# Patient Record
Sex: Female | Born: 1945 | Race: Black or African American | Hispanic: No | Smoking: Never smoker
Health system: Southern US, Community
[De-identification: ages and names within clinical notes are randomized; demographics above are authoritative.]

## PROBLEM LIST (undated history)

## (undated) DIAGNOSIS — I773 Arterial fibromuscular dysplasia: Secondary | ICD-10-CM

## (undated) DIAGNOSIS — I493 Ventricular premature depolarization: Secondary | ICD-10-CM

## (undated) DIAGNOSIS — I351 Nonrheumatic aortic (valve) insufficiency: Secondary | ICD-10-CM

## (undated) DIAGNOSIS — I071 Rheumatic tricuspid insufficiency: Secondary | ICD-10-CM

## (undated) DIAGNOSIS — K219 Gastro-esophageal reflux disease without esophagitis: Secondary | ICD-10-CM

## (undated) DIAGNOSIS — N183 Chronic kidney disease, stage 3 unspecified: Secondary | ICD-10-CM

## (undated) DIAGNOSIS — I10 Essential (primary) hypertension: Secondary | ICD-10-CM

## (undated) DIAGNOSIS — I2510 Atherosclerotic heart disease of native coronary artery without angina pectoris: Secondary | ICD-10-CM

## (undated) DIAGNOSIS — I7771 Dissection of carotid artery: Secondary | ICD-10-CM

## (undated) DIAGNOSIS — E785 Hyperlipidemia, unspecified: Secondary | ICD-10-CM

## (undated) DIAGNOSIS — I491 Atrial premature depolarization: Secondary | ICD-10-CM

## (undated) DIAGNOSIS — R011 Cardiac murmur, unspecified: Secondary | ICD-10-CM

## (undated) DIAGNOSIS — K648 Other hemorrhoids: Secondary | ICD-10-CM

## (undated) DIAGNOSIS — R600 Localized edema: Secondary | ICD-10-CM

## (undated) DIAGNOSIS — D709 Neutropenia, unspecified: Secondary | ICD-10-CM

## (undated) DIAGNOSIS — E669 Obesity, unspecified: Secondary | ICD-10-CM

## (undated) DIAGNOSIS — R0602 Shortness of breath: Secondary | ICD-10-CM

## (undated) DIAGNOSIS — R51 Headache: Secondary | ICD-10-CM

## (undated) DIAGNOSIS — I639 Cerebral infarction, unspecified: Secondary | ICD-10-CM

## (undated) DIAGNOSIS — I340 Nonrheumatic mitral (valve) insufficiency: Secondary | ICD-10-CM

## (undated) DIAGNOSIS — I495 Sick sinus syndrome: Secondary | ICD-10-CM

## (undated) HISTORY — DX: Essential (primary) hypertension: I10

## (undated) HISTORY — DX: Chronic kidney disease, stage 3 unspecified: N18.30

## (undated) HISTORY — DX: Sick sinus syndrome: I49.5

## (undated) HISTORY — DX: Neutropenia, unspecified: D70.9

## (undated) HISTORY — DX: Atherosclerotic heart disease of native coronary artery without angina pectoris: I25.10

## (undated) HISTORY — DX: Dissection of carotid artery: I77.71

## (undated) HISTORY — DX: Localized edema: R60.0

## (undated) HISTORY — DX: Rheumatic tricuspid insufficiency: I07.1

## (undated) HISTORY — DX: Atrial premature depolarization: I49.1

## (undated) HISTORY — DX: Arterial fibromuscular dysplasia: I77.3

## (undated) HISTORY — DX: Cerebral infarction, unspecified: I63.9

## (undated) HISTORY — PX: ABDOMINAL HYSTERECTOMY: SHX81

## (undated) HISTORY — DX: Other hemorrhoids: K64.8

## (undated) HISTORY — DX: Nonrheumatic mitral (valve) insufficiency: I34.0

## (undated) HISTORY — DX: Obesity, unspecified: E66.9

## (undated) HISTORY — DX: Nonrheumatic aortic (valve) insufficiency: I35.1

## (undated) HISTORY — PX: OTHER SURGICAL HISTORY: SHX169

## (undated) HISTORY — DX: Hyperlipidemia, unspecified: E78.5

## (undated) HISTORY — DX: Ventricular premature depolarization: I49.3

---

## 1995-06-12 ENCOUNTER — Encounter: Payer: Self-pay | Admitting: Internal Medicine

## 1998-09-30 ENCOUNTER — Encounter: Payer: Self-pay | Admitting: Emergency Medicine

## 1998-09-30 ENCOUNTER — Inpatient Hospital Stay (HOSPITAL_COMMUNITY): Admission: EM | Admit: 1998-09-30 | Discharge: 1998-10-01 | Payer: Self-pay | Admitting: Emergency Medicine

## 1998-10-05 ENCOUNTER — Encounter: Admission: RE | Admit: 1998-10-05 | Discharge: 1998-10-05 | Payer: Self-pay | Admitting: Internal Medicine

## 1998-11-23 ENCOUNTER — Encounter: Admission: RE | Admit: 1998-11-23 | Discharge: 1998-11-23 | Payer: Self-pay | Admitting: Family Medicine

## 1998-12-13 ENCOUNTER — Encounter: Admission: RE | Admit: 1998-12-13 | Discharge: 1998-12-13 | Payer: Self-pay | Admitting: Family Medicine

## 1999-01-10 ENCOUNTER — Encounter: Admission: RE | Admit: 1999-01-10 | Discharge: 1999-01-10 | Payer: Self-pay | Admitting: Family Medicine

## 1999-02-01 ENCOUNTER — Other Ambulatory Visit: Admission: RE | Admit: 1999-02-01 | Discharge: 1999-02-01 | Payer: Self-pay | Admitting: *Deleted

## 2001-03-08 ENCOUNTER — Encounter: Payer: Self-pay | Admitting: Emergency Medicine

## 2001-03-08 ENCOUNTER — Inpatient Hospital Stay (HOSPITAL_COMMUNITY): Admission: EM | Admit: 2001-03-08 | Discharge: 2001-03-17 | Payer: Self-pay | Admitting: Emergency Medicine

## 2001-03-09 ENCOUNTER — Encounter: Payer: Self-pay | Admitting: *Deleted

## 2001-03-14 ENCOUNTER — Encounter: Payer: Self-pay | Admitting: Neurology

## 2001-06-04 ENCOUNTER — Ambulatory Visit (HOSPITAL_COMMUNITY): Admission: RE | Admit: 2001-06-04 | Discharge: 2001-06-04 | Payer: Self-pay | Admitting: Neurology

## 2001-06-04 ENCOUNTER — Encounter: Payer: Self-pay | Admitting: Neurology

## 2001-07-01 ENCOUNTER — Other Ambulatory Visit: Admission: RE | Admit: 2001-07-01 | Discharge: 2001-07-01 | Payer: Self-pay | Admitting: *Deleted

## 2002-08-24 ENCOUNTER — Encounter: Payer: Self-pay | Admitting: Internal Medicine

## 2002-08-26 ENCOUNTER — Encounter: Payer: Self-pay | Admitting: Neurology

## 2002-08-26 ENCOUNTER — Ambulatory Visit (HOSPITAL_COMMUNITY): Admission: RE | Admit: 2002-08-26 | Discharge: 2002-08-26 | Payer: Self-pay | Admitting: Neurology

## 2003-04-02 ENCOUNTER — Encounter: Payer: Self-pay | Admitting: Internal Medicine

## 2003-06-11 ENCOUNTER — Inpatient Hospital Stay (HOSPITAL_COMMUNITY): Admission: EM | Admit: 2003-06-11 | Discharge: 2003-06-13 | Payer: Self-pay | Admitting: Emergency Medicine

## 2003-12-11 ENCOUNTER — Emergency Department (HOSPITAL_COMMUNITY): Admission: EM | Admit: 2003-12-11 | Discharge: 2003-12-11 | Payer: Self-pay | Admitting: Emergency Medicine

## 2005-10-27 ENCOUNTER — Inpatient Hospital Stay (HOSPITAL_COMMUNITY): Admission: RE | Admit: 2005-10-27 | Discharge: 2005-10-29 | Payer: Self-pay | Admitting: *Deleted

## 2006-01-16 ENCOUNTER — Observation Stay (HOSPITAL_COMMUNITY): Admission: EM | Admit: 2006-01-16 | Discharge: 2006-01-16 | Payer: Self-pay | Admitting: Emergency Medicine

## 2006-01-19 ENCOUNTER — Ambulatory Visit (HOSPITAL_COMMUNITY): Admission: RE | Admit: 2006-01-19 | Discharge: 2006-01-20 | Payer: Self-pay | Admitting: Cardiology

## 2006-02-01 ENCOUNTER — Encounter (HOSPITAL_COMMUNITY): Admission: RE | Admit: 2006-02-01 | Discharge: 2006-05-02 | Payer: Self-pay | Admitting: Cardiology

## 2006-02-20 HISTORY — PX: CARDIAC CATHETERIZATION: SHX172

## 2006-02-20 HISTORY — PX: OTHER SURGICAL HISTORY: SHX169

## 2006-05-03 ENCOUNTER — Encounter (HOSPITAL_COMMUNITY): Admission: RE | Admit: 2006-05-03 | Discharge: 2006-06-10 | Payer: Self-pay | Admitting: Cardiology

## 2006-06-12 ENCOUNTER — Encounter (HOSPITAL_COMMUNITY): Admission: RE | Admit: 2006-06-12 | Discharge: 2006-09-10 | Payer: Self-pay | Admitting: Cardiology

## 2006-09-21 ENCOUNTER — Encounter (HOSPITAL_COMMUNITY): Admission: RE | Admit: 2006-09-21 | Discharge: 2006-12-20 | Payer: Self-pay | Admitting: Cardiology

## 2006-12-01 ENCOUNTER — Emergency Department (HOSPITAL_COMMUNITY): Admission: EM | Admit: 2006-12-01 | Discharge: 2006-12-01 | Payer: Self-pay | Admitting: Emergency Medicine

## 2006-12-22 ENCOUNTER — Encounter (HOSPITAL_COMMUNITY): Admission: RE | Admit: 2006-12-22 | Discharge: 2007-02-19 | Payer: Self-pay | Admitting: Cardiology

## 2007-01-30 ENCOUNTER — Ambulatory Visit (HOSPITAL_COMMUNITY): Admission: RE | Admit: 2007-01-30 | Discharge: 2007-01-31 | Payer: Self-pay | Admitting: Cardiology

## 2007-02-21 ENCOUNTER — Encounter (HOSPITAL_COMMUNITY): Admission: RE | Admit: 2007-02-21 | Discharge: 2007-03-22 | Payer: Self-pay | Admitting: Cardiology

## 2007-03-24 ENCOUNTER — Encounter (HOSPITAL_COMMUNITY): Admission: RE | Admit: 2007-03-24 | Discharge: 2007-06-22 | Payer: Self-pay | Admitting: Cardiology

## 2007-06-23 ENCOUNTER — Encounter (HOSPITAL_COMMUNITY): Admission: RE | Admit: 2007-06-23 | Discharge: 2007-09-21 | Payer: Self-pay | Admitting: Cardiology

## 2007-09-22 ENCOUNTER — Encounter (HOSPITAL_COMMUNITY): Admission: RE | Admit: 2007-09-22 | Discharge: 2007-12-21 | Payer: Self-pay | Admitting: Cardiology

## 2007-12-23 ENCOUNTER — Encounter (HOSPITAL_COMMUNITY): Admission: RE | Admit: 2007-12-23 | Discharge: 2008-03-22 | Payer: Self-pay | Admitting: Cardiology

## 2008-02-21 HISTORY — PX: COLONOSCOPY: SHX174

## 2008-03-23 ENCOUNTER — Encounter (HOSPITAL_COMMUNITY): Admission: RE | Admit: 2008-03-23 | Discharge: 2008-06-21 | Payer: Self-pay | Admitting: Cardiology

## 2008-05-15 DIAGNOSIS — K589 Irritable bowel syndrome without diarrhea: Secondary | ICD-10-CM

## 2008-05-15 DIAGNOSIS — D709 Neutropenia, unspecified: Secondary | ICD-10-CM

## 2008-05-15 DIAGNOSIS — K648 Other hemorrhoids: Secondary | ICD-10-CM | POA: Insufficient documentation

## 2008-05-19 ENCOUNTER — Ambulatory Visit: Payer: Self-pay | Admitting: Internal Medicine

## 2008-05-19 DIAGNOSIS — I2510 Atherosclerotic heart disease of native coronary artery without angina pectoris: Secondary | ICD-10-CM

## 2008-05-29 ENCOUNTER — Ambulatory Visit (HOSPITAL_COMMUNITY): Admission: RE | Admit: 2008-05-29 | Discharge: 2008-05-29 | Payer: Self-pay | Admitting: Internal Medicine

## 2008-05-29 ENCOUNTER — Ambulatory Visit: Payer: Self-pay | Admitting: Internal Medicine

## 2008-06-22 ENCOUNTER — Encounter (HOSPITAL_COMMUNITY): Admission: RE | Admit: 2008-06-22 | Discharge: 2008-09-20 | Payer: Self-pay | Admitting: Cardiology

## 2008-09-21 ENCOUNTER — Encounter (HOSPITAL_COMMUNITY): Admission: RE | Admit: 2008-09-21 | Discharge: 2008-12-20 | Payer: Self-pay | Admitting: Cardiology

## 2008-12-21 ENCOUNTER — Encounter (HOSPITAL_COMMUNITY): Admission: RE | Admit: 2008-12-21 | Discharge: 2009-03-21 | Payer: Self-pay | Admitting: Cardiology

## 2009-03-23 ENCOUNTER — Encounter (HOSPITAL_COMMUNITY): Admission: RE | Admit: 2009-03-23 | Discharge: 2009-06-21 | Payer: Self-pay | Admitting: Cardiology

## 2009-06-22 ENCOUNTER — Encounter (HOSPITAL_COMMUNITY): Admission: RE | Admit: 2009-06-22 | Discharge: 2009-09-20 | Payer: Self-pay | Admitting: Cardiology

## 2009-09-21 ENCOUNTER — Encounter (HOSPITAL_COMMUNITY): Admission: RE | Admit: 2009-09-21 | Discharge: 2009-12-20 | Payer: Self-pay | Admitting: Cardiology

## 2009-12-21 ENCOUNTER — Encounter (HOSPITAL_COMMUNITY)
Admission: RE | Admit: 2009-12-21 | Discharge: 2010-03-21 | Payer: Self-pay | Source: Home / Self Care | Attending: Internal Medicine | Admitting: Internal Medicine

## 2010-02-04 ENCOUNTER — Encounter: Payer: Self-pay | Admitting: Internal Medicine

## 2010-02-04 ENCOUNTER — Inpatient Hospital Stay (HOSPITAL_COMMUNITY)
Admission: EM | Admit: 2010-02-04 | Discharge: 2010-02-08 | Payer: Self-pay | Source: Home / Self Care | Attending: Internal Medicine | Admitting: Internal Medicine

## 2010-02-07 ENCOUNTER — Encounter (INDEPENDENT_AMBULATORY_CARE_PROVIDER_SITE_OTHER): Payer: Self-pay | Admitting: Internal Medicine

## 2010-03-17 ENCOUNTER — Encounter: Payer: Self-pay | Admitting: Internal Medicine

## 2010-03-17 ENCOUNTER — Encounter (INDEPENDENT_AMBULATORY_CARE_PROVIDER_SITE_OTHER): Payer: Self-pay | Admitting: *Deleted

## 2010-03-17 ENCOUNTER — Ambulatory Visit
Admission: RE | Admit: 2010-03-17 | Discharge: 2010-03-17 | Payer: Self-pay | Source: Home / Self Care | Attending: Internal Medicine | Admitting: Internal Medicine

## 2010-03-17 DIAGNOSIS — E785 Hyperlipidemia, unspecified: Secondary | ICD-10-CM | POA: Insufficient documentation

## 2010-03-22 ENCOUNTER — Ambulatory Visit
Admission: RE | Admit: 2010-03-22 | Discharge: 2010-03-22 | Payer: Self-pay | Source: Home / Self Care | Attending: Internal Medicine | Admitting: Internal Medicine

## 2010-03-22 ENCOUNTER — Other Ambulatory Visit: Payer: Self-pay

## 2010-03-22 DIAGNOSIS — E7800 Pure hypercholesterolemia, unspecified: Secondary | ICD-10-CM | POA: Insufficient documentation

## 2010-03-22 LAB — LIPID PANEL
Cholesterol: 143 mg/dL (ref 0–200)
HDL: 62.6 mg/dL (ref >39.00)
LDL Cholesterol: 72 mg/dL (ref 0–99)
Total CHOL/HDL Ratio: 2
Triglycerides: 44 mg/dL (ref 0.0–149.0)
VLDL: 8.8 mg/dL (ref 0.0–40.0)

## 2010-03-22 LAB — HEPATIC FUNCTION PANEL
ALT: 17 U/L (ref 0–35)
AST: 25 U/L (ref 0–37)
Albumin: 3.8 g/dL (ref 3.5–5.2)
Alkaline Phosphatase: 71 U/L (ref 39–117)
Bilirubin, Direct: 0.2 mg/dL (ref 0.0–0.3)
Total Bilirubin: 1 mg/dL (ref 0.3–1.2)
Total Protein: 7.1 g/dL (ref 6.0–8.3)

## 2010-03-22 LAB — BASIC METABOLIC PANEL
BUN: 19 mg/dL (ref 6–23)
CO2: 31 mEq/L (ref 19–32)
Calcium: 9.2 mg/dL (ref 8.4–10.5)
Chloride: 104 mEq/L (ref 96–112)
Creatinine, Ser: 1 mg/dL (ref 0.4–1.2)
GFR: 70.68 mL/min (ref >60.00)
Glucose, Bld: 92 mg/dL (ref 70–99)
Potassium: 4.1 mEq/L (ref 3.5–5.1)
Sodium: 141 mEq/L (ref 135–145)

## 2010-03-23 ENCOUNTER — Encounter (HOSPITAL_COMMUNITY): Payer: Self-pay | Attending: Internal Medicine

## 2010-03-23 DIAGNOSIS — I10 Essential (primary) hypertension: Secondary | ICD-10-CM | POA: Insufficient documentation

## 2010-03-23 DIAGNOSIS — Z9861 Coronary angioplasty status: Secondary | ICD-10-CM | POA: Insufficient documentation

## 2010-03-23 DIAGNOSIS — E785 Hyperlipidemia, unspecified: Secondary | ICD-10-CM | POA: Insufficient documentation

## 2010-03-23 DIAGNOSIS — I2510 Atherosclerotic heart disease of native coronary artery without angina pectoris: Secondary | ICD-10-CM | POA: Insufficient documentation

## 2010-03-23 DIAGNOSIS — I209 Angina pectoris, unspecified: Secondary | ICD-10-CM | POA: Insufficient documentation

## 2010-03-23 DIAGNOSIS — Z5189 Encounter for other specified aftercare: Secondary | ICD-10-CM | POA: Insufficient documentation

## 2010-03-24 ENCOUNTER — Encounter: Payer: Self-pay | Admitting: Internal Medicine

## 2010-03-24 NOTE — Miscellaneous (Signed)
Summary: Physician Response to Notification of Patient Event   Physician Response to Notification of Patient Event   Imported By: Roderic Ovens 03/10/2010 10:52:15  _____________________________________________________________________  External Attachment:    Type:   Image     Comment:   External Document

## 2010-03-24 NOTE — Progress Notes (Signed)
Summary: [facility] Progress Note   [facility] Progress Note   Imported By: Roderic Ovens 03/10/2010 15:31:19  _____________________________________________________________________  External Attachment:    Type:   Image     Comment:   External Document

## 2010-03-24 NOTE — Letter (Signed)
Summary: Generic Letter  Architectural technologist, Main Office  [address]   [place], [place] [address]   Phone: [phone]  Fax: [phone]        March 17, 2010 MRN: [id number]    [facility] [address] [place], [place]  [address]    Attn: [facility]  Ms. Plemmons was seen in the office today and she may resume cardiac rehab now without restrictions. If you have any questions please feel free to contact our office.         Sincerely,  Arvilla Meres, MD Sherri Rad, RN, BSN  This letter has been electronically signed by your physician.

## 2010-03-25 ENCOUNTER — Encounter (HOSPITAL_COMMUNITY): Payer: Self-pay

## 2010-03-28 ENCOUNTER — Encounter (HOSPITAL_COMMUNITY): Payer: Self-pay

## 2010-03-30 ENCOUNTER — Encounter (HOSPITAL_COMMUNITY): Payer: Self-pay

## 2010-03-30 NOTE — Miscellaneous (Signed)
Summary: Orders Update  Clinical Lists Changes  Orders: Added new Test order of TLB-Lipid Panel (80061-LIPID) - Signed Added new Test order of TLB-BMP (Basic Metabolic Panel-BMET) (80048-METABOL) - Signed Added new Test order of TLB-Hepatic/Liver Function Pnl (80076-HEPATIC) - Signed

## 2010-03-30 NOTE — Letter (Signed)
Summary: Custom - Lipid  [facility], Main Office  [address]   [place], [place] [address]   Phone: [phone]  Fax: [phone]     March 24, 2010 MRN: [id number]   [facility] [address] [place], [place]  [address]   Dear Ms. Stefanik,  We have reviewed your cholesterol results.  They are as follows:     Total Cholesterol:    143 (Desirable: less than 200)       HDL  Cholesterol:     62.60  (Desirable: greater than 40 for men and 50 for women)       LDL Cholesterol:       72  (Desirable: less than 100 for low risk and less than 70 for moderate to high risk)       Triglycerides:       44.0  (Desirable: less than 150)  Our recommendations include:  Looks Good, continue current medications.   Call our office at the number listed above if you have any questions.  Lowering your LDL cholesterol is important, but it is only one of a large number of "risk factors" that may indicate that you are at risk for heart disease, stroke or other complications of hardening of the arteries.  Other risk factors include:   A.  Cigarette Smoking* B.  High Blood Pressure* C.  Obesity* D.   Low HDL Cholesterol (see yours above)* E.   Diabetes Mellitus (higher risk if your is uncontrolled) F.  Family history of premature heart disease G.  Previous history of stroke or cardiovascular disease    *These are risk factors YOU HAVE CONTROL OVER.  For more information, visit .  There is now evidence that lowering the TOTAL CHOLESTEROL AND LDL CHOLESTEROL can reduce the risk of heart disease.  The American Heart Association recommends the following guidelines for the treatment of elevated cholesterol:  1.  If there is now current heart disease and less than two risk factors, TOTAL CHOLESTEROL should be less than 200 and LDL CHOLESTEROL should be less than 100. 2.  If there is current heart disease or two or more risk factors, TOTAL CHOLESTEROL should be less than 200 and  LDL CHOLESTEROL should be less than 70.  A diet low in cholesterol, saturated fat, and calories is the cornerstone of treatment for elevated cholesterol.  Cessation of smoking and exercise are also important in the management of elevated cholesterol and preventing vascular disease.  Studies have shown that 30 to 60 minutes of physical activity most days can help lower blood pressure, lower cholesterol, and keep your weight at a healthy level.  Drug therapy is used when cholesterol levels do not respond to therapeutic lifestyle changes (smoking cessation, diet, and exercise) and remains unacceptably high.  If medication is started, it is important to have you levels checked periodically to evaluate the need for further treatment options.  Thank you,    [employer] Team

## 2010-03-30 NOTE — Assessment & Plan Note (Signed)
Summary: eph   Primary Provider:  Juline Patch MD   History of Present Illness: [name] is a delightful 65 year old  woman with a history of hypertension, hyperlipidemia, fibromuscular   dysplasia of the intracranial vessels, and previous left internal   carotid artery dissection.  She also has a history of coronary artery   disease.  She is status post previous stenting to the LAD and diagonal.   Most recently, she had a Promus drug-eluting stent to the second  diagonal in December 2008. She has followed Dr. Aleen Campi, but since his  retirement has not reestablished with another cardiologist.    Was in cardiac rehab in December 2011 and experienced CP. Took NTG and then had a presyncopal episode with SBP 80. With this developed transiet L facial dropp thought to be due to cerebral hypoperfusion. CT/MRI normal. R/o for MI with serial cardiac markers. Lexiscan Myoview 12/11 showed EF 72% with normal perfusion. Returns for f/u.   Has not returned to CR yet as she is waiting for my clearance. Feels great. No further CP or neuro symptoms. Staying active around the house. No HF symtpoms. Has not had lipids checked recently.   Current Medications (verified): 1)  Plavix 75 Mg Tabs (Clopidogrel Bisulfate) .Marland Kitchen.. 1 By Mouth Once Daily 2)  Pravastatin Sodium 40 Mg Tabs (Pravastatin Sodium) .Marland Kitchen.. 1 By Mouth Once Daily 3)  Aspirin 325 Mg  Tabs (Aspirin) .Marland Kitchen.. 1 By Mouth Daily 4)  Nitrostat 0.3 Mg Subl (Nitroglycerin) .... As Needed 5)  Vitamin D3 1000 Unit Tabs (Cholecalciferol) .Marland Kitchen.. 1 By Mouth Daily 6)  Diovan .Marland Kitchen.. 1 By Mouth Daily  Allergies (verified): 1)  ! * Nalofon  Past History:  Past Medical History: 1. IBS 2. Neutropenia Unspecified 3. Coronary artery disease.       a.     Status post previous stenting of the left anterior        descending.       b.     Last cardiac catheterization was in December 2008, left        anterior descending.  There was no evidence of in-stent   restenosis.  There was a 50% lesion in the circumflex.  The right        coronary artery was normal and the ejection fraction of 70%.        There was a 95% lesion in the second diagonal which was stented        with a Promus drug-eluting stent.       c. Lexiscan Myoview 12/11: EF 72%. Normal perfusion 4. History of transient ischemic attack.       a.     History of left internal carotid artery dissection.       b.     History of fibromuscular dysplasia.  5. Hypertension.  6. Hyperlipidemia.      Review of Systems       As per HPI and past medical history; otherwise all systems negative.   Vital Signs:  Patient profile:   65 year old female Height:      68 inches Weight:      220.75 pounds BMI:     33.69 Pulse rate:   55 / minute Resp:     16 per minute BP sitting:   102 / 66  (right arm)  Vitals Entered By: Marrion Coy, CNA (March 17, 2010 11:29 AM)  Physical Exam  General:  Well appearing. no resp difficulty HEENT: normal Neck: supple. no  JVD. Carotids 2+ bilat; no bruits. No lymphadenopathy or thryomegaly appreciated. Cor: PMI nondisplaced. Regular rate & rhythm. 2/6 SEM RSB s2 well preserved.  Lungs: clear Abdomen: soft, nontender, nondistended. No hepatosplenomegaly. No bruits or masses. Good bowel sounds. Extremities: no cyanosis, clubbing, rash. tr edema Neuro: alert & orientedx3, cranial nerves grossly intact. moves all 4 extremities w/o difficulty. affect pleasant    Impression & Recommendations:  Problem # 1:  CAD (ICD-414.00) Doing well. No angina. Stress test reassuring. will refer back to cardiac rehab. Told to be careful taking NTG and to sit down before she takes it, if needed. If CP recurs may need cath.   Problem # 2:  HYPERLIPIDEMIA-MIXED (ICD-272.4) Goal LDL < 70. Will check lipids and liver panel. Titrate statin as needed to get LDL to goal.   CHF Assessment/Plan:      The patient's current weight is 220.75 pounds.  Her previous weight was 225  pounds.     Patient Instructions: 1)  Return for FASTING labwork next week: bmet/lipid/liver (414.01;272.1). 2)  You may resume cardiac rehab without restrictions. 3)  Your physician wants you to follow-up in: 6 months.  You will receive a reminder letter in the mail two months in advance. If you don't receive a letter, please call our office to schedule the follow-up appointment. 4)  Your physician recommends that you continue on your current medications as directed. Please refer to the Current Medication list given to you today.

## 2010-04-01 ENCOUNTER — Encounter (HOSPITAL_COMMUNITY): Payer: Self-pay

## 2010-04-04 ENCOUNTER — Encounter (HOSPITAL_COMMUNITY): Payer: Self-pay

## 2010-04-06 ENCOUNTER — Encounter (HOSPITAL_COMMUNITY): Payer: Self-pay

## 2010-04-06 NOTE — Consult Note (Signed)
  NAMECHLOE, [name]                ACCOUNT NO.:  [id number]  MEDICAL RECORD NO.:  [id number]          PATIENT TYPE:  EMS  LOCATION:  MAJO                         FACILITY:  [facility]  PHYSICIAN:  Melvyn Novas, M.D.  DATE OF BIRTH:  1945/11/13  DATE OF CONSULTATION:  02/04/2010 DATE OF DISCHARGE:                                CONSULTATION   REASON FOR CONSULTATION:  Left-sided facial droop.  HISTORY OF PRESENT ILLNESS:  The patient is a 65 year old African American female with a past medical history of coronary artery disease status post stenting, hypertension, hyperlipidemia, a history of stroke, left internal carotid artery dissection, possible fibromuscular dysplasia who was sent to the ED from cardiac rehab for chest pain and a right facial droop.    The patient was participating in cardiac rehab at 11:56 a.m. At that time the patient started to have the chest pain.  Then she was given nitroglycerin sublingual.  Then she started to collapse with hypotension and bradycardia.   Her systolic Hart pressure was 80 and her heart rate was 40.  Then a code blue and stroke code was initiated, and she was given IV fluids.  Then she quickly recovered.  She was alert and oriented but felt very dizzy, and also the nurse noted that the patient had a left-sided facial droop, so she was sent to the ED for further evaluation.   We will ask for a consult for the questionable stroke. When we saw the patient in the ED she was alert and oriented, and she had no weakness, no palpitations or chest pain.   She had no blurry vision or headache.  PAST MEDICAL HISTORY: 1. Hypertension. 2. Hyperlipidemia. 3. History of cerebrovascular accident in 2003 and also had a heart     attack. 4. Heart attack in 2007. 5. Coronary artery disease, status post stenting. 6. Anemia with leukopenia. 7. History of left sided stroke .   Patient will receive fluids for hypotension, watch for  bradycardia.  I  have seen only a very mild left facial droop and no mental status changes, no peripheral weakness, no sensory loss.  Plan to obtaine brain MRI and if negative admit to hospitalist as TIA, hypotension.       Levert Feinstein, MD   ______________________________ Melvyn Novas, M.D.    [name]  D:  02/04/2010  T:  02/04/2010  Job:  [id number]  Electronically Signed by Melvyn Novas M.D. on 03/04/2010 11:43:13 AM Electronically Signed by Melvyn Novas M.D. on 04/06/2010 09:29:56 AM

## 2010-04-08 ENCOUNTER — Encounter (HOSPITAL_COMMUNITY): Payer: Self-pay

## 2010-04-11 ENCOUNTER — Encounter (HOSPITAL_COMMUNITY): Payer: Self-pay

## 2010-04-13 ENCOUNTER — Encounter (HOSPITAL_COMMUNITY): Payer: Self-pay

## 2010-04-15 ENCOUNTER — Encounter (HOSPITAL_COMMUNITY): Payer: Self-pay

## 2010-04-18 ENCOUNTER — Encounter (HOSPITAL_COMMUNITY): Payer: Self-pay

## 2010-04-20 ENCOUNTER — Encounter (HOSPITAL_COMMUNITY): Payer: Self-pay

## 2010-04-22 ENCOUNTER — Encounter (HOSPITAL_COMMUNITY): Payer: Self-pay

## 2010-04-25 ENCOUNTER — Encounter (HOSPITAL_COMMUNITY): Payer: Self-pay

## 2010-04-27 ENCOUNTER — Encounter (HOSPITAL_COMMUNITY): Payer: Self-pay

## 2010-04-29 ENCOUNTER — Encounter (HOSPITAL_COMMUNITY): Payer: Self-pay

## 2010-05-02 ENCOUNTER — Encounter (HOSPITAL_COMMUNITY): Payer: Self-pay | Attending: Internal Medicine

## 2010-05-02 DIAGNOSIS — I2510 Atherosclerotic heart disease of native coronary artery without angina pectoris: Secondary | ICD-10-CM | POA: Insufficient documentation

## 2010-05-02 DIAGNOSIS — E785 Hyperlipidemia, unspecified: Secondary | ICD-10-CM | POA: Insufficient documentation

## 2010-05-02 DIAGNOSIS — I209 Angina pectoris, unspecified: Secondary | ICD-10-CM | POA: Insufficient documentation

## 2010-05-02 DIAGNOSIS — I10 Essential (primary) hypertension: Secondary | ICD-10-CM | POA: Insufficient documentation

## 2010-05-02 DIAGNOSIS — Z9861 Coronary angioplasty status: Secondary | ICD-10-CM | POA: Insufficient documentation

## 2010-05-02 DIAGNOSIS — Z5189 Encounter for other specified aftercare: Secondary | ICD-10-CM | POA: Insufficient documentation

## 2010-05-02 LAB — URINALYSIS, ROUTINE W REFLEX MICROSCOPIC
Hgb urine dipstick: NEGATIVE
Ketones, ur: NEGATIVE mg/dL
Protein, ur: NEGATIVE mg/dL
Urobilinogen, UA: 0.2 mg/dL (ref 0.0–1.0)

## 2010-05-02 LAB — GLUCOSE, CAPILLARY
Glucose-Capillary: 101 mg/dL — ABNORMAL HIGH (ref 70–99)
Glucose-Capillary: 106 mg/dL — ABNORMAL HIGH (ref 70–99)
Glucose-Capillary: 119 mg/dL — ABNORMAL HIGH (ref 70–99)
Glucose-Capillary: 121 mg/dL — ABNORMAL HIGH (ref 70–99)
Glucose-Capillary: 122 mg/dL — ABNORMAL HIGH (ref 70–99)
Glucose-Capillary: 160 mg/dL — ABNORMAL HIGH (ref 70–99)
Glucose-Capillary: 89 mg/dL (ref 70–99)

## 2010-05-02 LAB — LIPID PANEL
Cholesterol: 123 mg/dL (ref 0–200)
HDL: 51 mg/dL (ref >39)
Total CHOL/HDL Ratio: 2.4 RATIO

## 2010-05-02 LAB — COMPREHENSIVE METABOLIC PANEL
ALT: 14 U/L (ref 0–35)
AST: 22 U/L (ref 0–37)
Albumin: 3.3 g/dL — ABNORMAL LOW (ref 3.5–5.2)
Alkaline Phosphatase: 66 U/L (ref 39–117)
BUN: 21 mg/dL (ref 6–23)
CO2: 27 mEq/L (ref 19–32)
Calcium: 9 mg/dL (ref 8.4–10.5)
Chloride: 108 mEq/L (ref 96–112)
Creatinine, Ser: 1.12 mg/dL (ref 0.4–1.2)
GFR calc Af Amer: 59 mL/min — ABNORMAL LOW (ref >60)
GFR calc non Af Amer: 49 mL/min — ABNORMAL LOW (ref >60)
Glucose, Bld: 92 mg/dL (ref 70–99)
Potassium: 3.7 mEq/L (ref 3.5–5.1)
Sodium: 141 mEq/L (ref 135–145)
Total Bilirubin: 1.1 mg/dL (ref 0.3–1.2)
Total Protein: 6.6 g/dL (ref 6.0–8.3)

## 2010-05-02 LAB — DIFFERENTIAL
Basophils Absolute: 0 10*3/uL (ref 0.0–0.1)
Basophils Relative: 1 % (ref 0–1)
Eosinophils Absolute: 0 10*3/uL (ref 0.0–0.7)
Eosinophils Relative: 1 % (ref 0–5)
Lymphocytes Relative: 47 % — ABNORMAL HIGH (ref 12–46)
Lymphs Abs: 1.3 10*3/uL (ref 0.7–4.0)
Monocytes Absolute: 0.3 10*3/uL (ref 0.1–1.0)
Monocytes Relative: 11 % (ref 3–12)
Neutro Abs: 1.2 10*3/uL — ABNORMAL LOW (ref 1.7–7.7)
Neutrophils Relative %: 41 % — ABNORMAL LOW (ref 43–77)

## 2010-05-02 LAB — TROPONIN I: Troponin I: 0.01 ng/mL (ref 0.00–0.06)

## 2010-05-02 LAB — CBC
HCT: 32.6 % — ABNORMAL LOW (ref 36.0–46.0)
Hemoglobin: 11.1 g/dL — ABNORMAL LOW (ref 12.0–15.0)
MCH: 29.1 pg (ref 26.0–34.0)
MCH: 29.2 pg (ref 26.0–34.0)
MCHC: 33.9 g/dL (ref 30.0–36.0)
MCHC: 34 g/dL (ref 30.0–36.0)
MCV: 85.6 fL (ref 78.0–100.0)
MCV: 85.8 fL (ref 78.0–100.0)
Platelets: 167 10*3/uL (ref 150–400)
Platelets: 173 10*3/uL (ref 150–400)
RBC: 3.8 MIL/uL — ABNORMAL LOW (ref 3.87–5.11)
RDW: 14.3 % (ref 11.5–15.5)
RDW: 14.4 % (ref 11.5–15.5)
WBC: 2.7 10*3/uL — ABNORMAL LOW (ref 4.0–10.5)
WBC: 2.8 10*3/uL — ABNORMAL LOW (ref 4.0–10.5)

## 2010-05-02 LAB — CK TOTAL AND CKMB (NOT AT ARMC)
CK, MB: 1.3 ng/mL (ref 0.3–4.0)
Relative Index: 1.1 (ref 0.0–2.5)
Total CK: 120 U/L (ref 7–177)

## 2010-05-02 LAB — CARDIAC PANEL(CRET KIN+CKTOT+MB+TROPI)
CK, MB: 1.3 ng/mL (ref 0.3–4.0)
Relative Index: 1.2 (ref 0.0–2.5)
Troponin I: 0.01 ng/mL (ref 0.00–0.06)

## 2010-05-02 LAB — PROTIME-INR: Prothrombin Time: 13.8 seconds (ref 11.6–15.2)

## 2010-05-04 ENCOUNTER — Encounter (HOSPITAL_COMMUNITY): Payer: Self-pay

## 2010-05-06 ENCOUNTER — Encounter (HOSPITAL_COMMUNITY): Payer: Self-pay

## 2010-05-09 ENCOUNTER — Encounter (HOSPITAL_COMMUNITY): Payer: Self-pay

## 2010-05-11 ENCOUNTER — Encounter (HOSPITAL_COMMUNITY): Payer: Self-pay

## 2010-05-13 ENCOUNTER — Encounter (HOSPITAL_COMMUNITY): Payer: Self-pay

## 2010-05-16 ENCOUNTER — Encounter (HOSPITAL_COMMUNITY): Payer: Self-pay

## 2010-05-18 ENCOUNTER — Encounter (HOSPITAL_COMMUNITY): Payer: Self-pay

## 2010-05-20 ENCOUNTER — Encounter (HOSPITAL_COMMUNITY): Payer: Self-pay

## 2010-05-23 ENCOUNTER — Encounter (HOSPITAL_COMMUNITY): Payer: Self-pay | Attending: Internal Medicine

## 2010-05-23 DIAGNOSIS — Z9861 Coronary angioplasty status: Secondary | ICD-10-CM | POA: Insufficient documentation

## 2010-05-23 DIAGNOSIS — Z5189 Encounter for other specified aftercare: Secondary | ICD-10-CM | POA: Insufficient documentation

## 2010-05-23 DIAGNOSIS — E785 Hyperlipidemia, unspecified: Secondary | ICD-10-CM | POA: Insufficient documentation

## 2010-05-23 DIAGNOSIS — I10 Essential (primary) hypertension: Secondary | ICD-10-CM | POA: Insufficient documentation

## 2010-05-23 DIAGNOSIS — I209 Angina pectoris, unspecified: Secondary | ICD-10-CM | POA: Insufficient documentation

## 2010-05-23 DIAGNOSIS — I2510 Atherosclerotic heart disease of native coronary artery without angina pectoris: Secondary | ICD-10-CM | POA: Insufficient documentation

## 2010-05-25 ENCOUNTER — Encounter (HOSPITAL_COMMUNITY): Payer: Self-pay

## 2010-05-27 ENCOUNTER — Encounter (HOSPITAL_COMMUNITY): Payer: Self-pay

## 2010-05-30 ENCOUNTER — Encounter (HOSPITAL_COMMUNITY): Payer: Self-pay

## 2010-06-01 ENCOUNTER — Encounter (HOSPITAL_COMMUNITY): Payer: Self-pay

## 2010-06-03 ENCOUNTER — Encounter (HOSPITAL_COMMUNITY): Payer: Self-pay

## 2010-06-06 ENCOUNTER — Encounter (HOSPITAL_COMMUNITY): Payer: Self-pay

## 2010-06-08 ENCOUNTER — Encounter (HOSPITAL_COMMUNITY): Payer: Self-pay

## 2010-06-10 ENCOUNTER — Encounter (HOSPITAL_COMMUNITY): Payer: Self-pay

## 2010-06-13 ENCOUNTER — Encounter (HOSPITAL_COMMUNITY): Payer: Self-pay

## 2010-06-15 ENCOUNTER — Encounter (HOSPITAL_COMMUNITY): Payer: Self-pay

## 2010-06-17 ENCOUNTER — Encounter (HOSPITAL_COMMUNITY): Payer: Self-pay

## 2010-06-20 ENCOUNTER — Encounter (HOSPITAL_COMMUNITY): Payer: Self-pay

## 2010-06-22 ENCOUNTER — Encounter (HOSPITAL_COMMUNITY): Payer: Self-pay | Attending: Internal Medicine

## 2010-06-22 DIAGNOSIS — I2510 Atherosclerotic heart disease of native coronary artery without angina pectoris: Secondary | ICD-10-CM | POA: Insufficient documentation

## 2010-06-22 DIAGNOSIS — Z9861 Coronary angioplasty status: Secondary | ICD-10-CM | POA: Insufficient documentation

## 2010-06-22 DIAGNOSIS — I10 Essential (primary) hypertension: Secondary | ICD-10-CM | POA: Insufficient documentation

## 2010-06-22 DIAGNOSIS — Z5189 Encounter for other specified aftercare: Secondary | ICD-10-CM | POA: Insufficient documentation

## 2010-06-22 DIAGNOSIS — I209 Angina pectoris, unspecified: Secondary | ICD-10-CM | POA: Insufficient documentation

## 2010-06-22 DIAGNOSIS — E785 Hyperlipidemia, unspecified: Secondary | ICD-10-CM | POA: Insufficient documentation

## 2010-06-24 ENCOUNTER — Encounter (HOSPITAL_COMMUNITY): Payer: Self-pay

## 2010-06-27 ENCOUNTER — Encounter (HOSPITAL_COMMUNITY): Payer: Self-pay

## 2010-06-29 ENCOUNTER — Encounter (HOSPITAL_COMMUNITY): Payer: Self-pay

## 2010-07-01 ENCOUNTER — Encounter (HOSPITAL_COMMUNITY): Payer: Self-pay

## 2010-07-04 ENCOUNTER — Encounter (HOSPITAL_COMMUNITY): Payer: Self-pay

## 2010-07-05 NOTE — Cardiovascular Report (Signed)
[name], [name]                ACCOUNT NO.:  [id number]   MEDICAL RECORD NO.:  [id number]          PATIENT TYPE:  INP   LOCATION:  6533                         FACILITY:  [facility]   PHYSICIAN:  Antionette Char, MD    DATE OF BIRTH:  1946/01/28   DATE OF PROCEDURE:  01/30/2007  DATE OF DISCHARGE:                            CARDIAC CATHETERIZATION   PROCEDURES:  1. Left heart catheterization.  2. Coronary cineangiography.  3. Left ventricular angiography.   INDICATION FOR PROCEDURES:  This 65 year old female has a history of  single-vessel coronary artery disease involving her mid LAD and takeoff  of the first large diagonal branch.  She has had two stents placed  within the LAD which have had good results.  However, she had a stent  placed in the large diagonal branch at its ostium and this restenosed in  November 2007.  This was reopened using a noncompliant balloon with good  results and she was stable until several months ago when she again began  having episodes of chest pain.  The episodes of chest pain increased  recently and she underwent another treadmill exercise tolerance test at  which time it again was positive showing her to be at high risk for a  cardiac event.  With her past history of restenosis within her stent,  she was rescheduled for another cardiac catheterization.   PROCEDURE:  After signing an informed consent the patient was  premedicated with 5 mg of Valium by mouth and brought to the cardiac  catheterization laboratory at [facility].  Her right groin was  prepped and draped in a sterile fashion and anesthetized locally with 1%  lidocaine.  A 6-French introducer sheath was inserted percutaneously  into the right femoral artery; 6-French #4 Judkins coronary catheters  were used to make injections into the coronary arteries.  A 6-French  pigtail catheter was used to measure pressures in the left ventricle and  aorta and to make a midstream injection  into the left ventricle.  The  patient tolerated the procedure well and no complications were noted.  At the end of the procedure we reviewed our findings with Dr. Yates Decamp,  specifically with the finding of restenosis again within the diagonal  branch stented area.  This is a large diagonal branch and we felt that  it is the etiology of her chest pain and abnormal cardiac stress test.  After discussing this with the patient, Dr. Jacinto Halim proceeded with an  angioplasty procedure on the restenotic area.   CINE FINDINGS:  Coronary cineangiography:  1. Left coronary artery:  The ostium and left main appear normal.  2. Left anterior descending:  The LAD has two stents in the middle      segment proximal and distal to the takeoff of the anterolateral      branch.  The LAD now has a very good appearance without significant      restenosis within the stents.  The remainder of the middle segment      and distal LAD appear normal.  3. Large anterolateral branch:  This  branch has a stent in its      proximal segment and there is a critical restenosis within the      stent with a restenosis of 95%.  There is antegrade flow.  4. Circumflex coronary artery:  The circumflex is a small nondominant      vessel.  There is an ostial 50% stenosis.  There is a normal      antegrade flow and this is a small nondominant vessel.  5. Right coronary artery:  The right coronary artery is a large      dominant vessel supplying the posterolateral and posterior      descending branches.  The right coronary artery is normal in      appearance with normal flow.   Left ventricular cineangiogram:  Left ventricular chamber size,  contractility and wall thickness appear normal.  The ejection fraction  was estimated at 70%.  The mitral valve appeared normal.   FINAL DIAGNOSES:  1. Single-vessel coronary artery disease with good long-term      appearance of the mid left anterior descending artery lesion and      critical  restenosis within the large diagonal stented area.  2. Moderate stenosis ostial circumflex, small nondominant vessel.  3. Normal right coronary artery, large dominant vessel.  4. Normal left ventricular function.   DISPOSITION:  With the restenosis within the large diagonal stented  stent, Dr. Jacinto Halim will proceed with angioplasty of this lesion now.      Antionette Char, MD  Electronically Signed     JRT/MEDQ  D:  01/30/2007  T:  01/30/2007  Job:  [id number]   cc:   Cath Lab at [facility] R. Jacinto Halim, MD

## 2010-07-05 NOTE — Cardiovascular Report (Signed)
[name], [name]                ACCOUNT NO.:  [id number]   MEDICAL RECORD NO.:  [id number]          PATIENT TYPE:  INP   LOCATION:  6533                         FACILITY:  [facility]   PHYSICIAN:  Cristy Hilts. Jacinto Halim, MD       DATE OF BIRTH:  [birth year]   DATE OF PROCEDURE:  01/30/2007  DATE OF DISCHARGE:                            CARDIAC CATHETERIZATION   REFERRING PHYSICIAN:  Antionette Char, MD   PROCEDURE PERFORMED:  1. PTCA and stenting of the diagonal two vessel of the left anterior      descending artery.  2. Balloon angioplasty of the mid-LAD.   INDICATIONS:  Ms. Ersel Wadleigh is a 65 year old female with a history of  known coronary artery disease.  She had undergone PTCA and stenting of  the mid-LAD with implantation of a 3.0 x 18 mm Cypher and a diagonal  branch stent implantation with a 2.5 x 8 mm mini driver which is a non  drug-eluting stent by Dr. Lenise Herald.  She had developed restenosis  and had undergone balloon angioplasty of the diagonal two branch of the  LAD on January 19, 2006.  She has been complaining of increasing chest  discomfort with recurrent angina pectoris class II to III.  She  underwent stress Myoview which revealed anterolateral ischemia.  Given  this she underwent diagnostic cardiac catheterization by Dr. Charolette Child and she was found to have a high-grade in-stent restenosis of  the second, restenosis of the diagonal two branch of the LAD.  I was  called in for angioplasty given a large vessel.   EVALUATION DATA:  Successful PTCA and stent a sandwich, sandwich  stenting of the diagonal two branch of the LAD with implantation of a  2.75 x 12 mm PROMUS stent deployed at 16 atmospheres pressure.  Kissing  balloon technique was utilized with inflation of a 2.5 x 10 mm Dura Star  into the LAD for placement of the diagonal stent at the ostium to cover  the ostium.  Post stent deployment excellent angiographic results were  noted.  The mid LAD within  the stent was again dilated with the same  2.75 x 12 mm stent balloon to obtain complete flush of the diagonal  branch of the LAD stent.  Angiographically there was no stent overhang  into the LAD.  Excellent results were noted with reduction of stenosis  in the diagonal two branch of the LAD from 99% to 0% with brisk TIMI III  to TIMI III flow maintained throughout the procedure.  The LAD stent  balloon angioplasty was performed for kissing balloon technique only and  the LAD stent itself did not have any residual stenosis prior to the  procedure.   RECOMMENDATIONS:  The patient continued on aggressive risk modification.  She does have a 50% stenosis in the mid-to-distal segment of the  diagonal two and also a 40% stenosis into the mid-to-distal segment of  the LAD.  She will need Plavix at least for a period of 1-2 years or  more and continue aspirin.   TECHNIQUE OF THE  PROCEDURE:  Under usual sterile precautions, exchanging  the 6-French right femoral arterial access sheath to a 7-French sheath a  3.5  Q- guide was utilized to engage the left main coronary artery.  Using heparin and Integrilin for anticoagulation, a 190 cm x 0.014 Asahi  Prowater was advanced into the diagonal branch and multiple balloon  angioplasties were performed with a 2.5 x 10 mm DURA STAR balloon at 2,  4 and 20 atmospheres pressure from 18-120 seconds.  Having performed  this, there is still haziness noted.  Because of recurrent restenosis I  decided to proceed with stenting with a PROMUS 2.75 x 12 mm stent.  This  stent was deployed initially at 8 atmospheres pressure for 66 seconds  and then at 16 atmospheres pressure for 40 seconds.  The same stent  balloon was withdrawn back into the LAD and also a 2.5 x 10 mm DURA STAR  was placed in the mid-LAD stent and kissing balloon angioplasty was  performed at 14 atmospheres pressure into the diagonal branch and at 16  atmospheres pressure into the LAD branch and  angiography was repeated.  Excellent results were noted.  The 2.75 x 12-mm PROMUS stent balloon was  reintroduced back into the LAD over the LAD wire and multiple balloon  angioplasties at 6 atmospheres pressures for 30-40 seconds were  performed to obtain flaring of the diagonal stent to make sure that it  is in flush with the LAD while it was performed and angiography  repeated.  Excellent results were noted.   Right femoral arteriography was performed through the arterial access  sheath and the access closed with starclose with excellent hemostasis.  The patient tolerated the procedure with no immediate complications.      Cristy Hilts. Jacinto Halim, MD  Electronically Signed     JRG/MEDQ  D:  01/30/2007  T:  01/31/2007  Job:  [id number]   cc:   Antionette Char, MD

## 2010-07-06 ENCOUNTER — Encounter (HOSPITAL_COMMUNITY): Payer: Self-pay

## 2010-07-08 ENCOUNTER — Encounter (HOSPITAL_COMMUNITY): Payer: Self-pay

## 2010-07-08 NOTE — H&P (Signed)
[name], [name]                ACCOUNT NO.:  [id number]   MEDICAL RECORD NO.:  [id number]          PATIENT TYPE:  EMS   LOCATION:  MAJO                         FACILITY:  [facility]   PHYSICIAN:  Ulyses Amor, MD DATE OF BIRTH:  1945-11-21   DATE OF ADMISSION:  01/15/2006  DATE OF DISCHARGE:                              HISTORY & PHYSICAL   [name] is a 65 year old black woman who is admitted to [facility] for further evaluation of chest pain.   The patient has a history of coronary artery disease which dates back to  August of this year.  Cardiac catheterization demonstrated mid LAD and  second diagonal disease.  In September, she underwent elective PCI.  Stents were placed to the mid LAD and the second diagonal.  Her course  was uncomplicated.   The patient presented to the emergency department with a 5-day history  of intermittent episodes of chest pain.  The chest pain is described as  a sharp discomfort in the left anterior chest.  It radiates down her  left arm.  It is associated with dyspnea, but no diaphoresis or nausea.  Episodes seem to be precipitated by activity such as walking stairs.  On  occasion, they occur at random.  Episodes last several minutes and are  generally ameliorated by sublingual nitroglycerin.  She is not  experiencing any chest pain at this time.  She has experienced perhaps 5-  8 episodes of chest pain during this 5-day period.  The patient is  unsure as to whether or not this chest pain is similar to that which  preceded her percutaneous coronary intervention 2 months ago.   The patient has a history of subendocardial infarction in association  with her percutaneous coronary intervention.  There is no history of  congestive heart failure or arrhythmia.   This patient has a history of hypertension.  There is also a family  history of coronary artery disease (father).  The patient also has  dyslipidemia.  There is no history of  diabetes mellitus or smoking.   PAST MEDICAL HISTORY:  Otherwise notable only for fibromuscular  dysplasia.   MEDICATIONS:  Plavix, metoprolol, Simvastatin.   ALLERGIES:  No known drug allergies.   PAST SURGICAL HISTORY:  None.   SOCIAL HISTORY:  The patient lives with her husband.  She is employed as  an Programmer, systems.  She neither smokes cigarettes nor drinks alcohol.   FAMILY HISTORY:  Her mother died of colon cancer.  Her father died of a  myocardial infarction.   REVIEW OF SYSTEMS:  No new problems related to her head, eyes, ears,  nose, mouth, throat, lungs, gastrointestinal system, genitourinary  system, or extremities.  There is no history of neurologic or  psychiatric disorder.  There is no history of fever, chills, or weight  loss.   PHYSICAL EXAMINATION:  VITAL SIGNS:  Blood pressure 130/69, pulse 51 and  regular, respirations 18, temperature 98.7.  GENERAL:  The patient was a middle-age black woman in no discomfort.  She was alert, oriented, appropriate, and responsive.  HEENT:  Head, eyes, nose, and mouth were normal.  NECK:  Without thyromegaly or adenopathy.  Carotid pulses were palpable  bilaterally and without bruits.  HEART:  Normal S1 and S2.  There was no S3, S4, rub, or click.  There  was a soft systolic ejection murmur heard at the left sternal border.  No chest wall tenderness was noted.  LUNGS:  Clear.  ABDOMEN:  Soft and nontender.  There is no mass, hepatosplenomegaly,  bruit, distention, rebound, guarding, or rigidity.  Bowel sounds were  normal.  PELVIC:  BREASTS:  RECTAL:  Not performed as they were not pertinent to  the reason for acute care hospitalization.  EXTREMITIES:  Without edema, deviation, or deformity.  Radial and  dorsalis pedal pulses were palpable bilaterally.  NEUROLOGY:  Brief screening neurologic survey was unremarkable.   The electrocardiogram revealed sinus bradycardia with a nonspecific T  wave abnormality.   The chest  radiograph, according to the radiologist, demonstrated no  acute cardiopulmonary abnormality.  The initial set of cardiac markers  revealed a myoglobin of 84.7, CK-MB 1.4, and troponin 0.06.  Potassium  is 3.6, BUN 17, and creatinine 1.0.  The remaining studies were pending  at the time of this dictation.   IMPRESSION:  1. Chest pain; rule out unstable angina, troponin 0.06.  The chest      pain is generally exertional and is ameliorated by nitroglycerin.  2. Coronary artery disease.  Status post left anterior descending and      second diagonal stents in September of 2007.  3. Hypertension.  4. Fibromuscular dysplasia.   PLAN:  1. Telemetry.  2. Serial cardiac enzymes.  3. Aspirin.  4. Intravenous nitroglycerin.  5. Intravenous heparin.  6. Plavix.  7. Further measures per Darlin Priestly, M.D.      Ulyses Amor, MD  Electronically Signed     MSC/MEDQ  D:  01/16/2006  T:  01/16/2006  Job:  [id number]   cc:   Darlin Priestly, MD

## 2010-07-08 NOTE — Discharge Summary (Signed)
NAMEEVADNE, OSE                ACCOUNT NO.:  [id number]   MEDICAL RECORD NO.:  [id number]          PATIENT TYPE:  OIB   LOCATION:  6524                         FACILITY:  [facility]   PHYSICIAN:  Lezlie Octave, N.P.     DATE OF BIRTH:  10/29/1945   DATE OF ADMISSION:  01/19/2006  DATE OF DISCHARGE:  01/20/2006                               DISCHARGE SUMMARY   SUBJECTIVE:  Ms. [name] is 65 year old female patient of Dr. Charolette Child with known coronary artery disease.  She recently had an  admission for chest pain.  She came back for outpatient cardiac  catheterization.  This was performed by Dr. Aleen Campi.  She was found to  have early restenosis of a diagonal 2 within a __________  stent placed  by Dr. Jenne Campus on October 26, 2005.  Her LAD stent was a Cypher stent  and that was patent.  She had a normal LIMA and normal LV function.  Dr.  Aleen Campi discussed the case with Dr. Elsie Lincoln and she went on to have a  balloon angioplasty to sites in her diagonal too.  She was seen by Dr.  Elsie Lincoln on January 20, 2006, considered stable for discharge.  Her blood  pressure is 134/54.  Her pulse was 67.  Respirations were 18.  Temp was  98.9.  Hemoglobin 12.3, hematocrit 35.4, WBC is 4.3, platelets 156,  sodium 138, potassium 3.5, BUN 12, creatinine 1.9, and her glucose was  110.  Her CK-MB was negative x3.  Her troponin was 0.01 and 0.01.  She  will follow up with Dr. Aleen Campi as an outpatient.   DISCHARGE MEDICATIONS:  1. Plavix 35 mg a day.  She is not to stop this.  It is recommended      that she be on it for at least 1 more year.  2. Aspirin 325 mg 1 tablet per day.  3. Toprol XL 50 mg 1 tablet per day.  4. Simvastatin 20 mg at bedtime.  5. Aspirin 325 mg 1 tablet a day.  6. Nitroglycerin 1/150 sublingual p.r.n.  7. Ambien 10 mg if needed for sleep.  8. Prilosec 20 mg 1 tablet per day.   DISCHARGE DIAGNOSES:  1. Angina.  2. 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  This admission she underwent cardiac      balloon angio of her diagonal 2.  3. Hyperlipidemia.  4. Hypertension.      Lezlie Octave, N.P.     BB/MEDQ  D:  01/20/2006  T:  01/21/2006  Job:  [id number]   cc:   Antionette Char, MD

## 2010-07-11 ENCOUNTER — Encounter (HOSPITAL_COMMUNITY): Payer: Self-pay

## 2010-07-13 ENCOUNTER — Encounter (HOSPITAL_COMMUNITY): Payer: Self-pay

## 2010-07-15 ENCOUNTER — Encounter (HOSPITAL_COMMUNITY): Payer: Self-pay

## 2010-07-18 ENCOUNTER — Encounter (HOSPITAL_COMMUNITY): Payer: Self-pay

## 2010-07-20 ENCOUNTER — Encounter (HOSPITAL_COMMUNITY): Payer: Self-pay

## 2010-07-22 ENCOUNTER — Encounter (HOSPITAL_COMMUNITY): Payer: Self-pay | Attending: Internal Medicine

## 2010-07-22 DIAGNOSIS — I2510 Atherosclerotic heart disease of native coronary artery without angina pectoris: Secondary | ICD-10-CM | POA: Insufficient documentation

## 2010-07-22 DIAGNOSIS — I209 Angina pectoris, unspecified: Secondary | ICD-10-CM | POA: Insufficient documentation

## 2010-07-22 DIAGNOSIS — I10 Essential (primary) hypertension: Secondary | ICD-10-CM | POA: Insufficient documentation

## 2010-07-22 DIAGNOSIS — E785 Hyperlipidemia, unspecified: Secondary | ICD-10-CM | POA: Insufficient documentation

## 2010-07-22 DIAGNOSIS — Z9861 Coronary angioplasty status: Secondary | ICD-10-CM | POA: Insufficient documentation

## 2010-07-22 DIAGNOSIS — Z5189 Encounter for other specified aftercare: Secondary | ICD-10-CM | POA: Insufficient documentation

## 2010-07-25 ENCOUNTER — Encounter (HOSPITAL_COMMUNITY): Payer: Self-pay

## 2010-07-27 ENCOUNTER — Encounter (HOSPITAL_COMMUNITY): Payer: Self-pay

## 2010-07-29 ENCOUNTER — Encounter (HOSPITAL_COMMUNITY): Payer: Self-pay

## 2010-08-01 ENCOUNTER — Encounter (HOSPITAL_COMMUNITY): Payer: Self-pay

## 2010-08-03 ENCOUNTER — Encounter (HOSPITAL_COMMUNITY): Payer: Self-pay

## 2010-08-05 ENCOUNTER — Encounter (HOSPITAL_COMMUNITY): Payer: Self-pay

## 2010-08-08 ENCOUNTER — Encounter (HOSPITAL_COMMUNITY): Payer: Self-pay

## 2010-08-10 ENCOUNTER — Encounter (HOSPITAL_COMMUNITY): Payer: Self-pay

## 2010-08-12 ENCOUNTER — Encounter (HOSPITAL_COMMUNITY): Payer: Self-pay

## 2010-08-15 ENCOUNTER — Encounter (HOSPITAL_COMMUNITY): Payer: Self-pay

## 2010-08-17 ENCOUNTER — Encounter (HOSPITAL_COMMUNITY): Payer: Self-pay

## 2010-08-19 ENCOUNTER — Encounter (HOSPITAL_COMMUNITY): Payer: Self-pay

## 2010-08-22 ENCOUNTER — Encounter (HOSPITAL_COMMUNITY): Payer: Self-pay | Attending: Internal Medicine

## 2010-08-22 DIAGNOSIS — Z5189 Encounter for other specified aftercare: Secondary | ICD-10-CM | POA: Insufficient documentation

## 2010-08-22 DIAGNOSIS — I10 Essential (primary) hypertension: Secondary | ICD-10-CM | POA: Insufficient documentation

## 2010-08-22 DIAGNOSIS — I2510 Atherosclerotic heart disease of native coronary artery without angina pectoris: Secondary | ICD-10-CM | POA: Insufficient documentation

## 2010-08-22 DIAGNOSIS — E785 Hyperlipidemia, unspecified: Secondary | ICD-10-CM | POA: Insufficient documentation

## 2010-08-22 DIAGNOSIS — I209 Angina pectoris, unspecified: Secondary | ICD-10-CM | POA: Insufficient documentation

## 2010-08-22 DIAGNOSIS — Z9861 Coronary angioplasty status: Secondary | ICD-10-CM | POA: Insufficient documentation

## 2010-08-24 ENCOUNTER — Encounter (HOSPITAL_COMMUNITY): Payer: Self-pay

## 2010-08-26 ENCOUNTER — Telehealth: Payer: Self-pay | Admitting: Internal Medicine

## 2010-08-26 ENCOUNTER — Encounter (HOSPITAL_COMMUNITY): Payer: Self-pay

## 2010-08-26 NOTE — Telephone Encounter (Signed)
Pt wants to know can she stop taken asa, plavix. For dental work.

## 2010-08-26 NOTE — Telephone Encounter (Signed)
Left message to call back  

## 2010-08-29 ENCOUNTER — Encounter (HOSPITAL_COMMUNITY): Payer: Self-pay

## 2010-08-30 NOTE — Telephone Encounter (Signed)
Left mess pt did not need to hold asa and plavix for dental work, call back for questions

## 2010-08-31 ENCOUNTER — Encounter (HOSPITAL_COMMUNITY): Payer: Self-pay

## 2010-09-01 ENCOUNTER — Encounter: Payer: Self-pay | Admitting: Internal Medicine

## 2010-09-02 ENCOUNTER — Encounter (HOSPITAL_COMMUNITY): Payer: Self-pay

## 2010-09-02 ENCOUNTER — Institutional Professional Consult (permissible substitution): Payer: Self-pay | Admitting: Internal Medicine

## 2010-09-05 ENCOUNTER — Encounter (HOSPITAL_COMMUNITY): Payer: Self-pay

## 2010-09-07 ENCOUNTER — Encounter (HOSPITAL_COMMUNITY): Payer: Self-pay

## 2010-09-09 ENCOUNTER — Encounter (HOSPITAL_COMMUNITY): Payer: Self-pay

## 2010-09-12 ENCOUNTER — Encounter (HOSPITAL_COMMUNITY): Payer: Self-pay

## 2010-09-14 ENCOUNTER — Encounter (HOSPITAL_COMMUNITY): Payer: Self-pay

## 2010-09-16 ENCOUNTER — Encounter (HOSPITAL_COMMUNITY): Payer: Self-pay

## 2010-09-19 ENCOUNTER — Encounter (HOSPITAL_COMMUNITY): Payer: Self-pay

## 2010-09-21 ENCOUNTER — Encounter (HOSPITAL_COMMUNITY): Payer: Self-pay | Attending: Internal Medicine

## 2010-09-21 DIAGNOSIS — Z9861 Coronary angioplasty status: Secondary | ICD-10-CM | POA: Insufficient documentation

## 2010-09-21 DIAGNOSIS — I209 Angina pectoris, unspecified: Secondary | ICD-10-CM | POA: Insufficient documentation

## 2010-09-21 DIAGNOSIS — Z5189 Encounter for other specified aftercare: Secondary | ICD-10-CM | POA: Insufficient documentation

## 2010-09-21 DIAGNOSIS — I2510 Atherosclerotic heart disease of native coronary artery without angina pectoris: Secondary | ICD-10-CM | POA: Insufficient documentation

## 2010-09-21 DIAGNOSIS — E785 Hyperlipidemia, unspecified: Secondary | ICD-10-CM | POA: Insufficient documentation

## 2010-09-21 DIAGNOSIS — I10 Essential (primary) hypertension: Secondary | ICD-10-CM | POA: Insufficient documentation

## 2010-09-23 ENCOUNTER — Encounter (HOSPITAL_COMMUNITY): Payer: Self-pay

## 2010-09-26 ENCOUNTER — Encounter (HOSPITAL_COMMUNITY): Payer: Self-pay

## 2010-09-28 ENCOUNTER — Encounter (HOSPITAL_COMMUNITY): Payer: Self-pay

## 2010-09-30 ENCOUNTER — Encounter (HOSPITAL_COMMUNITY): Payer: Self-pay

## 2010-10-03 ENCOUNTER — Encounter (HOSPITAL_COMMUNITY): Payer: Self-pay

## 2010-10-05 ENCOUNTER — Encounter (HOSPITAL_COMMUNITY): Payer: Self-pay

## 2010-10-07 ENCOUNTER — Encounter (HOSPITAL_COMMUNITY): Payer: Self-pay

## 2010-10-10 ENCOUNTER — Encounter (HOSPITAL_COMMUNITY): Payer: Self-pay

## 2010-10-12 ENCOUNTER — Encounter (HOSPITAL_COMMUNITY): Payer: Self-pay

## 2010-10-14 ENCOUNTER — Encounter (HOSPITAL_COMMUNITY): Payer: Self-pay

## 2010-10-17 ENCOUNTER — Encounter (HOSPITAL_COMMUNITY): Payer: Self-pay

## 2010-10-19 ENCOUNTER — Encounter (HOSPITAL_COMMUNITY): Payer: Self-pay

## 2010-10-21 ENCOUNTER — Encounter (HOSPITAL_COMMUNITY): Payer: Self-pay

## 2010-10-24 ENCOUNTER — Encounter (HOSPITAL_COMMUNITY): Payer: Self-pay | Attending: Internal Medicine

## 2010-10-24 DIAGNOSIS — Z5189 Encounter for other specified aftercare: Secondary | ICD-10-CM | POA: Insufficient documentation

## 2010-10-24 DIAGNOSIS — Z9861 Coronary angioplasty status: Secondary | ICD-10-CM | POA: Insufficient documentation

## 2010-10-24 DIAGNOSIS — E785 Hyperlipidemia, unspecified: Secondary | ICD-10-CM | POA: Insufficient documentation

## 2010-10-24 DIAGNOSIS — I2510 Atherosclerotic heart disease of native coronary artery without angina pectoris: Secondary | ICD-10-CM | POA: Insufficient documentation

## 2010-10-24 DIAGNOSIS — I209 Angina pectoris, unspecified: Secondary | ICD-10-CM | POA: Insufficient documentation

## 2010-10-24 DIAGNOSIS — I10 Essential (primary) hypertension: Secondary | ICD-10-CM | POA: Insufficient documentation

## 2010-10-26 ENCOUNTER — Encounter (HOSPITAL_COMMUNITY): Payer: Self-pay

## 2010-10-28 ENCOUNTER — Encounter (HOSPITAL_COMMUNITY): Payer: Self-pay

## 2010-10-31 ENCOUNTER — Encounter (HOSPITAL_COMMUNITY): Payer: Self-pay

## 2010-11-02 ENCOUNTER — Encounter (HOSPITAL_COMMUNITY): Payer: Self-pay

## 2010-11-04 ENCOUNTER — Encounter (HOSPITAL_COMMUNITY): Payer: Self-pay

## 2010-11-07 ENCOUNTER — Encounter (HOSPITAL_COMMUNITY): Payer: Self-pay

## 2010-11-09 ENCOUNTER — Encounter (HOSPITAL_COMMUNITY): Payer: Self-pay

## 2010-11-11 ENCOUNTER — Encounter (HOSPITAL_COMMUNITY): Payer: Self-pay

## 2010-11-14 ENCOUNTER — Encounter (HOSPITAL_COMMUNITY): Payer: Self-pay

## 2010-11-16 ENCOUNTER — Encounter (HOSPITAL_COMMUNITY): Payer: Self-pay

## 2010-11-18 ENCOUNTER — Encounter (HOSPITAL_COMMUNITY): Payer: Self-pay

## 2010-11-21 ENCOUNTER — Encounter (HOSPITAL_COMMUNITY): Payer: Self-pay | Attending: Internal Medicine

## 2010-11-21 DIAGNOSIS — Z5189 Encounter for other specified aftercare: Secondary | ICD-10-CM | POA: Insufficient documentation

## 2010-11-21 DIAGNOSIS — Z9861 Coronary angioplasty status: Secondary | ICD-10-CM | POA: Insufficient documentation

## 2010-11-21 DIAGNOSIS — I10 Essential (primary) hypertension: Secondary | ICD-10-CM | POA: Insufficient documentation

## 2010-11-21 DIAGNOSIS — E785 Hyperlipidemia, unspecified: Secondary | ICD-10-CM | POA: Insufficient documentation

## 2010-11-21 DIAGNOSIS — I209 Angina pectoris, unspecified: Secondary | ICD-10-CM | POA: Insufficient documentation

## 2010-11-21 DIAGNOSIS — I2510 Atherosclerotic heart disease of native coronary artery without angina pectoris: Secondary | ICD-10-CM | POA: Insufficient documentation

## 2010-11-23 ENCOUNTER — Encounter (HOSPITAL_COMMUNITY): Payer: Self-pay

## 2010-11-25 ENCOUNTER — Encounter (HOSPITAL_COMMUNITY): Payer: Self-pay

## 2010-11-28 ENCOUNTER — Encounter (HOSPITAL_COMMUNITY): Payer: Self-pay

## 2010-11-28 LAB — CBC
HCT: 34.9 — ABNORMAL LOW
Hemoglobin: 11.8 — ABNORMAL LOW
MCV: 88.3
RBC: 3.95
WBC: 3.6 — ABNORMAL LOW

## 2010-11-28 LAB — BASIC METABOLIC PANEL
BUN: 10
CO2: 26
Calcium: 8.4
Chloride: 108
Creatinine, Ser: 1.02
Glucose, Bld: 116 — ABNORMAL HIGH

## 2010-11-28 LAB — CARDIAC PANEL(CRET KIN+CKTOT+MB+TROPI)
Relative Index: 0.8
Total CK: 132
Troponin I: 0.14 — ABNORMAL HIGH
Troponin I: 0.17 — ABNORMAL HIGH

## 2010-11-28 LAB — HEMOGLOBIN AND HEMATOCRIT, BLOOD: HCT: 33.9 — ABNORMAL LOW

## 2010-11-30 ENCOUNTER — Encounter (HOSPITAL_COMMUNITY): Payer: Self-pay

## 2010-12-02 ENCOUNTER — Encounter (HOSPITAL_COMMUNITY): Payer: Self-pay

## 2010-12-05 ENCOUNTER — Encounter (HOSPITAL_COMMUNITY): Payer: Self-pay

## 2010-12-07 ENCOUNTER — Encounter (HOSPITAL_COMMUNITY): Payer: Self-pay

## 2010-12-09 ENCOUNTER — Encounter (HOSPITAL_COMMUNITY): Payer: Self-pay

## 2010-12-12 ENCOUNTER — Encounter (HOSPITAL_COMMUNITY): Payer: Self-pay

## 2010-12-14 ENCOUNTER — Encounter (HOSPITAL_COMMUNITY): Payer: Self-pay

## 2010-12-16 ENCOUNTER — Encounter (HOSPITAL_COMMUNITY): Payer: Self-pay

## 2010-12-19 ENCOUNTER — Encounter (HOSPITAL_COMMUNITY): Payer: Self-pay

## 2010-12-21 ENCOUNTER — Encounter (HOSPITAL_COMMUNITY): Payer: Self-pay

## 2010-12-23 ENCOUNTER — Encounter (HOSPITAL_COMMUNITY): Payer: Self-pay

## 2010-12-26 ENCOUNTER — Encounter (HOSPITAL_COMMUNITY): Payer: Self-pay

## 2010-12-26 DIAGNOSIS — Z5189 Encounter for other specified aftercare: Secondary | ICD-10-CM | POA: Insufficient documentation

## 2010-12-26 DIAGNOSIS — I209 Angina pectoris, unspecified: Secondary | ICD-10-CM | POA: Insufficient documentation

## 2010-12-26 DIAGNOSIS — E785 Hyperlipidemia, unspecified: Secondary | ICD-10-CM | POA: Insufficient documentation

## 2010-12-26 DIAGNOSIS — I2510 Atherosclerotic heart disease of native coronary artery without angina pectoris: Secondary | ICD-10-CM | POA: Insufficient documentation

## 2010-12-26 DIAGNOSIS — I10 Essential (primary) hypertension: Secondary | ICD-10-CM | POA: Insufficient documentation

## 2010-12-26 DIAGNOSIS — Z9861 Coronary angioplasty status: Secondary | ICD-10-CM | POA: Insufficient documentation

## 2010-12-28 ENCOUNTER — Encounter (HOSPITAL_COMMUNITY): Payer: Self-pay

## 2010-12-30 ENCOUNTER — Encounter (HOSPITAL_COMMUNITY)
Admission: RE | Admit: 2010-12-30 | Discharge: 2010-12-30 | Payer: Self-pay | Source: Ambulatory Visit | Attending: Internal Medicine | Admitting: Internal Medicine

## 2011-01-02 ENCOUNTER — Encounter (HOSPITAL_COMMUNITY): Payer: Self-pay

## 2011-01-04 ENCOUNTER — Encounter (HOSPITAL_COMMUNITY): Payer: Self-pay

## 2011-01-06 ENCOUNTER — Encounter (HOSPITAL_COMMUNITY): Payer: Self-pay

## 2011-01-09 ENCOUNTER — Encounter (HOSPITAL_COMMUNITY): Payer: Self-pay

## 2011-01-11 ENCOUNTER — Encounter (HOSPITAL_COMMUNITY): Payer: Self-pay

## 2011-01-13 ENCOUNTER — Encounter (HOSPITAL_COMMUNITY): Payer: Self-pay

## 2011-01-16 ENCOUNTER — Encounter (HOSPITAL_COMMUNITY)
Admission: RE | Admit: 2011-01-16 | Discharge: 2011-01-16 | Disposition: A | Discharge status: Home / Self Care | Payer: Self-pay | Source: Ambulatory Visit | Attending: Internal Medicine | Admitting: Internal Medicine

## 2011-01-18 ENCOUNTER — Encounter (HOSPITAL_COMMUNITY): Payer: Self-pay

## 2011-01-20 ENCOUNTER — Encounter (HOSPITAL_COMMUNITY)
Admission: RE | Admit: 2011-01-20 | Discharge: 2011-01-20 | Disposition: A | Discharge status: Home / Self Care | Payer: Self-pay | Source: Ambulatory Visit | Attending: Internal Medicine | Admitting: Internal Medicine

## 2011-01-23 ENCOUNTER — Encounter (HOSPITAL_COMMUNITY): Payer: Medicare Other | Attending: Internal Medicine

## 2011-01-23 DIAGNOSIS — Z9861 Coronary angioplasty status: Secondary | ICD-10-CM | POA: Insufficient documentation

## 2011-01-23 DIAGNOSIS — I209 Angina pectoris, unspecified: Secondary | ICD-10-CM | POA: Insufficient documentation

## 2011-01-23 DIAGNOSIS — E785 Hyperlipidemia, unspecified: Secondary | ICD-10-CM | POA: Insufficient documentation

## 2011-01-23 DIAGNOSIS — Z5189 Encounter for other specified aftercare: Secondary | ICD-10-CM | POA: Insufficient documentation

## 2011-01-23 DIAGNOSIS — I10 Essential (primary) hypertension: Secondary | ICD-10-CM | POA: Insufficient documentation

## 2011-01-23 DIAGNOSIS — I2510 Atherosclerotic heart disease of native coronary artery without angina pectoris: Secondary | ICD-10-CM | POA: Insufficient documentation

## 2011-01-25 ENCOUNTER — Encounter (HOSPITAL_COMMUNITY): Payer: Medicare Other

## 2011-01-27 ENCOUNTER — Encounter (HOSPITAL_COMMUNITY): Payer: Medicare Other

## 2011-01-30 ENCOUNTER — Encounter (HOSPITAL_COMMUNITY): Payer: Medicare Other

## 2011-02-01 ENCOUNTER — Encounter (HOSPITAL_COMMUNITY): Payer: Medicare Other

## 2011-02-03 ENCOUNTER — Encounter (HOSPITAL_COMMUNITY): Payer: Medicare Other

## 2011-02-06 ENCOUNTER — Encounter (HOSPITAL_COMMUNITY): Payer: Medicare Other

## 2011-02-08 ENCOUNTER — Encounter (HOSPITAL_COMMUNITY): Payer: Medicare Other

## 2011-02-10 ENCOUNTER — Encounter (HOSPITAL_COMMUNITY): Payer: Medicare Other

## 2011-02-13 ENCOUNTER — Encounter: Payer: Self-pay | Admitting: Internal Medicine

## 2011-02-13 ENCOUNTER — Encounter (HOSPITAL_COMMUNITY): Payer: Medicare Other

## 2011-02-13 ENCOUNTER — Ambulatory Visit (INDEPENDENT_AMBULATORY_CARE_PROVIDER_SITE_OTHER): Payer: BC Managed Care – PPO | Admitting: Internal Medicine

## 2011-02-13 VITALS — BP 108/70 | HR 60 | Ht 68.0 in | Wt 220.0 lb

## 2011-02-13 DIAGNOSIS — I2510 Atherosclerotic heart disease of native coronary artery without angina pectoris: Secondary | ICD-10-CM

## 2011-02-13 DIAGNOSIS — R011 Cardiac murmur, unspecified: Secondary | ICD-10-CM | POA: Insufficient documentation

## 2011-02-13 NOTE — Assessment & Plan Note (Signed)
Sounds like mild AS or aortosclerosis. Will check echo in 6 months.

## 2011-02-13 NOTE — Progress Notes (Signed)
   PCP: Juline Patch  HPI:  Ms. Linquist is a delightful 65 year old woman with a history of hypertension, hyperlipidemia, fibromuscular dysplasia of the intracranial vessels, and previous left internal carotid artery dissection. She also has a history of coronary artery disease. She is status post previous stenting to the LAD and diagonal. Most recently, she had a Promus drug-eluting stent to the second diagonal in December 2008. She has followed Dr. Aleen Campi, but since his retirement has not reestablished with another cardiologist.  Was in cardiac rehab in December 2011 and experienced CP. Took NTG and then had a presyncopal episode with SBP 80. With this developed transiet L facial dropp thought to be due to cerebral hypoperfusion. CT/MRI normal. R/o for MI with serial cardiac markers. Lexiscan Myoview 12/11 showed EF 72% with normal perfusion. Returns for f/u.   Doing very well. Continues in cardiac rehab program in maintenance program. Denies CP. Occasionally SOB when going up steps. BP well controlled. No HF symptoms. No problems with meds. Cholesterol not checked this year. Unable to tolerate b-blockers due to bradycardia.   ROS: All systems negative except as listed in HPI, PMH and Problem List.  Past Medical History  Diagnosis Date  . Internal hemorrhoids without mention of complication   . Neutropenia, unspecified   . IBS (irritable bowel syndrome)   . Coronary artery disease   . Hyperlipidemia   . Chest pain   . Hypotension   . Stroke      History of left internal carotid artery dissection,  History of fibromuscular dysplasia  . Hypertension     Current Outpatient Prescriptions  Medication Sig Dispense Refill  . aspirin 81 MG tablet Take 81 mg by mouth daily.        . clopidogrel (PLAVIX) 75 MG tablet Take 75 mg by mouth daily.        . nitroGLYCERIN (NITROSTAT) 0.4 MG SL tablet Place 0.4 mg under the tongue every 5 (five) minutes as needed.        . pravastatin (PRAVACHOL)  20 MG tablet Take 20 mg by mouth daily.        . Valsartan (DIOVAN PO) Take by mouth.          PHYSICAL EXAM: Filed Vitals:   02/13/11 0950  BP: 108/70  Pulse: 60   General:  Well appearing. No resp difficulty HEENT: normal Neck: supple. JVP flat. Carotids 2+ bilaterally; faint radiated bruits. No lymphadenopathy or thryomegaly appreciated. Cor: PMI normal. Regular rate & rhythm. No rubs, gallops 2/6 AS murmur. Lungs: clear Abdomen: soft, nontender, nondistended. No hepatosplenomegaly. No bruits or masses. Good bowel sounds. Extremities: no cyanosis, clubbing, rash, edema Neuro: alert & orientedx3, cranial nerves grossly intact. Moves all 4 extremities w/o difficulty. Affect pleasant.    ECG:  Sinus brady 54 poor R wave progression. No ST-T wave abnormalities.     ASSESSMENT & PLAN

## 2011-02-13 NOTE — Patient Instructions (Signed)
Your physician recommends that you return for lab work in the next 2-3 weeks.  Please come fasting for these lab tests.  Your physician recommends that you schedule a follow-up appointment in the [facility] with Dr. Gala Romney in 6 months.  Your physician has requested that you have an echocardiogram. Echocardiography is a painless test that uses sound waves to create images of your heart. It provides your doctor with information about the size and shape of your heart and how well your heart's chambers and valves are working. This procedure takes approximately one hour. There are no restrictions for this procedure.

## 2011-02-13 NOTE — Assessment & Plan Note (Signed)
No evidence of ischemia. Continue current regimen. Continue CR maintenance program.

## 2011-02-13 NOTE — Assessment & Plan Note (Signed)
Goal LDL < 70. On low-dose prava. Will check lipids and CMET today.

## 2011-02-15 ENCOUNTER — Encounter (HOSPITAL_COMMUNITY): Payer: Medicare Other

## 2011-02-17 ENCOUNTER — Encounter (HOSPITAL_COMMUNITY): Payer: Medicare Other

## 2011-02-20 ENCOUNTER — Encounter (HOSPITAL_COMMUNITY): Payer: Medicare Other

## 2011-02-22 ENCOUNTER — Encounter (HOSPITAL_COMMUNITY): Payer: Self-pay

## 2011-02-24 ENCOUNTER — Encounter (HOSPITAL_COMMUNITY): Payer: Self-pay

## 2011-02-24 DIAGNOSIS — E785 Hyperlipidemia, unspecified: Secondary | ICD-10-CM | POA: Insufficient documentation

## 2011-02-24 DIAGNOSIS — I209 Angina pectoris, unspecified: Secondary | ICD-10-CM | POA: Insufficient documentation

## 2011-02-24 DIAGNOSIS — I2510 Atherosclerotic heart disease of native coronary artery without angina pectoris: Secondary | ICD-10-CM | POA: Insufficient documentation

## 2011-02-24 DIAGNOSIS — Z9861 Coronary angioplasty status: Secondary | ICD-10-CM | POA: Insufficient documentation

## 2011-02-24 DIAGNOSIS — Z5189 Encounter for other specified aftercare: Secondary | ICD-10-CM | POA: Insufficient documentation

## 2011-02-24 DIAGNOSIS — I10 Essential (primary) hypertension: Secondary | ICD-10-CM | POA: Insufficient documentation

## 2011-02-27 ENCOUNTER — Encounter (HOSPITAL_COMMUNITY): Payer: Self-pay

## 2011-03-01 ENCOUNTER — Encounter (HOSPITAL_COMMUNITY): Payer: Self-pay

## 2011-03-03 ENCOUNTER — Encounter (HOSPITAL_COMMUNITY): Payer: Self-pay

## 2011-03-06 ENCOUNTER — Encounter (HOSPITAL_COMMUNITY): Payer: Self-pay

## 2011-03-07 ENCOUNTER — Other Ambulatory Visit (HOSPITAL_COMMUNITY): Payer: BC Managed Care – PPO | Admitting: Radiology

## 2011-03-07 ENCOUNTER — Other Ambulatory Visit: Payer: BC Managed Care – PPO | Admitting: *Deleted

## 2011-03-08 ENCOUNTER — Encounter (HOSPITAL_COMMUNITY): Payer: Self-pay

## 2011-03-09 ENCOUNTER — Ambulatory Visit (HOSPITAL_COMMUNITY): Payer: BC Managed Care – PPO | Attending: Internal Medicine | Admitting: Radiology

## 2011-03-09 ENCOUNTER — Other Ambulatory Visit (INDEPENDENT_AMBULATORY_CARE_PROVIDER_SITE_OTHER): Payer: BC Managed Care – PPO | Admitting: *Deleted

## 2011-03-09 DIAGNOSIS — I10 Essential (primary) hypertension: Secondary | ICD-10-CM | POA: Insufficient documentation

## 2011-03-09 DIAGNOSIS — I2510 Atherosclerotic heart disease of native coronary artery without angina pectoris: Secondary | ICD-10-CM | POA: Insufficient documentation

## 2011-03-09 DIAGNOSIS — R0989 Other specified symptoms and signs involving the circulatory and respiratory systems: Secondary | ICD-10-CM | POA: Insufficient documentation

## 2011-03-09 DIAGNOSIS — R0609 Other forms of dyspnea: Secondary | ICD-10-CM | POA: Insufficient documentation

## 2011-03-09 DIAGNOSIS — E785 Hyperlipidemia, unspecified: Secondary | ICD-10-CM | POA: Insufficient documentation

## 2011-03-09 DIAGNOSIS — R011 Cardiac murmur, unspecified: Secondary | ICD-10-CM

## 2011-03-09 LAB — HEPATIC FUNCTION PANEL
AST: 22 U/L (ref 0–37)
Alkaline Phosphatase: 64 U/L (ref 39–117)
Bilirubin, Direct: 0.1 mg/dL (ref 0.0–0.3)
Total Bilirubin: 0.9 mg/dL (ref 0.3–1.2)

## 2011-03-09 LAB — BASIC METABOLIC PANEL
BUN: 18 mg/dL (ref 6–23)
CO2: 27 mEq/L (ref 19–32)
Calcium: 9.2 mg/dL (ref 8.4–10.5)
Glucose, Bld: 96 mg/dL (ref 70–99)
Sodium: 138 mEq/L (ref 135–145)

## 2011-03-09 LAB — LIPID PANEL
HDL: 57.2 mg/dL (ref >39.00)
Total CHOL/HDL Ratio: 3

## 2011-03-10 ENCOUNTER — Encounter (HOSPITAL_COMMUNITY): Payer: Self-pay

## 2011-03-13 ENCOUNTER — Encounter (HOSPITAL_COMMUNITY): Payer: Self-pay

## 2011-03-15 ENCOUNTER — Encounter (HOSPITAL_COMMUNITY): Payer: Self-pay

## 2011-03-17 ENCOUNTER — Encounter (HOSPITAL_COMMUNITY): Payer: Self-pay

## 2011-03-20 ENCOUNTER — Encounter (HOSPITAL_COMMUNITY): Payer: Self-pay

## 2011-03-20 ENCOUNTER — Encounter (HOSPITAL_COMMUNITY)
Admission: RE | Admit: 2011-03-20 | Discharge: 2011-03-20 | Disposition: A | Discharge status: Home / Self Care | Payer: Self-pay | Source: Ambulatory Visit | Attending: Internal Medicine | Admitting: Internal Medicine

## 2011-03-22 ENCOUNTER — Telehealth: Payer: Self-pay | Admitting: Internal Medicine

## 2011-03-22 ENCOUNTER — Encounter (HOSPITAL_COMMUNITY)
Admission: RE | Admit: 2011-03-22 | Discharge: 2011-03-22 | Disposition: A | Discharge status: Home / Self Care | Payer: Self-pay | Source: Ambulatory Visit | Attending: Internal Medicine | Admitting: Internal Medicine

## 2011-03-22 ENCOUNTER — Encounter (HOSPITAL_COMMUNITY): Payer: Self-pay

## 2011-03-22 NOTE — Telephone Encounter (Signed)
FU Call: Pt returning call from [place]. Please call back.   Alt # [id number]

## 2011-03-22 NOTE — Telephone Encounter (Signed)
Fu call °Patient returning your call °

## 2011-03-22 NOTE — Telephone Encounter (Signed)
Spoke with pt and have given lab and echo results. She will call back in March to schedule July app. Mylo Red RN

## 2011-03-24 ENCOUNTER — Encounter (HOSPITAL_COMMUNITY): Payer: Self-pay

## 2011-03-24 ENCOUNTER — Encounter (HOSPITAL_COMMUNITY)
Admission: RE | Admit: 2011-03-24 | Discharge: 2011-03-24 | Disposition: A | Discharge status: Home / Self Care | Payer: Self-pay | Source: Ambulatory Visit | Attending: Internal Medicine | Admitting: Internal Medicine

## 2011-03-24 DIAGNOSIS — Z9861 Coronary angioplasty status: Secondary | ICD-10-CM | POA: Insufficient documentation

## 2011-03-24 DIAGNOSIS — I2510 Atherosclerotic heart disease of native coronary artery without angina pectoris: Secondary | ICD-10-CM | POA: Insufficient documentation

## 2011-03-24 DIAGNOSIS — E785 Hyperlipidemia, unspecified: Secondary | ICD-10-CM | POA: Insufficient documentation

## 2011-03-24 DIAGNOSIS — Z5189 Encounter for other specified aftercare: Secondary | ICD-10-CM | POA: Insufficient documentation

## 2011-03-24 DIAGNOSIS — I10 Essential (primary) hypertension: Secondary | ICD-10-CM | POA: Insufficient documentation

## 2011-03-24 DIAGNOSIS — I209 Angina pectoris, unspecified: Secondary | ICD-10-CM | POA: Insufficient documentation

## 2011-03-27 ENCOUNTER — Encounter (HOSPITAL_COMMUNITY)
Admission: RE | Admit: 2011-03-27 | Discharge: 2011-03-27 | Disposition: A | Discharge status: Home / Self Care | Payer: Self-pay | Source: Ambulatory Visit | Attending: Internal Medicine | Admitting: Internal Medicine

## 2011-03-29 ENCOUNTER — Encounter (HOSPITAL_COMMUNITY)
Admission: RE | Admit: 2011-03-29 | Discharge: 2011-03-29 | Disposition: A | Discharge status: Home / Self Care | Payer: Self-pay | Source: Ambulatory Visit | Attending: Internal Medicine | Admitting: Internal Medicine

## 2011-03-31 ENCOUNTER — Encounter (HOSPITAL_COMMUNITY): Payer: Self-pay

## 2011-04-03 ENCOUNTER — Encounter (HOSPITAL_COMMUNITY)
Admission: RE | Admit: 2011-04-03 | Discharge: 2011-04-03 | Disposition: A | Discharge status: Home / Self Care | Payer: Self-pay | Source: Ambulatory Visit | Attending: Internal Medicine | Admitting: Internal Medicine

## 2011-04-05 ENCOUNTER — Encounter (HOSPITAL_COMMUNITY): Payer: Self-pay

## 2011-04-07 ENCOUNTER — Encounter (HOSPITAL_COMMUNITY)
Admission: RE | Admit: 2011-04-07 | Discharge: 2011-04-07 | Disposition: A | Discharge status: Home / Self Care | Payer: Self-pay | Source: Ambulatory Visit | Attending: Internal Medicine | Admitting: Internal Medicine

## 2011-04-10 ENCOUNTER — Encounter (HOSPITAL_COMMUNITY)
Admission: RE | Admit: 2011-04-10 | Discharge: 2011-04-10 | Disposition: A | Discharge status: Home / Self Care | Payer: Self-pay | Source: Ambulatory Visit | Attending: Internal Medicine | Admitting: Internal Medicine

## 2011-04-12 ENCOUNTER — Encounter (HOSPITAL_COMMUNITY)
Admission: RE | Admit: 2011-04-12 | Discharge: 2011-04-12 | Disposition: A | Discharge status: Home / Self Care | Payer: Self-pay | Source: Ambulatory Visit | Attending: Internal Medicine | Admitting: Internal Medicine

## 2011-04-12 ENCOUNTER — Encounter (HOSPITAL_COMMUNITY): Payer: Self-pay

## 2011-04-14 ENCOUNTER — Encounter (HOSPITAL_COMMUNITY): Payer: Self-pay

## 2011-04-17 ENCOUNTER — Encounter (HOSPITAL_COMMUNITY): Payer: Self-pay

## 2011-04-17 ENCOUNTER — Encounter (HOSPITAL_COMMUNITY)
Admission: RE | Admit: 2011-04-17 | Discharge: 2011-04-17 | Disposition: A | Discharge status: Home / Self Care | Payer: Self-pay | Source: Ambulatory Visit | Attending: Internal Medicine | Admitting: Internal Medicine

## 2011-04-19 ENCOUNTER — Encounter (HOSPITAL_COMMUNITY): Payer: Self-pay

## 2011-04-21 ENCOUNTER — Encounter (HOSPITAL_COMMUNITY): Payer: Self-pay

## 2011-04-21 DIAGNOSIS — I10 Essential (primary) hypertension: Secondary | ICD-10-CM | POA: Insufficient documentation

## 2011-04-21 DIAGNOSIS — E785 Hyperlipidemia, unspecified: Secondary | ICD-10-CM | POA: Insufficient documentation

## 2011-04-21 DIAGNOSIS — Z9861 Coronary angioplasty status: Secondary | ICD-10-CM | POA: Insufficient documentation

## 2011-04-21 DIAGNOSIS — Z5189 Encounter for other specified aftercare: Secondary | ICD-10-CM | POA: Insufficient documentation

## 2011-04-21 DIAGNOSIS — I209 Angina pectoris, unspecified: Secondary | ICD-10-CM | POA: Insufficient documentation

## 2011-04-21 DIAGNOSIS — I2510 Atherosclerotic heart disease of native coronary artery without angina pectoris: Secondary | ICD-10-CM | POA: Insufficient documentation

## 2011-04-24 ENCOUNTER — Encounter (HOSPITAL_COMMUNITY): Payer: Self-pay

## 2011-04-26 ENCOUNTER — Encounter (HOSPITAL_COMMUNITY): Payer: Self-pay

## 2011-04-28 ENCOUNTER — Encounter (HOSPITAL_COMMUNITY): Payer: Self-pay

## 2011-05-01 ENCOUNTER — Encounter (HOSPITAL_COMMUNITY)
Admission: RE | Admit: 2011-05-01 | Discharge: 2011-05-01 | Disposition: A | Discharge status: Home / Self Care | Payer: Self-pay | Source: Ambulatory Visit | Attending: Internal Medicine | Admitting: Internal Medicine

## 2011-05-01 ENCOUNTER — Encounter (HOSPITAL_COMMUNITY): Payer: Self-pay

## 2011-05-03 ENCOUNTER — Encounter (HOSPITAL_COMMUNITY): Payer: Self-pay

## 2011-05-03 ENCOUNTER — Encounter (HOSPITAL_COMMUNITY)
Admission: RE | Admit: 2011-05-03 | Discharge: 2011-05-03 | Disposition: A | Discharge status: Home / Self Care | Payer: Self-pay | Source: Ambulatory Visit | Attending: Internal Medicine | Admitting: Internal Medicine

## 2011-05-05 ENCOUNTER — Encounter (HOSPITAL_COMMUNITY): Payer: Self-pay

## 2011-05-08 ENCOUNTER — Encounter (HOSPITAL_COMMUNITY): Payer: Self-pay

## 2011-05-10 ENCOUNTER — Encounter (HOSPITAL_COMMUNITY): Payer: Self-pay

## 2011-05-12 ENCOUNTER — Encounter (HOSPITAL_COMMUNITY): Payer: Self-pay

## 2011-05-12 ENCOUNTER — Other Ambulatory Visit: Payer: Self-pay | Admitting: Ophthalmology

## 2011-05-12 NOTE — H&P (Signed)
Pre-operative History and Physical for Ophthalmic Surgery  MARAE COTTRELL 05/11/2011              Chief Complaint: New onset central scotoma OS  Diagnosis: Full Thickness Macular Hole OS, Nuclear Sclerotic Cataract OS Allergies:   no   Prior to Admission medications   Medication Sig Start Date End Date Taking? Authorizing Provider  aspirin 81 MG tablet Take 81 mg by mouth daily.      Historical Provider, MD  clopidogrel (PLAVIX) 75 MG tablet Take 75 mg by mouth daily.      Historical Provider, MD  nitroGLYCERIN (NITROSTAT) 0.4 MG SL tablet Place 0.4 mg under the tongue every 5 (five) minutes as needed.      Historical Provider, MD  pravastatin (PRAVACHOL) 20 MG tablet Take 20 mg by mouth daily.      Historical Provider, MD  Valsartan (DIOVAN PO) Take by mouth.     Historical Provider, MD    Planned Procedure: Pars Plana Vitrectomy, Membrane Peeling and Fluid Gas Exchange                                      Phacoemulsification, Posterior Chamber Intra-ocular Lens left Eye  There were no vitals filed for this visit.  Pulse: 76         Temp: NA        Resp:  16     ROS:  non-contributory  Past Medical History  Diagnosis Date  . Internal hemorrhoids without mention of complication   . Neutropenia, unspecified   . IBS (irritable bowel syndrome)   . Coronary artery disease   . Hyperlipidemia   . Chest pain   . Hypotension   . Stroke      History of left internal carotid artery dissection,  History of fibromuscular dysplasia  . Hypertension     Past Surgical History  Procedure Date  . Cardiac catheterization      History   Social History  . Marital Status: Married    Spouse Name: N/A    Number of Children: N/A  . Years of Education: N/A   Occupational History  . Not on file.   Social History Main Topics  . Smoking status: Never Smoker   . Smokeless tobacco: Not on file  . Alcohol Use: No  . Drug Use: No  . Sexually Active: Not on file   Other Topics  Concern  . Not on file   Social History Narrative  . No narrative on file     The following examination is for anesthesia clearance for minimally invasive Ophthalmic surgery. It is primarily to document heart and lung findings and is not intended to elucidate unknown general medical conditions inclusive of abdominal masses, lung lesions, etc.   General Constitution:  Within Normal Limits   Alertness/Orientation:  Person, time place     yes   HEENT:  Eye Findings: Full Thickness Macular Hole OS, Vitreo-macular Traction OD                   left eye  Neck: supple without masses  Chest/Lungs: clear to auscultation  Cardiac: Normal S1 and S2 without Murmur, S3 or S4  Neuro: non-focal   Impression: Macular Hole, Nuclear Sclerotic Cataract Left Eye  Planned Procedure:  Pars Plana Vitrectomy, Membrane Peeling and Fluid Gas Exchange                                      Phacoemulsification, Posterior Chamber Intra-ocular Lens left Clement Husbands, MD

## 2011-05-15 ENCOUNTER — Encounter (HOSPITAL_COMMUNITY)
Admission: RE | Admit: 2011-05-15 | Discharge: 2011-05-15 | Disposition: A | Discharge status: Home / Self Care | Payer: Self-pay | Source: Ambulatory Visit | Attending: Internal Medicine | Admitting: Internal Medicine

## 2011-05-15 ENCOUNTER — Encounter (HOSPITAL_COMMUNITY): Payer: Self-pay

## 2011-05-16 ENCOUNTER — Encounter (HOSPITAL_COMMUNITY): Payer: Self-pay | Admitting: Pharmacy Technician

## 2011-05-16 ENCOUNTER — Other Ambulatory Visit (HOSPITAL_COMMUNITY): Payer: Self-pay | Admitting: *Deleted

## 2011-05-16 NOTE — Pre-Procedure Instructions (Signed)
19 [name]  05/16/2011   Your procedure is scheduled on: Monday,April 1st   Report to [facility] at 7:30 AM.  Call this number if you have problems the morning of surgery: [phone]   Remember:   Do not eat food:After Midnight.  May have clear liquids: up to 4 Hours before arrival.  (3:30)  Clear liquids include soda, tea, black coffee, apple or grape juice, broth.   Take these medicines the morning of surgery with A SIP OF WATER: None   Do not wear jewelry, make-up or nail polish.  Do not wear lotions, powders, or perfumes. You may wear deodorant.  Do not shave 48 hours prior to surgery.  Do not bring valuables to the hospital.  Contacts, dentures or bridgework may not be worn into surgery.  Leave suitcase in the car. After surgery it may be brought to your room.  For patients admitted to the hospital, checkout time is 11:00 AM the day of discharge.   Patients discharged the day of surgery will not be allowed to drive home.  Name and phone number of your driver: --  Special Instructions: CHG Shower Use Special Wash: 1/2 bottle night before surgery and 1/2 bottle morning of surgery.   Please read over the following fact sheets that you were given: Pain Booklet, MRSA Information and Surgical Site Infection Prevention

## 2011-05-17 ENCOUNTER — Encounter (HOSPITAL_COMMUNITY): Payer: Self-pay

## 2011-05-17 ENCOUNTER — Ambulatory Visit (HOSPITAL_COMMUNITY)
Admission: RE | Admit: 2011-05-17 | Discharge: 2011-05-17 | Disposition: A | Discharge status: Home / Self Care | Payer: BC Managed Care – PPO | Source: Ambulatory Visit | Attending: Anesthesiology | Admitting: Anesthesiology

## 2011-05-17 ENCOUNTER — Other Ambulatory Visit (HOSPITAL_COMMUNITY): Payer: Self-pay | Admitting: *Deleted

## 2011-05-17 ENCOUNTER — Encounter (HOSPITAL_COMMUNITY)
Admission: RE | Admit: 2011-05-17 | Discharge: 2011-05-17 | Disposition: A | Discharge status: Home / Self Care | Payer: BC Managed Care – PPO | Source: Ambulatory Visit | Attending: Ophthalmology | Admitting: Ophthalmology

## 2011-05-17 ENCOUNTER — Encounter (HOSPITAL_COMMUNITY)
Admission: RE | Admit: 2011-05-17 | Payer: Self-pay | Source: Ambulatory Visit | Attending: Internal Medicine | Admitting: Internal Medicine

## 2011-05-17 DIAGNOSIS — Z01818 Encounter for other preprocedural examination: Secondary | ICD-10-CM | POA: Insufficient documentation

## 2011-05-17 HISTORY — DX: Cardiac murmur, unspecified: R01.1

## 2011-05-17 HISTORY — DX: Shortness of breath: R06.02

## 2011-05-17 HISTORY — DX: Gastro-esophageal reflux disease without esophagitis: K21.9

## 2011-05-17 LAB — BASIC METABOLIC PANEL
BUN: 20 mg/dL (ref 6–23)
Calcium: 9.5 mg/dL (ref 8.4–10.5)
GFR calc Af Amer: 66 mL/min — ABNORMAL LOW (ref >90)
GFR calc non Af Amer: 57 mL/min — ABNORMAL LOW (ref >90)

## 2011-05-17 LAB — CBC
HCT: 36.3 % (ref 36.0–46.0)
Hemoglobin: 12.5 g/dL (ref 12.0–15.0)
MCH: 29.8 pg (ref 26.0–34.0)
MCV: 86.6 fL (ref 78.0–100.0)
Platelets: 215 10*3/uL (ref 150–400)
RBC: 4.19 MIL/uL (ref 3.87–5.11)
WBC: 3.4 10*3/uL — ABNORMAL LOW (ref 4.0–10.5)

## 2011-05-19 ENCOUNTER — Encounter (HOSPITAL_COMMUNITY): Payer: Self-pay

## 2011-05-21 MED ORDER — PHENYLEPHRINE HCL 2.5 % OP SOLN
1.0000 [drp] | OPHTHALMIC | Status: AC | PRN
  Administered 2011-05-22 (×3): 1 [drp] via OPHTHALMIC
  Filled 2011-05-21: qty 3

## 2011-05-21 MED ORDER — TETRACAINE HCL 0.5 % OP SOLN
2.0000 [drp] | OPHTHALMIC | Status: AC
  Administered 2011-05-22: 2 [drp] via OPHTHALMIC
  Filled 2011-05-21: qty 2

## 2011-05-21 MED ORDER — GATIFLOXACIN 0.5 % OP SOLN
1.0000 [drp] | OPHTHALMIC | Status: AC | PRN
  Administered 2011-05-22 (×3): 1 [drp] via OPHTHALMIC
  Filled 2011-05-21: qty 2.5

## 2011-05-21 MED ORDER — PREDNISOLONE ACETATE 1 % OP SUSP
1.0000 [drp] | OPHTHALMIC | Status: AC
  Administered 2011-05-22: 1 [drp] via OPHTHALMIC
  Filled 2011-05-21: qty 5

## 2011-05-21 NOTE — Interval H&P Note (Signed)
Acrysof [id number] +16.50 PC IOL for implant OS.    Shade Flood, MD

## 2011-05-21 NOTE — H&P (View-Only) (Signed)
Pre-operative History and Physical for Ophthalmic Surgery  [name] 05/11/2011              Chief Complaint: New onset central scotoma OS  Diagnosis: Full Thickness Macular Hole OS, Nuclear Sclerotic Cataract OS Allergies:   no   Prior to Admission medications   Medication Sig Start Date End Date Taking? Authorizing Provider  aspirin 81 MG tablet Take 81 mg by mouth daily.      Historical Provider, MD  clopidogrel (PLAVIX) 75 MG tablet Take 75 mg by mouth daily.      Historical Provider, MD  nitroGLYCERIN (NITROSTAT) 0.4 MG SL tablet Place 0.4 mg under the tongue every 5 (five) minutes as needed.      Historical Provider, MD  pravastatin (PRAVACHOL) 20 MG tablet Take 20 mg by mouth daily.      Historical Provider, MD  Valsartan (DIOVAN PO) Take by mouth.     Historical Provider, MD    Planned Procedure: Pars Plana Vitrectomy, Membrane Peeling and Fluid Gas Exchange                                      Phacoemulsification, Posterior Chamber Intra-ocular Lens left Eye  There were no vitals filed for this visit.  Pulse: 76         Temp: NA        Resp:  16     ROS:  non-contributory  Past Medical History  Diagnosis Date  . Internal hemorrhoids without mention of complication   . Neutropenia, unspecified   . IBS (irritable bowel syndrome)   . Coronary artery disease   . Hyperlipidemia   . Chest pain   . Hypotension   . Stroke      History of left internal carotid artery dissection,  History of fibromuscular dysplasia  . Hypertension     Past Surgical History  Procedure Date  . Cardiac catheterization      History   Social History  . Marital Status: Married    Spouse Name: N/A    Number of Children: N/A  . Years of Education: N/A   Occupational History  . Not on file.   Social History Main Topics  . Smoking status: Never Smoker   . Smokeless tobacco: Not on file  . Alcohol Use: No  . Drug Use: No  . Sexually Active: Not on file   Other Topics  Concern  . Not on file   Social History Narrative  . No narrative on file     The following examination is for anesthesia clearance for minimally invasive Ophthalmic surgery. It is primarily to document heart and lung findings and is not intended to elucidate unknown general medical conditions inclusive of abdominal masses, lung lesions, etc.   General Constitution:  Within Normal Limits   Alertness/Orientation:  Person, time place     yes   HEENT:  Eye Findings: Full Thickness Macular Hole OS, Vitreo-macular Traction OD                   left eye  Neck: supple without masses  Chest/Lungs: clear to auscultation  Cardiac: Normal S1 and S2 without Murmur, S3 or S4  Neuro: non-focal   Impression: Macular Hole, Nuclear Sclerotic Cataract Left Eye  Planned Procedure:  Pars Plana Vitrectomy, Membrane Peeling and Fluid Gas Exchange                                      Phacoemulsification, Posterior Chamber Intra-ocular Lens left Clement Husbands, MD

## 2011-05-22 ENCOUNTER — Encounter (HOSPITAL_COMMUNITY): Payer: Self-pay | Admitting: Anesthesiology

## 2011-05-22 ENCOUNTER — Ambulatory Visit (HOSPITAL_COMMUNITY): Payer: BC Managed Care – PPO | Admitting: Anesthesiology

## 2011-05-22 ENCOUNTER — Ambulatory Visit (HOSPITAL_COMMUNITY)
Admission: RE | Admit: 2011-05-22 | Discharge: 2011-05-22 | Disposition: A | Discharge status: Home / Self Care | Payer: BC Managed Care – PPO | Source: Ambulatory Visit | Attending: Ophthalmology | Admitting: Ophthalmology

## 2011-05-22 ENCOUNTER — Encounter (HOSPITAL_COMMUNITY)
Admission: RE | Disposition: A | Discharge status: Home / Self Care | Payer: Self-pay | Source: Ambulatory Visit | Attending: Ophthalmology

## 2011-05-22 ENCOUNTER — Encounter (HOSPITAL_COMMUNITY): Payer: Self-pay | Attending: Internal Medicine

## 2011-05-22 ENCOUNTER — Encounter (HOSPITAL_COMMUNITY): Payer: Self-pay | Admitting: *Deleted

## 2011-05-22 DIAGNOSIS — K219 Gastro-esophageal reflux disease without esophagitis: Secondary | ICD-10-CM | POA: Insufficient documentation

## 2011-05-22 DIAGNOSIS — Z8673 Personal history of transient ischemic attack (TIA), and cerebral infarction without residual deficits: Secondary | ICD-10-CM | POA: Insufficient documentation

## 2011-05-22 DIAGNOSIS — Z9861 Coronary angioplasty status: Secondary | ICD-10-CM | POA: Insufficient documentation

## 2011-05-22 DIAGNOSIS — I209 Angina pectoris, unspecified: Secondary | ICD-10-CM | POA: Insufficient documentation

## 2011-05-22 DIAGNOSIS — E785 Hyperlipidemia, unspecified: Secondary | ICD-10-CM | POA: Insufficient documentation

## 2011-05-22 DIAGNOSIS — H2510 Age-related nuclear cataract, unspecified eye: Secondary | ICD-10-CM | POA: Insufficient documentation

## 2011-05-22 DIAGNOSIS — I2510 Atherosclerotic heart disease of native coronary artery without angina pectoris: Secondary | ICD-10-CM | POA: Insufficient documentation

## 2011-05-22 DIAGNOSIS — R0602 Shortness of breath: Secondary | ICD-10-CM | POA: Insufficient documentation

## 2011-05-22 DIAGNOSIS — I10 Essential (primary) hypertension: Secondary | ICD-10-CM | POA: Insufficient documentation

## 2011-05-22 DIAGNOSIS — Z5189 Encounter for other specified aftercare: Secondary | ICD-10-CM | POA: Insufficient documentation

## 2011-05-22 DIAGNOSIS — H35419 Lattice degeneration of retina, unspecified eye: Secondary | ICD-10-CM | POA: Insufficient documentation

## 2011-05-22 DIAGNOSIS — H35349 Macular cyst, hole, or pseudohole, unspecified eye: Secondary | ICD-10-CM | POA: Insufficient documentation

## 2011-05-22 LAB — SURGICAL PCR SCREEN: Staphylococcus aureus: NEGATIVE

## 2011-05-22 SURGERY — EXTRACTION, CATARACT, WITH VITRECTOMY
Anesthesia: General | Site: Eye | Laterality: Left | Wound class: Clean

## 2011-05-22 MED ORDER — FENTANYL CITRATE 0.05 MG/ML IJ SOLN: 50.0000 ug | INTRAMUSCULAR | Status: DC | PRN

## 2011-05-22 MED ORDER — CEFAZOLIN SUBCONJUNCTIVAL INJECTION 100 MG/0.5 ML
200.0000 mg | INJECTION | Freq: Once | SUBCONJUNCTIVAL | Status: DC
  Filled 2011-05-22: qty 1

## 2011-05-22 MED ORDER — MUPIROCIN 2 % EX OINT
TOPICAL_OINTMENT | Freq: Two times a day (BID) | CUTANEOUS | Status: DC
Start: 2011-05-22 — End: 2011-05-22
  Administered 2011-05-22: 1 via NASAL
  Filled 2011-05-22: qty 22

## 2011-05-22 MED ORDER — ONDANSETRON HCL 4 MG/2ML IJ SOLN
INTRAMUSCULAR | Status: DC | PRN
  Administered 2011-05-22: 4 mg via INTRAVENOUS

## 2011-05-22 MED ORDER — PROPOFOL 10 MG/ML IV EMUL
INTRAVENOUS | Status: DC | PRN
  Administered 2011-05-22: 130 mg via INTRAVENOUS

## 2011-05-22 MED ORDER — INDOCYANINE GREEN 25 MG IV SOLR
5.0000 mg | Freq: Once | INTRAVENOUS | Status: DC
  Filled 2011-05-22: qty 25

## 2011-05-22 MED ORDER — LORAZEPAM 2 MG/ML IJ SOLN: 1.0000 mg | Freq: Once | INTRAMUSCULAR | Status: DC | PRN

## 2011-05-22 MED ORDER — EPHEDRINE SULFATE 50 MG/ML IJ SOLN
INTRAMUSCULAR | Status: DC | PRN
  Administered 2011-05-22 (×2): 5 mg via INTRAVENOUS

## 2011-05-22 MED ORDER — EPINEPHRINE HCL 1 MG/ML IJ SOLN
INTRAMUSCULAR | Status: DC | PRN
  Administered 2011-05-22: .3 mL

## 2011-05-22 MED ORDER — DEXAMETHASONE SODIUM PHOSPHATE 10 MG/ML IJ SOLN
INTRAMUSCULAR | Status: DC | PRN
  Administered 2011-05-22: 10 mg

## 2011-05-22 MED ORDER — LACTATED RINGERS IV SOLN
INTRAVENOUS | Status: DC
  Administered 2011-05-22: 09:00:00 via INTRAVENOUS

## 2011-05-22 MED ORDER — GLYCOPYRROLATE 0.2 MG/ML IJ SOLN
INTRAMUSCULAR | Status: DC | PRN
  Administered 2011-05-22: .2 mg via INTRAVENOUS

## 2011-05-22 MED ORDER — MIDAZOLAM HCL 2 MG/2ML IJ SOLN: 1.0000 mg | INTRAMUSCULAR | Status: DC | PRN

## 2011-05-22 MED ORDER — BACITRACIN-POLYMYXIN B 500-10000 UNIT/GM OP OINT
TOPICAL_OINTMENT | OPHTHALMIC | Status: DC | PRN
  Administered 2011-05-22: 1 via OPHTHALMIC

## 2011-05-22 MED ORDER — HYPROMELLOSE (GONIOSCOPIC) 2.5 % OP SOLN
OPHTHALMIC | Status: DC | PRN
  Administered 2011-05-22: 2 [drp] via OPHTHALMIC

## 2011-05-22 MED ORDER — FENTANYL CITRATE 0.05 MG/ML IJ SOLN
INTRAMUSCULAR | Status: DC | PRN
  Administered 2011-05-22 (×2): 50 ug via INTRAVENOUS

## 2011-05-22 MED ORDER — LIDOCAINE HCL (CARDIAC) 20 MG/ML IV SOLN
INTRAVENOUS | Status: DC | PRN
  Administered 2011-05-22: 50 mg via INTRAVENOUS

## 2011-05-22 MED ORDER — BSS PLUS IO SOLN
INTRAOCULAR | Status: DC | PRN
  Administered 2011-05-22: 1 via INTRAOCULAR

## 2011-05-22 MED ORDER — LACTATED RINGERS IV SOLN
INTRAVENOUS | Status: DC | PRN
  Administered 2011-05-22 (×2): via INTRAVENOUS

## 2011-05-22 MED ORDER — MUPIROCIN 2 % EX OINT
TOPICAL_OINTMENT | CUTANEOUS | Status: AC
  Administered 2011-05-22: 1 via NASAL
  Filled 2011-05-22: qty 22

## 2011-05-22 MED ORDER — HYDROMORPHONE HCL PF 1 MG/ML IJ SOLN: 0.2500 mg | INTRAMUSCULAR | Status: DC | PRN

## 2011-05-22 MED ORDER — BSS IO SOLN
INTRAOCULAR | Status: DC | PRN
  Administered 2011-05-22: 15 mL via INTRAOCULAR

## 2011-05-22 MED ORDER — BUPIVACAINE HCL 0.75 % IJ SOLN
INTRAMUSCULAR | Status: DC | PRN
  Administered 2011-05-22: 5 mL

## 2011-05-22 MED ORDER — VECURONIUM BROMIDE 10 MG IV SOLR
INTRAVENOUS | Status: DC | PRN
  Administered 2011-05-22: 5 mg via INTRAVENOUS
  Administered 2011-05-22: 3 mg via INTRAVENOUS
  Administered 2011-05-22: 1 mg via INTRAVENOUS
  Administered 2011-05-22: 3 mg via INTRAVENOUS
  Administered 2011-05-22: 1 mg via INTRAVENOUS

## 2011-05-22 SURGICAL SUPPLY — 66 items
APPLICATOR COTTON TIP 6IN STRL (MISCELLANEOUS) ×2 IMPLANT
APPLICATOR DR MATTHEWS STRL (MISCELLANEOUS) IMPLANT
BAG MINI COLL DRAIN (WOUND CARE) IMPLANT
BLADE EYE MINI 60D BEAVER (BLADE) IMPLANT
BLADE KERATOME 2.75 (BLADE) ×2 IMPLANT
BLADE MVR KNIFE 19G (BLADE) IMPLANT
BLADE STAB KNIFE 15DEG (BLADE) ×2 IMPLANT
CANNULA DUAL BORE 23G (CANNULA) IMPLANT
CANNULA FLEX TIP 23G (CANNULA) ×2 IMPLANT
CLOTH BEACON ORANGE TIMEOUT ST (SAFETY) ×2 IMPLANT
CORDS BIPOLAR (ELECTRODE) IMPLANT
DRAPE OPHTHALMIC 77X100 STRL (CUSTOM PROCEDURE TRAY) ×2 IMPLANT
DRAPE POUCH INSTRU U-SHP 10X18 (DRAPES) ×2 IMPLANT
DRSG TEGADERM 4X4.75 (GAUZE/BANDAGES/DRESSINGS) ×2 IMPLANT
FILTER BLUE MILLIPORE (MISCELLANEOUS) ×4 IMPLANT
GAS OPHTHALMIC (MISCELLANEOUS) ×2 IMPLANT
GLOVE SS BIOGEL STRL SZ 6.5 (GLOVE) ×2 IMPLANT
GLOVE SUPERSENSE BIOGEL SZ 6.5 (GLOVE) ×2
GOWN STRL NON-REIN LRG LVL3 (GOWN DISPOSABLE) ×4 IMPLANT
ILLUMINATOR WIDEFIELD DIFF (MISCELLANEOUS) ×2 IMPLANT
KIT BASIN OR (CUSTOM PROCEDURE TRAY) ×4 IMPLANT
KIT ROOM TURNOVER OR (KITS) ×2 IMPLANT
KNIFE CRESCENT 1.75 EDGEAHEAD (BLADE) IMPLANT
KNIFE GRIESHABER SHARP 2.5MM (MISCELLANEOUS) ×2 IMPLANT
LENS IOL ACRYSOF MP POST 16.5 (Intraocular Lens) ×2 IMPLANT
MARKER SKIN DUAL TIP RULER LAB (MISCELLANEOUS) IMPLANT
NEEDLE 18GX1X1/2 (RX/OR ONLY) (NEEDLE) ×6 IMPLANT
NEEDLE 22X1 1/2 (OR ONLY) (NEEDLE) ×2 IMPLANT
NEEDLE 25GX 5/8IN NON SAFETY (NEEDLE) ×2 IMPLANT
NEEDLE FILTER BLUNT 18X 1/2SAF (NEEDLE) ×1
NEEDLE FILTER BLUNT 18X1 1/2 (NEEDLE) ×1 IMPLANT
NEEDLE HYPO 30X.5 LL (NEEDLE) ×4 IMPLANT
NS IRRIG 1000ML POUR BTL (IV SOLUTION) ×2 IMPLANT
PACK COMBINED CATERACT/VIT 23G (OPHTHALMIC RELATED) ×2 IMPLANT
PACK VITRECTOMY CUSTOM (CUSTOM PROCEDURE TRAY) ×2 IMPLANT
PACK VITRECTOMY PIC MCHSVP (PACKS) IMPLANT
PAD ARMBOARD 7.5X6 YLW CONV (MISCELLANEOUS) ×4 IMPLANT
PAD EYE OVAL STERILE LF (GAUZE/BANDAGES/DRESSINGS) ×2 IMPLANT
PAK VITRECTOMY PIK  23GA (OPHTHALMIC RELATED) IMPLANT
PHACO TIP KELMAN 45DEG (TIP) ×2 IMPLANT
PROBE DIRECTIONAL LASER (MISCELLANEOUS) ×2 IMPLANT
ROLLS DENTAL (MISCELLANEOUS) IMPLANT
SCRAPER DIAMOND DUST MEMBRANE (MISCELLANEOUS) ×2 IMPLANT
SHUTTLE MONARCH TYPE A (NEEDLE) ×2 IMPLANT
SOLUTION ANTI FOG 6CC (MISCELLANEOUS) ×2 IMPLANT
SPEAR EYE SURG WECK-CEL (MISCELLANEOUS) ×4 IMPLANT
SUT ETHILON 10-0 CS-B-6CS-B-6 (SUTURE)
SUT ETHILON 4 0 P 3 18 (SUTURE) IMPLANT
SUT ETHILON 5 0 P 3 18 (SUTURE)
SUT ETHILON 9 0 TG140 8 (SUTURE) IMPLANT
SUT NYLON 10.0 BLK (SUTURE) ×2 IMPLANT
SUT NYLON ETHILON 5-0 P-3 1X18 (SUTURE) IMPLANT
SUT PLAIN 6 0 TG1408 (SUTURE) IMPLANT
SUT POLY NON ABSORB 10-0 8 STR (SUTURE) IMPLANT
SUT VICRYL 7 0 TG140 8 (SUTURE) IMPLANT
SUT VICRYL ABS 6-0 S29 18IN (SUTURE) IMPLANT
SUTURE EHLN 10-0 CS-B-6CS-B-6 (SUTURE) IMPLANT
SYR 20CC LL (SYRINGE) ×2 IMPLANT
SYR 5ML LL (SYRINGE) IMPLANT
SYR TB 1ML LUER SLIP (SYRINGE) ×4 IMPLANT
SYRINGE 10CC LL (SYRINGE) IMPLANT
TOWEL OR 17X24 6PK STRL BLUE (TOWEL DISPOSABLE) ×6 IMPLANT
TUBE EXTENSION HAMMER (TUBING) IMPLANT
TUBING HIGH PRESS EXTEN 6IN (TUBING) IMPLANT
WATER STERILE IRR 1000ML POUR (IV SOLUTION) ×2 IMPLANT
WIPE INSTRUMENT VISIWIPE 73X73 (MISCELLANEOUS) IMPLANT

## 2011-05-22 NOTE — Interval H&P Note (Signed)
History and Physical Interval Note:  05/22/2011 9:23 AM  [name]  has presented today for surgery, with the diagnosis of Macular Hole, Cataract Left Eye  The various methods of treatment have been discussed with the patient and family. After consideration of risks, benefits and other options for treatment, the patient has consented to  Procedure(s) (LRB): VITRECTOMY AND CATARACT (Left) as a surgical intervention .  The patients' history has been reviewed, patient examined, no change in status, stable for surgery.  I have reviewed the patients' chart and labs.  Questions were answered to the patient's satisfaction.     Shade Flood,  MD

## 2011-05-22 NOTE — Op Note (Signed)
[name] 05/22/2011 Nuclear Cataract, Macular Hole Left Eye  Procedure: Phacoemulsification, Posterior Chamber Intra-ocular Lens, Pars Plana Vitrectomy , Membrane Peeling, Fluid Gas Exchange. 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 A Cook lid speculum was placed. A peripheral clear corneal incision was made at the surgical limbus centered at the 11:00 meridian. A separate clear corneal stab incision was made with a 15 degree blade at the 2:00 meridian to permit bi-manual technique. Provisc was instilled into the anterior chamber through that incision.  A keratome was used to create a self sealing incision entering the anterior chamber at the 11:00 meridian. A capsulorhexis was performed using a bent 25g needle. The lens was hydrodissected and the nucleus was hydrodilineated using a Nichammin cannula. The Chang chopper was inserted and used to rotate the lens to insure adequate lens mobility. The phacoemulsification handpiece was inserted and a combined phaco-chop technique was employed, fracturing the lens into separate sections with subsequent removal with the phaco handpiece.   The I/A cannula was used to remove remaining lens cortex. Provisc was instilled and used to deepen the anterior chamber and posterior capsule bag. The Monarch injector was used to place a folded Acrysof [id number] PC IOL, + 16.50  diopters, into the capsule bag. A Sinskey lens hook was used to dial in the trailing haptic.  The I/A cannula was used to remove the viscoelastic from the anterior chamber. BSS was used to bring IOP to the desired range and the wound was checked to insure it was watertight. Subconjunctival injections of Ancef 100/0.57ml and Dexamethasone 4mg /75ml were placed without  complication. 23g trocars were placed 3.5 mm posterior to the surgical limbus. The infusion line was placed with 23g cannula. Trocars were then placed at 2:30 and 9:30. Core vitrectomy was carried out with care taken to separate the vitreous from the posterior retina.  Total air/Fluid Exchange was carried out with a flex tipped cannula. ICG was placed on the macular surface and was removed after 30 seconds. BSS was replaced in the posterior chamber. A diamond dusted silicone brush was used to elevate the ILM and the 23g pick forceps were used to peel the ILM in a circumferential manner around the macular hole. Once the ILM was completely removed, total air fluid exchange was carried out. The cannulas were removed from the 9:30 and 2:30 positions with concommitant tamponade using a cotton tipped applicator. Focal Laser was applied to a supeiror chorioretinal scar which is associated with lattice degeneration. This was done to prophylax against retinal detachment.  Subconjunctival injections of Ancef 100mg /0.22ml and Dexamethasone 4mg /32ml ,  were placed in the infero-medial quadrant to avoid proximity to the cannula sites. Triamcinolone Acetate 40mg /22ml was placed in a posterior subtenons fashion to address edema in the margins of the [facility].  The eye was patched with Polymyxin Bacitracin Ophthalmic Ointment and a plastic shield was placed. She was transferred alert and conversant from the operating room to the post-operative recovery area.

## 2011-05-22 NOTE — Anesthesia Preprocedure Evaluation (Addendum)
Anesthesia Evaluation  Patient identified by MRN, date of birth, ID band Patient awake    Reviewed: Allergy & Precautions, H&P , NPO status , Patient's Chart, lab work & pertinent test results  Airway Mallampati: II TM Distance: >3 FB Neck ROM: Full    Dental  (+) Dental Advidsory Given   Pulmonary shortness of breath and at rest,    Pulmonary exam normal       Cardiovascular hypertension, + CAD     Neuro/Psych  Neuromuscular disease CVA    GI/Hepatic GERD-  ,  Endo/Other    Renal/GU      Musculoskeletal   Abdominal (+) + obese,   Peds  Hematology   Anesthesia Other Findings   Reproductive/Obstetrics                         Anesthesia Physical Anesthesia Plan  ASA: III  Anesthesia Plan: General   Post-op Pain Management:    Induction: Intravenous  Airway Management Planned: Oral ETT  Additional Equipment:   Intra-op Plan:   Post-operative Plan: Extubation in OR  Informed Consent: I have reviewed the patients History and Physical, chart, labs and discussed the procedure including the risks, benefits and alternatives for the proposed anesthesia with the patient or authorized representative who has indicated his/her understanding and acceptance.   Dental Advisory Given  Plan Discussed with: CRNA, Surgeon and Anesthesiologist  Anesthesia Plan Comments:        Anesthesia Quick Evaluation

## 2011-05-22 NOTE — Anesthesia Postprocedure Evaluation (Signed)
  Anesthesia Post-op Note  Patient: [name]  Procedure(s) Performed: Procedure(s) (LRB): VITRECTOMY AND CATARACT (Left)  Patient Location: PACU  Anesthesia Type: General  Level of Consciousness: awake  Airway and Oxygen Therapy: Patient Spontanous Breathing  Post-op Pain: mild  Post-op Assessment: Post-op Vital signs reviewed, Patient's Cardiovascular Status Stable, Respiratory Function Stable, Patent Airway, No signs of Nausea or vomiting and Pain level controlled  Post-op Vital Signs: stable  Complications: No apparent anesthesia complications

## 2011-05-22 NOTE — Anesthesia Procedure Notes (Signed)
Procedure Name: Intubation Date/Time: 05/22/2011 10:01 AM Performed by: Carmela Rima Pre-anesthesia Checklist: Emergency Drugs available, Patient identified, Timeout performed, Suction available and Patient being monitored Patient Re-evaluated:Patient Re-evaluated prior to inductionOxygen Delivery Method: Circle system utilized Preoxygenation: Pre-oxygenation with 100% oxygen Intubation Type: IV induction Ventilation: Mask ventilation without difficulty Laryngoscope Size: Mac and 3 Grade View: Grade II Tube type: Oral Tube size: 7.5 mm Number of attempts: 2 Placement Confirmation: ETT inserted through vocal cords under direct vision,  positive ETCO2 and breath sounds checked- equal and bilateral Secured at: 22 cm Tube secured with: Tape Dental Injury: Injury to lip

## 2011-05-22 NOTE — Transfer of Care (Signed)
Immediate Anesthesia Transfer of Care Note  Patient: [name]  Procedure(s) Performed: Procedure(s) (LRB): VITRECTOMY AND CATARACT (Left)  Patient Location: PACU  Anesthesia Type: General  Level of Consciousness: awake  Airway & Oxygen Therapy: Patient Spontanous Breathing and Patient connected to nasal cannula oxygen  Post-op Assessment: Report given to PACU RN, Post -op Vital signs reviewed and stable and Patient moving all extremities X 4  Post vital signs: Reviewed and stable  Complications: No apparent anesthesia complications

## 2011-05-22 NOTE — Discharge Instructions (Addendum)
[name]      05/22/2011  Post-operative instructions for [name] [name], MD  Caring for your eye:  Do not rub your eye and wash your hands before touching the eye area. This is important to avoid injury and infection.  You may use sterile gauze pads and sterile eye wash to cleanse the lid margins of mucous accumulation.  DO NOT REUSE GAUZE after wiping the eye. Use a new clean one if needed.  Be certain not to touch the top of the medication bottle to the eyelids to avoid contaminating your medicine bottle and causing infection.  After eye surgery the surface of the eye and eyelids may be puffy. You may note a red blotch(s) on the surface of the eye or a bruise on your eyelid. These are usually related to injections or instruments used in surgery and are not cause for alarm. You may also notice blood tinged tears on your eye pad, this is common and not cause for alarm. These findings will subside over the coming week or two.  Activity:  No jarring activities. Walking with assistance early on as needed is advised. Avoid straining and let me know if you have significant constipation. Do not bend over at the waist with the head below your waist to minimize risk of bleeding inside the eye.  Avoid getting water from washing your hair or showering  in your eye. Patch the eye if necessary during bathing to avoid contamination.    You may: watch television, work on you computer, read books, eat out, ride in a car.  Sleeping Position: You may sleep on either side at night as well as on your stomach, just NOT on your back!     **  You DO have a gas bubble in your eye, DO NOT SLEEP ON YOUR BACK!!!              Doing so can cause a very high eye pressure and cause eye injury.    DO NOT travel in an airplane or drive to altitude above 1610 ft above sea level if you have a gas bubble in your eye. This also can cause very high eye pressure.  Wear your eye shield for naps and sleeping at night  for the first two weeks.   Wait a few minutes between your eye drops when placing them.   Resume your customary medications on your normal schedule. Shade Flood MD    After office hours I can be reached by calling: [phone]                                                                     or   [phone] Instructions Following General Anesthetic, Adult A nurse specialized in giving anesthesia (anesthetist) or a doctor specialized in giving anesthesia (anesthesiologist) gave you a medicine that made you sleep while a procedure was performed. For as long as 24 hours following this procedure, you may feel:  Dizzy.   Weak.   Drowsy.  AFTER THE PROCEDURE After surgery, you will be taken to the recovery area where a nurse will monitor your progress. You will be allowed to go home when you are awake, stable, taking fluids well, and without complications. For the first  24 hours following an anesthetic:  Have a responsible person with you.   Do not drive a car. If you are alone, do not take public transportation.   Do not drink alcohol.   Do not take medicine that has not been prescribed by your caregiver.   Do not sign important papers or make important decisions.   You may resume normal diet and activities as directed.   Change bandages (dressings) as directed.   Only take over-the-counter or prescription medicines for pain, discomfort, or fever as directed by your caregiver.  If you have questions or problems that seem related to the anesthetic, call the hospital and ask for the anesthetist or anesthesiologist on call. SEEK IMMEDIATE MEDICAL CARE IF:   You develop a rash.   You have difficulty breathing.   You have chest pain.   You develop any allergic problems.  Document Released: 05/15/2000 Document Revised: 01/26/2011 Document Reviewed: 12/24/2006 [facility] Patient Information 2012 [place], [place].

## 2011-05-24 ENCOUNTER — Encounter (HOSPITAL_COMMUNITY): Payer: Self-pay

## 2011-05-26 ENCOUNTER — Encounter (HOSPITAL_COMMUNITY): Payer: Self-pay

## 2011-05-29 ENCOUNTER — Encounter (HOSPITAL_COMMUNITY): Payer: Self-pay

## 2011-05-31 ENCOUNTER — Encounter (HOSPITAL_COMMUNITY): Payer: Self-pay

## 2011-06-02 ENCOUNTER — Encounter (HOSPITAL_COMMUNITY): Payer: Self-pay

## 2011-06-05 ENCOUNTER — Encounter (HOSPITAL_COMMUNITY): Payer: Self-pay

## 2011-06-07 ENCOUNTER — Encounter (HOSPITAL_COMMUNITY): Payer: Self-pay

## 2011-06-09 ENCOUNTER — Encounter (HOSPITAL_COMMUNITY): Payer: Self-pay

## 2011-06-09 NOTE — H&P (Signed)
Pre-operative History and Physical for Ophthalmic Surgery  [name] 06/09/2011                  Chief Complaint: Central vision loss  Diagnosis: Macular Hole OS  Allergies  Allergen Reactions  . Nalfon       Prior to Admission medications   Medication Sig Start Date End Date Taking? Authorizing Provider  aspirin 81 MG tablet Take 81 mg by mouth daily.      Historical Provider, MD  Cholecalciferol (VITAMIN D-3 PO) Take 1 capsule by mouth daily.    Historical Provider, MD  clopidogrel (PLAVIX) 75 MG tablet Take 75 mg by mouth daily.      Historical Provider, MD  nitroGLYCERIN (NITROSTAT) 0.4 MG SL tablet Place 0.4 mg under the tongue every 5 (five) minutes as needed. For chest pain    Historical Provider, MD  pravastatin (PRAVACHOL) 20 MG tablet Take 20 mg by mouth daily.     Historical Provider, MD  valsartan-hydrochlorothiazide (DIOVAN-HCT) 80-12.5 MG per tablet Take 1 tablet by mouth Daily. 05/09/11   Historical Provider, MD    Planned Procedure: Pars Plana Vitrectomy, Membrane Peeling, Fluid Gas Exchange and Silicone Oil                                     Left Eye  There were no vitals filed for this visit.  Pulse: 70         Temp: NE        Resp:  16    ROS: negative apart from left eye  Past Medical History  Diagnosis Date  . Internal hemorrhoids without mention of complication   . Neutropenia, unspecified   . IBS (irritable bowel syndrome)   . Coronary artery disease   . Hyperlipidemia   . Chest pain   . Hypotension   . Stroke      History of left internal carotid artery dissection,  History of fibromuscular dysplasia  . Hypertension   . Shortness of breath   . Heart murmur   . GERD (gastroesophageal reflux disease)   . Neuromuscular disorder     Fibromuscular dysplaesi    Past Surgical History  Procedure Date  . Cardiac catheterization   . Cardiac stents 2010  . Cholecystectomy   . Abdominal hysterectomy      History   Social History  .  Marital Status: Married    Spouse Name: N/A    Number of Children: N/A  . Years of Education: N/A   Occupational History  . Not on file.   Social History Main Topics  . Smoking status: Never Smoker   . Smokeless tobacco: Not on file  . Alcohol Use: No  . Drug Use: No  . Sexually Active: Not on file   Other Topics Concern  . Not on file   Social History Narrative  . No narrative on file     The following examination is for anesthesia clearance for minimally invasive Ophthalmic surgery. It is primarily to document heart and lung findings and is not intended to elucidate unknown general medical conditions inclusive of abdominal masses, lung lesions, etc.   General Constitution:  within normal limits   Alertness/Orientation:  Person, time place     yes   HEENT:  Eye Findings: Macular Hole OS  left eye  Neck: supple without masses  Chest/Lungs: clear to auscultation  Cardiac: Normal S1 and S2 without Murmur, S3 or S4  Neuro: non-focal   Impression: Macular Hole Left Eye  Planned Procedure:  Pars Plana Vitrectomy, Membrane Peeling, Fluid Gas Exchange                                       Silicone Oil Left Eye    Shade Flood, MD

## 2011-06-12 ENCOUNTER — Encounter (HOSPITAL_COMMUNITY): Payer: Self-pay

## 2011-06-14 ENCOUNTER — Encounter (HOSPITAL_COMMUNITY): Payer: Self-pay

## 2011-06-16 ENCOUNTER — Encounter (HOSPITAL_COMMUNITY): Payer: Self-pay

## 2011-06-19 ENCOUNTER — Encounter (HOSPITAL_COMMUNITY): Payer: Self-pay

## 2011-06-20 ENCOUNTER — Encounter (HOSPITAL_COMMUNITY)
Admission: RE | Admit: 2011-06-20 | Discharge: 2011-06-20 | Disposition: A | Discharge status: Home / Self Care | Payer: BC Managed Care – PPO | Source: Ambulatory Visit | Attending: Ophthalmology | Admitting: Ophthalmology

## 2011-06-20 ENCOUNTER — Encounter (HOSPITAL_COMMUNITY): Payer: Self-pay

## 2011-06-20 HISTORY — DX: Headache: R51

## 2011-06-20 LAB — CBC
MCV: 84.9 fL (ref 78.0–100.0)
Platelets: 168 10*3/uL (ref 150–400)
RBC: 4.16 MIL/uL (ref 3.87–5.11)
WBC: 4.5 10*3/uL (ref 4.0–10.5)

## 2011-06-20 LAB — BASIC METABOLIC PANEL
CO2: 29 mEq/L (ref 19–32)
Chloride: 105 mEq/L (ref 96–112)
Creatinine, Ser: 1.09 mg/dL (ref 0.50–1.10)
Sodium: 142 mEq/L (ref 135–145)

## 2011-06-20 NOTE — Pre-Procedure Instructions (Signed)
20 [name]  06/20/2011   Your procedure is scheduled on:  Jun 26, 2011 @ 0715  Report to [facility] at 0530 AM.  Call this number if you have problems the morning of surgery: [phone]   Remember:   Do not eat food:After Midnight.  May have clear liquids: up to 4 Hours before arrival.  Clear liquids include soda, tea, black coffee, apple or grape juice, broth.  Take these medicines the morning of surgery with A SIP OF WATER: none   Do not wear jewelry, make-up or nail polish.  Do not wear lotions, powders, or perfumes.   Do not shave 48 hours prior to surgery.  Do not bring valuables to the hospital.  Contacts, dentures or bridgework may not be worn into surgery.  Leave suitcase in the car. After surgery it may be brought to your room.  For patients admitted to the hospital, checkout time is 11:00 AM the day of discharge.   Patients discharged the day of surgery will not be allowed to drive home.  Special Instructions: CHG Shower Use Special Wash: 1/2 bottle night before surgery and 1/2 bottle morning of surgery.   Please read over the following fact sheets that you were given: Pain Booklet, Coughing and Deep Breathing, MRSA Information and Surgical Site Infection Prevention

## 2011-06-20 NOTE — Progress Notes (Signed)
Primary Physician.  Dr. Ricki Miller [facility]  Cardiologist - Dr. Jones Broom.  Echo 2013 in epic, cath 2008 in epic, stress test 2011 in epic

## 2011-06-21 ENCOUNTER — Encounter (HOSPITAL_COMMUNITY): Payer: Self-pay | Attending: Internal Medicine

## 2011-06-21 DIAGNOSIS — Z9861 Coronary angioplasty status: Secondary | ICD-10-CM | POA: Insufficient documentation

## 2011-06-21 DIAGNOSIS — E785 Hyperlipidemia, unspecified: Secondary | ICD-10-CM | POA: Insufficient documentation

## 2011-06-21 DIAGNOSIS — I2510 Atherosclerotic heart disease of native coronary artery without angina pectoris: Secondary | ICD-10-CM | POA: Insufficient documentation

## 2011-06-21 DIAGNOSIS — I209 Angina pectoris, unspecified: Secondary | ICD-10-CM | POA: Insufficient documentation

## 2011-06-21 DIAGNOSIS — Z5189 Encounter for other specified aftercare: Secondary | ICD-10-CM | POA: Insufficient documentation

## 2011-06-21 DIAGNOSIS — I10 Essential (primary) hypertension: Secondary | ICD-10-CM | POA: Insufficient documentation

## 2011-06-23 ENCOUNTER — Encounter (HOSPITAL_COMMUNITY): Payer: Self-pay

## 2011-06-25 MED ORDER — GATIFLOXACIN 0.5 % OP SOLN
1.0000 [drp] | OPHTHALMIC | Status: AC | PRN
  Administered 2011-06-26 (×3): 1 [drp] via OPHTHALMIC
  Filled 2011-06-25: qty 2.5

## 2011-06-25 MED ORDER — PREDNISOLONE ACETATE 1 % OP SUSP
1.0000 [drp] | OPHTHALMIC | Status: AC
  Administered 2011-06-26: 1 [drp] via OPHTHALMIC
  Filled 2011-06-25: qty 5

## 2011-06-25 MED ORDER — PHENYLEPHRINE HCL 2.5 % OP SOLN
1.0000 [drp] | OPHTHALMIC | Status: AC | PRN
  Administered 2011-06-26 (×3): 1 [drp] via OPHTHALMIC
  Filled 2011-06-25: qty 3

## 2011-06-25 MED ORDER — TETRACAINE HCL 0.5 % OP SOLN
2.0000 [drp] | OPHTHALMIC | Status: AC
  Administered 2011-06-26: 2 [drp] via OPHTHALMIC
  Filled 2011-06-25: qty 2

## 2011-06-26 ENCOUNTER — Ambulatory Visit (HOSPITAL_COMMUNITY): Payer: BC Managed Care – PPO | Admitting: Certified Registered Nurse Anesthetist

## 2011-06-26 ENCOUNTER — Encounter (HOSPITAL_COMMUNITY)
Admission: RE | Disposition: A | Discharge status: Home / Self Care | Payer: Self-pay | Source: Ambulatory Visit | Attending: Ophthalmology

## 2011-06-26 ENCOUNTER — Encounter (HOSPITAL_COMMUNITY): Payer: Self-pay | Admitting: Certified Registered Nurse Anesthetist

## 2011-06-26 ENCOUNTER — Ambulatory Visit (HOSPITAL_COMMUNITY)
Admission: RE | Admit: 2011-06-26 | Discharge: 2011-06-26 | Disposition: A | Discharge status: Home / Self Care | Payer: BC Managed Care – PPO | Source: Ambulatory Visit | Attending: Ophthalmology | Admitting: Ophthalmology

## 2011-06-26 ENCOUNTER — Encounter (HOSPITAL_COMMUNITY): Payer: Self-pay

## 2011-06-26 ENCOUNTER — Encounter (HOSPITAL_COMMUNITY): Payer: Self-pay | Admitting: *Deleted

## 2011-06-26 DIAGNOSIS — K219 Gastro-esophageal reflux disease without esophagitis: Secondary | ICD-10-CM | POA: Insufficient documentation

## 2011-06-26 DIAGNOSIS — I10 Essential (primary) hypertension: Secondary | ICD-10-CM | POA: Insufficient documentation

## 2011-06-26 DIAGNOSIS — H35349 Macular cyst, hole, or pseudohole, unspecified eye: Secondary | ICD-10-CM | POA: Insufficient documentation

## 2011-06-26 HISTORY — PX: PARS PLANA VITRECTOMY: SHX2166

## 2011-06-26 SURGERY — PARS PLANA VITRECTOMY WITH 23 GAUGE
Anesthesia: General | Site: Eye | Laterality: Left | Wound class: Clean

## 2011-06-26 MED ORDER — FENTANYL CITRATE 0.05 MG/ML IJ SOLN
INTRAMUSCULAR | Status: DC | PRN
  Administered 2011-06-26: 125 ug via INTRAVENOUS

## 2011-06-26 MED ORDER — BSS IO SOLN
INTRAOCULAR | Status: DC | PRN
  Administered 2011-06-26: 15 mL via INTRAOCULAR

## 2011-06-26 MED ORDER — HYPROMELLOSE (GONIOSCOPIC) 2.5 % OP SOLN
OPHTHALMIC | Status: DC | PRN
  Administered 2011-06-26: 2 [drp] via OPHTHALMIC

## 2011-06-26 MED ORDER — DEXAMETHASONE SODIUM PHOSPHATE 4 MG/ML IJ SOLN
INTRAMUSCULAR | Status: DC | PRN
  Administered 2011-06-26: 4 mg via INTRAVENOUS

## 2011-06-26 MED ORDER — LACTATED RINGERS IV SOLN
INTRAVENOUS | Status: DC | PRN
  Administered 2011-06-26: 08:00:00 via INTRAVENOUS

## 2011-06-26 MED ORDER — DEXAMETHASONE SODIUM PHOSPHATE 10 MG/ML IJ SOLN
INTRAMUSCULAR | Status: DC | PRN
  Administered 2011-06-26: 10 mg via INTRAVENOUS

## 2011-06-26 MED ORDER — EPHEDRINE SULFATE 50 MG/ML IJ SOLN
INTRAMUSCULAR | Status: DC | PRN
  Administered 2011-06-26: 5 mg via INTRAVENOUS

## 2011-06-26 MED ORDER — PROPOFOL 10 MG/ML IV EMUL
INTRAVENOUS | Status: DC | PRN
  Administered 2011-06-26: 140 mg via INTRAVENOUS

## 2011-06-26 MED ORDER — SODIUM CHLORIDE 0.9 % IR SOLN
Status: DC | PRN
  Administered 2011-06-26: 1000 mL

## 2011-06-26 MED ORDER — CEFAZOLIN SUBCONJUNCTIVAL INJECTION 100 MG/0.5 ML
200.0000 mg | INJECTION | Freq: Once | SUBCONJUNCTIVAL | Status: AC
  Administered 2011-06-26: 200 mg via SUBCONJUNCTIVAL
  Filled 2011-06-26: qty 1

## 2011-06-26 MED ORDER — ONDANSETRON HCL 4 MG/2ML IJ SOLN
INTRAMUSCULAR | Status: DC | PRN
  Administered 2011-06-26: 4 mg via INTRAVENOUS

## 2011-06-26 MED ORDER — TRIAMCINOLONE ACETONIDE 40 MG/ML IJ SUSP
INTRAMUSCULAR | Status: DC | PRN
  Administered 2011-06-26: 40 mg via INTRAMUSCULAR

## 2011-06-26 MED ORDER — FENTANYL CITRATE 0.05 MG/ML IJ SOLN: 25.0000 ug | INTRAMUSCULAR | Status: DC | PRN

## 2011-06-26 MED ORDER — EPINEPHRINE HCL 1 MG/ML IJ SOLN
INTRAOCULAR | Status: DC | PRN
  Administered 2011-06-26: 07:00:00

## 2011-06-26 MED ORDER — LIDOCAINE HCL (CARDIAC) 20 MG/ML IV SOLN
INTRAVENOUS | Status: DC | PRN
  Administered 2011-06-26: 50 mg via INTRAVENOUS

## 2011-06-26 MED ORDER — BUPIVACAINE HCL 0.75 % IJ SOLN
INTRAMUSCULAR | Status: DC | PRN
  Administered 2011-06-26: 10 mL

## 2011-06-26 MED ORDER — BACITRACIN-POLYMYXIN B 500-10000 UNIT/GM OP OINT
TOPICAL_OINTMENT | OPHTHALMIC | Status: DC | PRN
  Administered 2011-06-26: 1 via OPHTHALMIC

## 2011-06-26 MED ORDER — MIDAZOLAM HCL 5 MG/5ML IJ SOLN
INTRAMUSCULAR | Status: DC | PRN
  Administered 2011-06-26: 1 mg via INTRAVENOUS

## 2011-06-26 SURGICAL SUPPLY — 76 items
ACCESSORY FRAGMATOME (MISCELLANEOUS) IMPLANT
APPLICATOR COTTON TIP 6IN STRL (MISCELLANEOUS) ×2 IMPLANT
APPLICATOR DR MATTHEWS STRL (MISCELLANEOUS) IMPLANT
BAG MINI COLL DRAIN (WOUND CARE) ×2 IMPLANT
BLADE EYE MINI 60D BEAVER (BLADE) ×2 IMPLANT
BLADE KERATOME 2.75 (BLADE) IMPLANT
BLADE MVR KNIFE 19G (BLADE) ×2 IMPLANT
BLADE STAB KNIFE 15DEG (BLADE) ×2 IMPLANT
CANNULA DUAL BORE 23G (CANNULA) IMPLANT
CANNULA FLEX TIP 23G (CANNULA) ×2 IMPLANT
CLOTH BEACON ORANGE TIMEOUT ST (SAFETY) ×2 IMPLANT
CORDS BIPOLAR (ELECTRODE) IMPLANT
DRAPE OPHTHALMIC 77X100 STRL (CUSTOM PROCEDURE TRAY) ×2 IMPLANT
DRAPE POUCH INSTRU U-SHP 10X18 (DRAPES) ×2 IMPLANT
DRSG TEGADERM 4X4.75 (GAUZE/BANDAGES/DRESSINGS) ×2 IMPLANT
ERASER HMR WETFIELD 23G BP (MISCELLANEOUS) IMPLANT
FILTER BLUE MILLIPORE (MISCELLANEOUS) IMPLANT
GAS OPHTHALMIC (MISCELLANEOUS) IMPLANT
GLOVE BIOGEL M 7.0 STRL (GLOVE) ×2 IMPLANT
GLOVE SS BIOGEL STRL SZ 6.5 (GLOVE) ×1 IMPLANT
GLOVE SUPERSENSE BIOGEL SZ 6.5 (GLOVE) ×1
GLOVE SURG SS PI 6.5 STRL IVOR (GLOVE) ×4 IMPLANT
GOWN SRG XL XLNG 56XLVL 4 (GOWN DISPOSABLE) ×1 IMPLANT
GOWN STRL NON-REIN LRG LVL3 (GOWN DISPOSABLE) ×4 IMPLANT
GOWN STRL NON-REIN XL XLG LVL4 (GOWN DISPOSABLE) ×1
ILLUMINATOR WIDEFIELD DIFF (MISCELLANEOUS) ×2 IMPLANT
KIT BASIN OR (CUSTOM PROCEDURE TRAY) ×4 IMPLANT
KIT ROOM TURNOVER OR (KITS) ×2 IMPLANT
KNIFE CRESCENT 1.75 EDGEAHEAD (BLADE) IMPLANT
KNIFE GRIESHABER SHARP 2.5MM (MISCELLANEOUS) IMPLANT
MARKER SKIN DUAL TIP RULER LAB (MISCELLANEOUS) IMPLANT
MASK EYE SHIELD (GAUZE/BANDAGES/DRESSINGS) ×2 IMPLANT
NEEDLE 18GX1X1/2 (RX/OR ONLY) (NEEDLE) ×2 IMPLANT
NEEDLE 22X1 1/2 (OR ONLY) (NEEDLE) ×2 IMPLANT
NEEDLE 25GX 5/8IN NON SAFETY (NEEDLE) IMPLANT
NEEDLE FILTER BLUNT 18X 1/2SAF (NEEDLE)
NEEDLE FILTER BLUNT 18X1 1/2 (NEEDLE) IMPLANT
NEEDLE HYPO 30X.5 LL (NEEDLE) ×4 IMPLANT
NS IRRIG 1000ML POUR BTL (IV SOLUTION) ×2 IMPLANT
OIL SILICONE OPHTHALMIC ADAPTO (Ophthalmic Related) ×2 IMPLANT
PACK COMBINED CATERACT/VIT 23G (OPHTHALMIC RELATED) IMPLANT
PACK VITRECTOMY CUSTOM (CUSTOM PROCEDURE TRAY) ×2 IMPLANT
PACK VITRECTOMY PIC MCHSVP (PACKS) IMPLANT
PAD ARMBOARD 7.5X6 YLW CONV (MISCELLANEOUS) ×2 IMPLANT
PAD EYE OVAL STERILE LF (GAUZE/BANDAGES/DRESSINGS) ×2 IMPLANT
PAK VITRECTOMY PIK  23GA (OPHTHALMIC RELATED) ×2 IMPLANT
PENCIL BIPOLAR 25GA STR DISP (OPHTHALMIC RELATED) IMPLANT
PHACO TIP KELMAN 45DEG (TIP) IMPLANT
PROBE DIRECTIONAL LASER (MISCELLANEOUS) IMPLANT
ROLLS DENTAL (MISCELLANEOUS) IMPLANT
SCRAPER DIAMOND DUST MEMBRANE (MISCELLANEOUS) ×2 IMPLANT
SET FLUID INJECTOR (SET/KITS/TRAYS/PACK) ×2 IMPLANT
SET VGFI TUBING 8065808002 (SET/KITS/TRAYS/PACK) IMPLANT
SHUTTLE MONARCH TYPE A (NEEDLE) IMPLANT
SOLUTION ANTI FOG 6CC (MISCELLANEOUS) ×2 IMPLANT
SPEAR EYE SURG WECK-CEL (MISCELLANEOUS) ×6 IMPLANT
SUT ETHILON 10 0 CS140 6 (SUTURE) IMPLANT
SUT ETHILON 4 0 P 3 18 (SUTURE) IMPLANT
SUT ETHILON 5 0 P 3 18 (SUTURE)
SUT ETHILON 9 0 TG140 8 (SUTURE) IMPLANT
SUT NYLON 10.0 BLK (SUTURE) IMPLANT
SUT NYLON ETHILON 5-0 P-3 1X18 (SUTURE) IMPLANT
SUT PLAIN 6 0 TG1408 (SUTURE) IMPLANT
SUT POLY NON ABSORB 10-0 8 STR (SUTURE) IMPLANT
SUT VICRYL 7 0 TG140 8 (SUTURE) IMPLANT
SUT VICRYL ABS 6-0 S29 18IN (SUTURE) IMPLANT
SYR 20CC LL (SYRINGE) ×2 IMPLANT
SYR 5ML LL (SYRINGE) IMPLANT
SYR TB 1ML LUER SLIP (SYRINGE) ×2 IMPLANT
SYRINGE 10CC LL (SYRINGE) IMPLANT
TAPE SURG TRANSPORE 1 IN (GAUZE/BANDAGES/DRESSINGS) ×1 IMPLANT
TAPE SURGICAL TRANSPORE 1 IN (GAUZE/BANDAGES/DRESSINGS) ×1
TOWEL OR 17X24 6PK STRL BLUE (TOWEL DISPOSABLE) ×6 IMPLANT
TUBE EXTENSION HAMMER (TUBING) IMPLANT
WATER STERILE IRR 1000ML POUR (IV SOLUTION) ×2 IMPLANT
WIPE INSTRUMENT VISIWIPE 73X73 (MISCELLANEOUS) ×2 IMPLANT

## 2011-06-26 NOTE — Op Note (Signed)
[name] 06/26/2011 Macular Hole   Procedure: Pars Plana Vitrectomy, Membrane Peeling, Fluid Gas Exchange and Silicone Oil Operative Eye:  left eye  Surgeon: Shade Flood Estimated Blood Loss: minimal Specimens for Pathology:  None Complications: none   The  patient was prepped and draped in the usual fashion for ocular surgery on the  left eye .  A solid lid speculum was placed. The conjunctiva was displaced with a cotton tipped applicator at the  5:00  meridian. A trocar/cannula was placed 3.5 mm from the surgical limbus. The cannula was visualized in the vitreous cavity. The infusion line was allowed to run and then clamped when placed at the cannula opening. The line was inserted and secured to the drape with an adhesive strip. Trocar/Cannulas were then placed at the 9:30 and 2:30 meridian. The light pipe and vitreous cutter were inserted into the vitreous cavity and the wide field lens was placed. Care was  taken to remove any residual vitreous up to the vitreous base for 360 degrees. The Machemer magnifying lens was placed and Triamcinolone was placed on the surface of the macula with the infusion clamped. The flex cannula was used to remove excess particlalte from the retina surface and the macular hole itself. The diamond dusted silicone brush was used to remove fine fibrinous membrane form the margins of the macular hole. Once this maneuver was completed, total air-fluid exchange was carried out. A Med One 23g VFI cannula was then used to place 5000CS  Silicone Oil in the posterior chamber. As the air fill was approaching completion, the infusion cannula was clamped and a 26 g needle was used for evacuating any residual air from behind the PC IOL. An inferior iridectomy was performed with the vitreous cutter to prevent IOP elevation with silicone oil.  A 7-0 vicryl suture was placed through the sclera abutting the cannulas which were then removed completing the surue pass through the  sclera. The suture was used to close all three cannula sites to prevent sub-conjunctival extrusion of Silicone Oil.  Subconjunctival injection of Ancef 100mg /0.7ml was placed in the infero-medial quadrant to avoid proximity to the cannula site.  A Posterior subtenons injection of Kenalog was placed (40mg ), to manage cystoid edema present. The ocular pressure less than 20 by palpation.  The speculum and drapes were removed and the eye was patched with Polymixin/Bacitracin ophthalmic ointment. An eye shield was placed and the patient was with stable vital signs after uneventful extubation, to the post operative recovery area.  Shade Flood MD

## 2011-06-26 NOTE — Interval H&P Note (Signed)
History and Physical Interval Note:  06/26/2011 7:29 AM  [name]  has presented today for surgery, with the diagnosis of macular hole left eye   The various methods of treatment have been discussed with the patient and family. After consideration of risks, benefits and other options for treatment, the patient has consented to  Procedure(s) (LRB): PARS PLANA VITRECTOMY WITH 23 GAUGE (Left) as a surgical intervention .  The patients' history has been reviewed, patient examined, no change in status, stable for surgery.  I have reviewed the patients' chart and labs.  Questions were answered to the patient's satisfaction.     Shade Flood, MD

## 2011-06-26 NOTE — Preoperative (Signed)
Beta Blockers   Reason not to administer Beta Blockers:Not Applicable 

## 2011-06-26 NOTE — Anesthesia Postprocedure Evaluation (Signed)
  Anesthesia Post-op Note  Patient: [name]  Procedure(s) Performed: Procedure(s) (LRB): PARS PLANA VITRECTOMY WITH 23 GAUGE (Left)  Patient Location: PACU  Anesthesia Type: General  Level of Consciousness: awake, alert  and oriented  Airway and Oxygen Therapy: Patient Spontanous Breathing  Post-op Pain: none  Post-op Assessment: Post-op Vital signs reviewed, Patient's Cardiovascular Status Stable, Respiratory Function Stable, Patent Airway, No signs of Nausea or vomiting and Pain level controlled  Post-op Vital Signs: Reviewed and stable  Complications: No apparent anesthesia complications

## 2011-06-26 NOTE — Anesthesia Preprocedure Evaluation (Addendum)
Anesthesia Evaluation  Patient identified by MRN, date of birth, ID band Patient awake    Reviewed: Allergy & Precautions, H&P , NPO status , Patient's Chart, lab work & pertinent test results, reviewed documented beta blocker date and time   History of Anesthesia Complications Negative for: history of anesthetic complications  Airway Mallampati: I TM Distance: >3 FB Neck ROM: Full    Dental  (+) Teeth Intact, Dental Advisory Given and Missing,    Pulmonary shortness of breath and with exertion,  breath sounds clear to auscultation  Pulmonary exam normal       Cardiovascular hypertension, Pt. on medications + CAD and + Cardiac Stents + Valvular Problems/Murmurs MR Rhythm:Regular Rate:Normal  Echo 03/09/11 EF 55-60% moderate MR, mild-mod tricuspid regurg. 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    Anesthesia Other Findings   Reproductive/Obstetrics negative OB ROS                        Anesthesia Physical Anesthesia Plan  ASA: III  Anesthesia Plan: General   Post-op Pain Management:    Induction: Intravenous  Airway Management Planned: Oral ETT  Additional Equipment:   Intra-op Plan:   Post-operative Plan: Extubation in OR  Informed Consent: I have reviewed the patients History and Physical, chart, labs and discussed the procedure including the risks, benefits and alternatives for the proposed anesthesia with the patient or authorized representative who has indicated  his/her understanding and acceptance.   Dental advisory given  Plan Discussed with: CRNA  Anesthesia Plan Comments: (Plan routine monitors, GETA)        Anesthesia Quick Evaluation

## 2011-06-26 NOTE — Transfer of Care (Signed)
Immediate Anesthesia Transfer of Care Note  Patient: [name]  Procedure(s) Performed: Procedure(s) (LRB): PARS PLANA VITRECTOMY WITH 23 GAUGE (Left)  Patient Location: PACU  Anesthesia Type: General  Level of Consciousness: awake, alert  and patient cooperative  Airway & Oxygen Therapy: Patient Spontanous Breathing and Patient connected to nasal cannula oxygen  Post-op Assessment: Report given to PACU RN, Post -op Vital signs reviewed and stable and Patient moving all extremities  Post vital signs: Reviewed and stable  Complications: No apparent anesthesia complications

## 2011-06-27 ENCOUNTER — Encounter (HOSPITAL_COMMUNITY): Payer: Self-pay | Admitting: Ophthalmology

## 2011-06-28 ENCOUNTER — Encounter (HOSPITAL_COMMUNITY): Payer: Self-pay

## 2011-06-30 ENCOUNTER — Encounter (HOSPITAL_COMMUNITY): Payer: Self-pay

## 2011-07-03 ENCOUNTER — Encounter (HOSPITAL_COMMUNITY): Payer: Self-pay

## 2011-07-05 ENCOUNTER — Encounter (HOSPITAL_COMMUNITY): Payer: Self-pay

## 2011-07-07 ENCOUNTER — Encounter (HOSPITAL_COMMUNITY): Payer: Self-pay

## 2011-07-10 ENCOUNTER — Encounter (HOSPITAL_COMMUNITY): Payer: Self-pay

## 2011-07-12 ENCOUNTER — Encounter (HOSPITAL_COMMUNITY): Payer: Self-pay

## 2011-07-14 ENCOUNTER — Encounter (HOSPITAL_COMMUNITY): Payer: Self-pay

## 2011-07-19 ENCOUNTER — Encounter (HOSPITAL_COMMUNITY): Payer: Self-pay

## 2011-07-21 ENCOUNTER — Encounter (HOSPITAL_COMMUNITY): Payer: Self-pay

## 2011-07-24 ENCOUNTER — Encounter (HOSPITAL_COMMUNITY): Payer: Self-pay | Attending: Internal Medicine

## 2011-07-24 DIAGNOSIS — I10 Essential (primary) hypertension: Secondary | ICD-10-CM | POA: Insufficient documentation

## 2011-07-24 DIAGNOSIS — Z9861 Coronary angioplasty status: Secondary | ICD-10-CM | POA: Insufficient documentation

## 2011-07-24 DIAGNOSIS — I2510 Atherosclerotic heart disease of native coronary artery without angina pectoris: Secondary | ICD-10-CM | POA: Insufficient documentation

## 2011-07-24 DIAGNOSIS — Z5189 Encounter for other specified aftercare: Secondary | ICD-10-CM | POA: Insufficient documentation

## 2011-07-24 DIAGNOSIS — I209 Angina pectoris, unspecified: Secondary | ICD-10-CM | POA: Insufficient documentation

## 2011-07-24 DIAGNOSIS — E785 Hyperlipidemia, unspecified: Secondary | ICD-10-CM | POA: Insufficient documentation

## 2011-07-26 ENCOUNTER — Encounter (HOSPITAL_COMMUNITY): Payer: Self-pay

## 2011-07-28 ENCOUNTER — Encounter (HOSPITAL_COMMUNITY): Payer: Self-pay

## 2011-07-31 ENCOUNTER — Encounter (HOSPITAL_COMMUNITY): Payer: Self-pay

## 2011-08-02 ENCOUNTER — Encounter (HOSPITAL_COMMUNITY): Payer: Self-pay

## 2011-08-04 ENCOUNTER — Encounter (HOSPITAL_COMMUNITY): Payer: Self-pay

## 2011-08-07 ENCOUNTER — Encounter (HOSPITAL_COMMUNITY): Payer: Self-pay

## 2011-08-09 ENCOUNTER — Encounter (HOSPITAL_COMMUNITY): Payer: Self-pay

## 2011-08-11 ENCOUNTER — Encounter (HOSPITAL_COMMUNITY): Payer: Self-pay

## 2011-08-14 ENCOUNTER — Encounter (HOSPITAL_COMMUNITY): Payer: Self-pay

## 2011-08-16 ENCOUNTER — Encounter (HOSPITAL_COMMUNITY): Payer: Self-pay

## 2011-08-18 ENCOUNTER — Encounter (HOSPITAL_COMMUNITY): Payer: Self-pay

## 2011-08-21 ENCOUNTER — Encounter (HOSPITAL_COMMUNITY): Payer: BC Managed Care – PPO | Attending: Internal Medicine

## 2011-08-21 DIAGNOSIS — I209 Angina pectoris, unspecified: Secondary | ICD-10-CM | POA: Insufficient documentation

## 2011-08-21 DIAGNOSIS — I2510 Atherosclerotic heart disease of native coronary artery without angina pectoris: Secondary | ICD-10-CM | POA: Insufficient documentation

## 2011-08-21 DIAGNOSIS — I10 Essential (primary) hypertension: Secondary | ICD-10-CM | POA: Insufficient documentation

## 2011-08-21 DIAGNOSIS — Z5189 Encounter for other specified aftercare: Secondary | ICD-10-CM | POA: Insufficient documentation

## 2011-08-21 DIAGNOSIS — Z9861 Coronary angioplasty status: Secondary | ICD-10-CM | POA: Insufficient documentation

## 2011-08-21 DIAGNOSIS — E785 Hyperlipidemia, unspecified: Secondary | ICD-10-CM | POA: Insufficient documentation

## 2011-08-23 ENCOUNTER — Encounter (HOSPITAL_COMMUNITY): Payer: BC Managed Care – PPO

## 2011-08-25 ENCOUNTER — Encounter (HOSPITAL_COMMUNITY): Payer: BC Managed Care – PPO

## 2011-08-28 ENCOUNTER — Encounter (HOSPITAL_COMMUNITY): Payer: BC Managed Care – PPO

## 2011-08-30 ENCOUNTER — Encounter (HOSPITAL_COMMUNITY): Payer: BC Managed Care – PPO

## 2011-09-01 ENCOUNTER — Encounter (HOSPITAL_COMMUNITY): Payer: BC Managed Care – PPO

## 2011-09-04 ENCOUNTER — Encounter (HOSPITAL_COMMUNITY): Payer: BC Managed Care – PPO

## 2011-09-06 ENCOUNTER — Encounter (HOSPITAL_COMMUNITY): Payer: BC Managed Care – PPO

## 2011-09-08 ENCOUNTER — Encounter (HOSPITAL_COMMUNITY): Payer: BC Managed Care – PPO

## 2011-09-11 ENCOUNTER — Encounter (HOSPITAL_COMMUNITY): Payer: BC Managed Care – PPO

## 2011-09-13 ENCOUNTER — Encounter (HOSPITAL_COMMUNITY): Payer: BC Managed Care – PPO

## 2011-09-15 ENCOUNTER — Encounter (HOSPITAL_COMMUNITY): Payer: BC Managed Care – PPO

## 2011-09-18 ENCOUNTER — Encounter (HOSPITAL_COMMUNITY): Payer: BC Managed Care – PPO

## 2011-09-20 ENCOUNTER — Encounter (HOSPITAL_COMMUNITY): Payer: BC Managed Care – PPO

## 2011-09-22 ENCOUNTER — Encounter (HOSPITAL_COMMUNITY): Payer: BC Managed Care – PPO

## 2011-09-25 ENCOUNTER — Encounter (HOSPITAL_COMMUNITY): Payer: BC Managed Care – PPO

## 2011-09-27 ENCOUNTER — Encounter (HOSPITAL_COMMUNITY): Payer: BC Managed Care – PPO

## 2011-09-29 ENCOUNTER — Encounter (HOSPITAL_COMMUNITY): Payer: BC Managed Care – PPO

## 2011-10-02 ENCOUNTER — Encounter (HOSPITAL_COMMUNITY): Payer: BC Managed Care – PPO

## 2011-10-04 ENCOUNTER — Encounter (HOSPITAL_COMMUNITY): Payer: BC Managed Care – PPO

## 2011-10-06 ENCOUNTER — Encounter (HOSPITAL_COMMUNITY): Payer: BC Managed Care – PPO

## 2011-10-09 ENCOUNTER — Encounter (HOSPITAL_COMMUNITY): Payer: BC Managed Care – PPO

## 2011-10-11 ENCOUNTER — Encounter (HOSPITAL_COMMUNITY): Payer: BC Managed Care – PPO

## 2011-10-12 ENCOUNTER — Ambulatory Visit (HOSPITAL_COMMUNITY)
Admission: RE | Admit: 2011-10-12 | Discharge: 2011-10-12 | Disposition: A | Discharge status: Home / Self Care | Payer: BC Managed Care – PPO | Source: Ambulatory Visit | Attending: Internal Medicine | Admitting: Internal Medicine

## 2011-10-12 ENCOUNTER — Encounter (HOSPITAL_COMMUNITY): Payer: Self-pay

## 2011-10-12 VITALS — BP 120/74 | HR 64 | Ht 68.0 in | Wt 217.0 lb

## 2011-10-12 DIAGNOSIS — I10 Essential (primary) hypertension: Secondary | ICD-10-CM | POA: Insufficient documentation

## 2011-10-12 DIAGNOSIS — I2510 Atherosclerotic heart disease of native coronary artery without angina pectoris: Secondary | ICD-10-CM | POA: Insufficient documentation

## 2011-10-12 NOTE — Assessment & Plan Note (Addendum)
No evidence of ischemia. Continue current regimen. Follow up in 6 months.   Patient seen and examined with Tonye Becket, NP. We discussed all aspects of the encounter. I agree with the assessment and plan as stated above. She is doing great. No need for further testing at this point. Cut ASA to 81 daily,

## 2011-10-12 NOTE — Progress Notes (Signed)
Patient ID: [name], female   DOB: 05/08/1945, 66 y.o.   MRN: [id number]   PCP: Juline Patch HPI:  [name] is a delightful 66 year old woman with a history of hypertension, hyperlipidemia, fibromuscular dysplasia of the intracranial vessels, and previous left internal carotid artery dissection. She also has a history of coronary artery disease. She is status post previous stenting to the LAD and diagonal. Most recently, she had a Promus drug-eluting stent to the second diagonal in December 2008. She has followed Dr. Aleen Campi, but since his retirement has not reestablished with another cardiologist.   Was in cardiac rehab in December 2011 and experienced CP. Took NTG and then had a presyncopal episode with SBP 80. With this developed transiet L facial dropp thought to be due to cerebral hypoperfusion. CT/MRI normal. R/o for MI with serial cardiac markers. Lexiscan Myoview 12/11 showed EF 72% with normal perfusion.   Unable to tolerate b-blockers due to bradycardia.   Left eye surgery due to macular hole.  05/2011 06/2011 Plan for removal silicone in L eye 05/2011.   She returns for follow up. Exertional dyspnea going up stairs.  Denies CP She has not been to cardiac rehab since May, but plans return after eye surgery in September. Exposed to mold in her office but that has since been repaired.     ROS: All systems negative except as listed in HPI, PMH and Problem List.  Past Medical History  Diagnosis Date  . Internal hemorrhoids without mention of complication   . Neutropenia, unspecified   . Coronary artery disease   . Hyperlipidemia     takes pravachol  . Chest pain   . Hypertension   . Heart murmur   . GERD (gastroesophageal reflux disease)   . Shortness of breath     with exertion  . Headache     hx of  . Neuromuscular disorder 2010    Fibromuscular dysplasia    Current Outpatient Prescriptions  Medication Sig Dispense Refill  . aspirin 81 MG tablet Take 81 mg by  mouth daily.        . clopidogrel (PLAVIX) 75 MG tablet Take 75 mg by mouth daily.        . nitroGLYCERIN (NITROSTAT) 0.4 MG SL tablet Place 0.4 mg under the tongue every 5 (five) minutes as needed. For chest pain      . pravastatin (PRAVACHOL) 20 MG tablet Take 40 mg by mouth daily.       . valsartan-hydrochlorothiazide (DIOVAN-HCT) 80-12.5 MG per tablet Take 1 tablet by mouth Daily.      Marland Kitchen DISCONTD: Valsartan (DIOVAN PO) Take by mouth.          PHYSICAL EXAM: Filed Vitals:   10/12/11 0853  BP: 120/74  Pulse: 64   General:  Well appearing. No resp difficulty HEENT: normal Neck: supple. JVP flat. Carotids 2+ bilaterally; faint radiated bruits. No lymphadenopathy or thryomegaly appreciated. Cor: PMI normal. Regular rate & rhythm. No rubs, gallops 2/6 AS murmur. Lungs: clear Abdomen: soft, nontender, nondistended. No hepatosplenomegaly. No bruits or masses. Good bowel sounds. Extremities: no cyanosis, clubbing, rash, edema Neuro: alert & orientedx3, cranial nerves grossly intact. Moves all 4 extremities w/o difficulty. Affect pleasant.        ASSESSMENT & PLAN

## 2011-10-12 NOTE — Patient Instructions (Addendum)
Follow up in 6 months 

## 2011-10-13 ENCOUNTER — Encounter (HOSPITAL_COMMUNITY): Payer: BC Managed Care – PPO

## 2011-10-14 DIAGNOSIS — I10 Essential (primary) hypertension: Secondary | ICD-10-CM | POA: Insufficient documentation

## 2011-10-14 NOTE — Assessment & Plan Note (Signed)
Blood pressure well controlled. Continue current regimen.  

## 2011-10-16 ENCOUNTER — Encounter (HOSPITAL_COMMUNITY): Payer: BC Managed Care – PPO

## 2011-10-18 ENCOUNTER — Encounter (HOSPITAL_COMMUNITY): Payer: BC Managed Care – PPO

## 2011-10-20 ENCOUNTER — Encounter (HOSPITAL_COMMUNITY): Payer: BC Managed Care – PPO

## 2011-10-25 ENCOUNTER — Encounter (HOSPITAL_COMMUNITY): Payer: BC Managed Care – PPO

## 2011-10-27 ENCOUNTER — Encounter (HOSPITAL_COMMUNITY): Payer: BC Managed Care – PPO

## 2011-10-30 ENCOUNTER — Encounter (HOSPITAL_COMMUNITY): Payer: BC Managed Care – PPO

## 2011-11-01 ENCOUNTER — Encounter (HOSPITAL_COMMUNITY): Payer: BC Managed Care – PPO

## 2011-11-03 ENCOUNTER — Encounter (HOSPITAL_COMMUNITY): Payer: BC Managed Care – PPO

## 2011-11-06 ENCOUNTER — Encounter (HOSPITAL_COMMUNITY): Payer: BC Managed Care – PPO

## 2011-11-08 ENCOUNTER — Encounter (HOSPITAL_COMMUNITY): Payer: BC Managed Care – PPO

## 2011-11-10 ENCOUNTER — Encounter (HOSPITAL_COMMUNITY): Payer: BC Managed Care – PPO

## 2011-11-13 ENCOUNTER — Encounter (HOSPITAL_COMMUNITY): Payer: BC Managed Care – PPO

## 2011-11-15 ENCOUNTER — Encounter (HOSPITAL_COMMUNITY): Payer: BC Managed Care – PPO

## 2011-11-17 ENCOUNTER — Encounter (HOSPITAL_COMMUNITY): Payer: BC Managed Care – PPO

## 2011-11-20 ENCOUNTER — Encounter (HOSPITAL_COMMUNITY): Payer: BC Managed Care – PPO

## 2011-11-22 ENCOUNTER — Encounter (HOSPITAL_COMMUNITY): Payer: BC Managed Care – PPO

## 2011-11-24 ENCOUNTER — Encounter (HOSPITAL_COMMUNITY): Payer: BC Managed Care – PPO

## 2011-11-27 ENCOUNTER — Encounter (HOSPITAL_COMMUNITY): Payer: BC Managed Care – PPO

## 2011-11-29 ENCOUNTER — Encounter (HOSPITAL_COMMUNITY): Payer: BC Managed Care – PPO

## 2011-12-01 ENCOUNTER — Encounter (HOSPITAL_COMMUNITY): Payer: BC Managed Care – PPO

## 2011-12-04 ENCOUNTER — Encounter (HOSPITAL_COMMUNITY): Payer: BC Managed Care – PPO

## 2011-12-06 ENCOUNTER — Encounter (HOSPITAL_COMMUNITY): Payer: BC Managed Care – PPO

## 2011-12-08 ENCOUNTER — Encounter (HOSPITAL_COMMUNITY): Payer: BC Managed Care – PPO

## 2011-12-11 ENCOUNTER — Encounter (HOSPITAL_COMMUNITY): Payer: BC Managed Care – PPO

## 2011-12-13 ENCOUNTER — Encounter (HOSPITAL_COMMUNITY): Payer: BC Managed Care – PPO

## 2011-12-15 ENCOUNTER — Encounter (HOSPITAL_COMMUNITY): Payer: BC Managed Care – PPO

## 2011-12-18 ENCOUNTER — Encounter (HOSPITAL_COMMUNITY): Payer: BC Managed Care – PPO

## 2011-12-20 ENCOUNTER — Encounter (HOSPITAL_COMMUNITY): Payer: BC Managed Care – PPO

## 2011-12-22 ENCOUNTER — Encounter (HOSPITAL_COMMUNITY): Payer: BC Managed Care – PPO

## 2011-12-25 ENCOUNTER — Encounter (HOSPITAL_COMMUNITY): Payer: BC Managed Care – PPO

## 2011-12-27 ENCOUNTER — Encounter (HOSPITAL_COMMUNITY): Payer: BC Managed Care – PPO

## 2011-12-29 ENCOUNTER — Encounter (HOSPITAL_COMMUNITY): Payer: BC Managed Care – PPO

## 2012-01-01 ENCOUNTER — Encounter (HOSPITAL_COMMUNITY): Payer: BC Managed Care – PPO

## 2012-01-03 ENCOUNTER — Encounter (HOSPITAL_COMMUNITY): Payer: BC Managed Care – PPO

## 2012-01-05 ENCOUNTER — Encounter (HOSPITAL_COMMUNITY): Payer: BC Managed Care – PPO

## 2012-01-08 ENCOUNTER — Encounter (HOSPITAL_COMMUNITY): Payer: BC Managed Care – PPO

## 2012-01-10 ENCOUNTER — Encounter (HOSPITAL_COMMUNITY): Payer: BC Managed Care – PPO

## 2012-01-12 ENCOUNTER — Encounter (HOSPITAL_COMMUNITY): Payer: BC Managed Care – PPO

## 2012-01-15 ENCOUNTER — Encounter (HOSPITAL_COMMUNITY): Payer: BC Managed Care – PPO

## 2012-01-17 ENCOUNTER — Encounter (HOSPITAL_COMMUNITY): Payer: BC Managed Care – PPO

## 2012-01-22 ENCOUNTER — Encounter (HOSPITAL_COMMUNITY): Payer: BC Managed Care – PPO

## 2012-01-24 ENCOUNTER — Encounter (HOSPITAL_COMMUNITY): Payer: BC Managed Care – PPO

## 2012-01-26 ENCOUNTER — Encounter (HOSPITAL_COMMUNITY): Payer: BC Managed Care – PPO

## 2012-01-29 ENCOUNTER — Encounter (HOSPITAL_COMMUNITY): Payer: BC Managed Care – PPO

## 2012-01-31 ENCOUNTER — Encounter (HOSPITAL_COMMUNITY): Payer: BC Managed Care – PPO

## 2012-02-02 ENCOUNTER — Encounter (HOSPITAL_COMMUNITY): Payer: BC Managed Care – PPO

## 2012-02-05 ENCOUNTER — Encounter (HOSPITAL_COMMUNITY): Payer: BC Managed Care – PPO

## 2012-02-07 ENCOUNTER — Encounter (HOSPITAL_COMMUNITY): Payer: BC Managed Care – PPO

## 2012-02-09 ENCOUNTER — Encounter (HOSPITAL_COMMUNITY): Payer: BC Managed Care – PPO

## 2012-02-12 ENCOUNTER — Encounter (HOSPITAL_COMMUNITY): Payer: BC Managed Care – PPO

## 2012-02-16 ENCOUNTER — Encounter (HOSPITAL_COMMUNITY): Payer: BC Managed Care – PPO

## 2012-02-19 ENCOUNTER — Encounter (HOSPITAL_COMMUNITY): Payer: BC Managed Care – PPO

## 2012-02-23 ENCOUNTER — Encounter (HOSPITAL_COMMUNITY): Payer: BC Managed Care – PPO

## 2012-02-26 ENCOUNTER — Encounter (HOSPITAL_COMMUNITY): Payer: BC Managed Care – PPO

## 2012-02-28 ENCOUNTER — Encounter (HOSPITAL_COMMUNITY): Payer: BC Managed Care – PPO

## 2012-03-01 ENCOUNTER — Encounter (HOSPITAL_COMMUNITY): Payer: BC Managed Care – PPO

## 2012-03-04 ENCOUNTER — Encounter (HOSPITAL_COMMUNITY): Payer: BC Managed Care – PPO

## 2012-03-06 ENCOUNTER — Encounter (HOSPITAL_COMMUNITY): Payer: BC Managed Care – PPO

## 2012-03-08 ENCOUNTER — Encounter (HOSPITAL_COMMUNITY): Payer: BC Managed Care – PPO

## 2012-03-11 ENCOUNTER — Encounter (HOSPITAL_COMMUNITY): Payer: BC Managed Care – PPO

## 2012-03-13 ENCOUNTER — Encounter (HOSPITAL_COMMUNITY): Payer: BC Managed Care – PPO

## 2012-03-15 ENCOUNTER — Encounter (HOSPITAL_COMMUNITY): Payer: BC Managed Care – PPO

## 2012-03-18 ENCOUNTER — Encounter (HOSPITAL_COMMUNITY): Payer: BC Managed Care – PPO

## 2012-03-20 ENCOUNTER — Encounter (HOSPITAL_COMMUNITY): Payer: BC Managed Care – PPO

## 2012-03-22 ENCOUNTER — Encounter (HOSPITAL_COMMUNITY): Payer: BC Managed Care – PPO

## 2012-03-25 ENCOUNTER — Encounter (HOSPITAL_COMMUNITY): Payer: BC Managed Care – PPO

## 2012-03-27 ENCOUNTER — Encounter (HOSPITAL_COMMUNITY): Payer: BC Managed Care – PPO

## 2012-03-27 DIAGNOSIS — H3589 Other specified retinal disorders: Secondary | ICD-10-CM | POA: Insufficient documentation

## 2012-03-27 DIAGNOSIS — H35349 Macular cyst, hole, or pseudohole, unspecified eye: Secondary | ICD-10-CM | POA: Insufficient documentation

## 2012-03-28 DIAGNOSIS — H35359 Cystoid macular degeneration, unspecified eye: Secondary | ICD-10-CM | POA: Insufficient documentation

## 2012-03-28 DIAGNOSIS — H3090 Unspecified chorioretinal inflammation, unspecified eye: Secondary | ICD-10-CM | POA: Insufficient documentation

## 2012-03-29 ENCOUNTER — Encounter (HOSPITAL_COMMUNITY): Payer: BC Managed Care – PPO

## 2012-04-01 ENCOUNTER — Encounter (HOSPITAL_COMMUNITY): Payer: BC Managed Care – PPO

## 2012-04-03 ENCOUNTER — Encounter (HOSPITAL_COMMUNITY): Payer: BC Managed Care – PPO

## 2012-04-05 ENCOUNTER — Encounter (HOSPITAL_COMMUNITY): Payer: BC Managed Care – PPO

## 2012-04-08 ENCOUNTER — Encounter (HOSPITAL_COMMUNITY): Payer: BC Managed Care – PPO

## 2012-04-10 ENCOUNTER — Encounter (HOSPITAL_COMMUNITY): Payer: BC Managed Care – PPO

## 2012-04-12 ENCOUNTER — Encounter (HOSPITAL_COMMUNITY): Payer: BC Managed Care – PPO

## 2012-04-15 ENCOUNTER — Encounter (HOSPITAL_COMMUNITY): Payer: BC Managed Care – PPO

## 2012-04-17 ENCOUNTER — Encounter (HOSPITAL_COMMUNITY): Payer: BC Managed Care – PPO

## 2012-04-18 ENCOUNTER — Encounter (HOSPITAL_COMMUNITY): Payer: Self-pay

## 2012-04-18 ENCOUNTER — Ambulatory Visit (HOSPITAL_COMMUNITY)
Admission: RE | Admit: 2012-04-18 | Discharge: 2012-04-18 | Disposition: A | Discharge status: Home / Self Care | Payer: BC Managed Care – PPO | Source: Ambulatory Visit | Attending: Internal Medicine | Admitting: Internal Medicine

## 2012-04-18 VITALS — BP 104/66 | HR 65 | Wt 215.8 lb

## 2012-04-18 DIAGNOSIS — I10 Essential (primary) hypertension: Secondary | ICD-10-CM | POA: Insufficient documentation

## 2012-04-18 DIAGNOSIS — I2510 Atherosclerotic heart disease of native coronary artery without angina pectoris: Secondary | ICD-10-CM | POA: Insufficient documentation

## 2012-04-18 DIAGNOSIS — I7789 Other specified disorders of arteries and arterioles: Secondary | ICD-10-CM | POA: Insufficient documentation

## 2012-04-18 DIAGNOSIS — E782 Mixed hyperlipidemia: Secondary | ICD-10-CM | POA: Insufficient documentation

## 2012-04-18 NOTE — Assessment & Plan Note (Addendum)
Well controlled.  Continue Diovan-HCT  Attending: Agree.

## 2012-04-18 NOTE — Assessment & Plan Note (Addendum)
Dr. Ricki Miller will draw FLP in the next 1-2 weeks.    Attending: Agree. Goal LDL < 70. Continue statin.

## 2012-04-18 NOTE — Progress Notes (Signed)
  PCP: Juline Patch  HPI:  [name] is a delightful 67 year old woman with a history of hypertension, hyperlipidemia, fibromuscular dysplasia of the intracranial vessels, and previous left internal carotid artery dissection. She also has a history of coronary artery disease. She is status post previous stenting to the LAD and diagonal. Most recently, she had a Promus drug-eluting stent to the second diagonal in December 2008. She has followed Dr. Aleen Campi, but since his retirement has not reestablished with another cardiologist.   Was in cardiac rehab in December 2011 and experienced CP. Took NTG and then had a presyncopal episode with SBP 80. With this developed transiet L facial droop thought to be due to cerebral hypoperfusion. CT/MRI normal. R/o for MI with serial cardiac markers. Lexiscan Myoview 12/11 showed EF 72% with normal perfusion.   Unable to tolerate b-blockers due to bradycardia.   Left eye surgery due to macular hole, 06/2011 Plan for removal silicone in L eye 05/2011.   Echo 02/2011: EF 55-60%.  Grade 1 diastolic dysfunction.  Mod MR.  Mildly dilated LA.  Mild-mod TR.  She returns for follow up.  She feels well.  She says she has exertional dyspnea that may have increased a bit over the last year.  No syncope.  +edema in feet.  No chest pain.      ROS: All systems negative except as listed in HPI, PMH and Problem List.  Past Medical History  Diagnosis Date  . Internal hemorrhoids without mention of complication   . Neutropenia, unspecified   . Coronary artery disease   . Hyperlipidemia     takes pravachol  . Chest pain   . Hypertension   . Heart murmur   . GERD (gastroesophageal reflux disease)   . Shortness of breath     with exertion  . Headache     hx of  . Neuromuscular disorder 2010    Fibromuscular dysplasia    Current Outpatient Prescriptions  Medication Sig Dispense Refill  . aspirin 81 MG tablet Take 81 mg by mouth daily.        . clopidogrel  (PLAVIX) 75 MG tablet Take 75 mg by mouth daily.        . nitroGLYCERIN (NITROSTAT) 0.4 MG SL tablet Place 0.4 mg under the tongue every 5 (five) minutes as needed. For chest pain      . pravastatin (PRAVACHOL) 20 MG tablet Take 40 mg by mouth daily.       . valsartan-hydrochlorothiazide (DIOVAN-HCT) 80-12.5 MG per tablet Take 1 tablet by mouth Daily.      . [DISCONTINUED] Valsartan (DIOVAN PO) Take by mouth.        No current facility-administered medications for this encounter.     PHYSICAL EXAM: Filed Vitals:   04/18/12 1559  BP: 104/66  Pulse: 65  Weight: 215 lb 12.8 oz (97.886 kg)  SpO2: 99%    General:  Well appearing. No resp difficulty HEENT: normal Neck: supple. JVP flat. Carotids 2+ bilaterally; faint radiated bruits. No lymphadenopathy or thryomegaly appreciated. Cor: PMI normal. Regular rate & rhythm. No rubs, gallops. 3/6 AS murmur. Lungs: clear Abdomen: soft, nontender, nondistended. No hepatosplenomegaly. No bruits or masses. Good bowel sounds. Extremities: no cyanosis, clubbing, rash, tr lower extremity edema Neuro: alert & orientedx3, cranial nerves grossly intact. Moves all 4 extremities w/o difficulty. Affect pleasant.        ASSESSMENT & PLAN

## 2012-04-18 NOTE — Assessment & Plan Note (Addendum)
No ischemic symptoms, will continue ASA and pravastatin.  No beta blocker 2/2 bradycardia.   Patient seen and examined with Ulyess Blossom, PA-C. We discussed all aspects of the encounter. I agree with the assessment and plan as stated above.  No evidence of ischemia. Continue current regimen.

## 2012-04-19 ENCOUNTER — Encounter (HOSPITAL_COMMUNITY): Payer: BC Managed Care – PPO

## 2012-04-20 DIAGNOSIS — I773 Arterial fibromuscular dysplasia: Secondary | ICD-10-CM | POA: Insufficient documentation

## 2012-04-20 NOTE — Assessment & Plan Note (Signed)
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 I reviewed her scans. No evidence of aneurysmal dilation of significant stenosis. I also discussed with Dr. Excell Seltzer. Given recent scan results and fact that she is asymptomatic will not pursue further imaging at this time. She can f/u with neurology as needed.

## 2012-04-22 ENCOUNTER — Encounter (HOSPITAL_COMMUNITY): Payer: BC Managed Care – PPO

## 2012-04-24 ENCOUNTER — Encounter (HOSPITAL_COMMUNITY): Payer: BC Managed Care – PPO

## 2012-04-26 ENCOUNTER — Encounter (HOSPITAL_COMMUNITY): Payer: BC Managed Care – PPO

## 2012-04-29 ENCOUNTER — Encounter (HOSPITAL_COMMUNITY): Payer: BC Managed Care – PPO

## 2012-05-01 ENCOUNTER — Encounter (HOSPITAL_COMMUNITY): Payer: BC Managed Care – PPO

## 2012-05-03 ENCOUNTER — Encounter (HOSPITAL_COMMUNITY): Payer: BC Managed Care – PPO

## 2012-05-06 ENCOUNTER — Encounter (HOSPITAL_COMMUNITY): Payer: BC Managed Care – PPO

## 2012-05-08 ENCOUNTER — Encounter (HOSPITAL_COMMUNITY): Payer: BC Managed Care – PPO

## 2012-05-10 ENCOUNTER — Encounter (HOSPITAL_COMMUNITY): Payer: BC Managed Care – PPO

## 2012-05-13 ENCOUNTER — Encounter (HOSPITAL_COMMUNITY): Payer: BC Managed Care – PPO

## 2012-05-15 ENCOUNTER — Encounter (HOSPITAL_COMMUNITY): Payer: BC Managed Care – PPO

## 2012-05-17 ENCOUNTER — Encounter (HOSPITAL_COMMUNITY): Payer: BC Managed Care – PPO

## 2012-05-20 ENCOUNTER — Encounter (HOSPITAL_COMMUNITY): Payer: BC Managed Care – PPO

## 2012-05-22 ENCOUNTER — Encounter (HOSPITAL_COMMUNITY): Payer: BC Managed Care – PPO

## 2012-05-24 ENCOUNTER — Encounter (HOSPITAL_COMMUNITY): Payer: BC Managed Care – PPO

## 2012-05-27 ENCOUNTER — Encounter (HOSPITAL_COMMUNITY): Payer: BC Managed Care – PPO

## 2012-05-29 ENCOUNTER — Encounter (HOSPITAL_COMMUNITY): Payer: BC Managed Care – PPO

## 2012-05-31 ENCOUNTER — Encounter (HOSPITAL_COMMUNITY): Payer: BC Managed Care – PPO

## 2012-06-03 ENCOUNTER — Encounter (HOSPITAL_COMMUNITY): Payer: BC Managed Care – PPO

## 2012-06-05 ENCOUNTER — Encounter (HOSPITAL_COMMUNITY): Payer: BC Managed Care – PPO

## 2012-06-07 ENCOUNTER — Encounter (HOSPITAL_COMMUNITY): Payer: BC Managed Care – PPO

## 2012-06-10 ENCOUNTER — Encounter (HOSPITAL_COMMUNITY): Payer: BC Managed Care – PPO

## 2012-06-12 ENCOUNTER — Encounter (HOSPITAL_COMMUNITY): Payer: BC Managed Care – PPO

## 2012-06-14 ENCOUNTER — Encounter (HOSPITAL_COMMUNITY): Payer: BC Managed Care – PPO

## 2012-06-17 ENCOUNTER — Encounter (HOSPITAL_COMMUNITY): Payer: BC Managed Care – PPO

## 2012-06-19 ENCOUNTER — Encounter (HOSPITAL_COMMUNITY): Payer: BC Managed Care – PPO

## 2013-03-17 DIAGNOSIS — H2510 Age-related nuclear cataract, unspecified eye: Secondary | ICD-10-CM | POA: Insufficient documentation

## 2013-03-17 DIAGNOSIS — H11829 Conjunctivochalasis, unspecified eye: Secondary | ICD-10-CM | POA: Insufficient documentation

## 2013-04-24 ENCOUNTER — Ambulatory Visit (HOSPITAL_COMMUNITY)
Admission: RE | Admit: 2013-04-24 | Discharge: 2013-04-24 | Disposition: A | Discharge status: Home / Self Care | Payer: Medicare Other | Source: Ambulatory Visit | Attending: Internal Medicine | Admitting: Internal Medicine

## 2013-04-24 ENCOUNTER — Other Ambulatory Visit (HOSPITAL_COMMUNITY): Payer: Self-pay | Admitting: Cardiology

## 2013-04-24 ENCOUNTER — Ambulatory Visit (HOSPITAL_BASED_OUTPATIENT_CLINIC_OR_DEPARTMENT_OTHER)
Admission: RE | Admit: 2013-04-24 | Discharge: 2013-04-24 | Disposition: A | Discharge status: Home / Self Care | Payer: Medicare Other | Source: Ambulatory Visit | Attending: Internal Medicine | Admitting: Internal Medicine

## 2013-04-24 VITALS — BP 108/58 | HR 56 | Wt 217.5 lb

## 2013-04-24 DIAGNOSIS — I509 Heart failure, unspecified: Secondary | ICD-10-CM | POA: Insufficient documentation

## 2013-04-24 DIAGNOSIS — I2510 Atherosclerotic heart disease of native coronary artery without angina pectoris: Secondary | ICD-10-CM

## 2013-04-24 DIAGNOSIS — I10 Essential (primary) hypertension: Secondary | ICD-10-CM

## 2013-04-24 DIAGNOSIS — I25810 Atherosclerosis of coronary artery bypass graft(s) without angina pectoris: Secondary | ICD-10-CM

## 2013-04-24 DIAGNOSIS — I369 Nonrheumatic tricuspid valve disorder, unspecified: Secondary | ICD-10-CM

## 2013-04-24 NOTE — Patient Instructions (Signed)
Your doing great!  Your physician recommends that you schedule a follow-up appointment in: 12 months with Dr.Katarina Meda Coffee with [facility]

## 2013-04-24 NOTE — Addendum Note (Signed)
Encounter addended by: Kerry Dory, CMA on: 04/24/2013 11:22 AM<BR>     Documentation filed: Patient Instructions Section

## 2013-04-24 NOTE — Progress Notes (Signed)
Patient ID: [name], female   DOB: 03-03-1945, 68 y.o.   MRN: [id number]   PCP: [name] HPI:  [name] is a delightful 68 year old woman with a history of hypertension, hyperlipidemia, fibromuscular dysplasia of the intracranial vessels, and previous left internal carotid artery dissection. She also has a history of coronary artery disease. She is status post previous stenting to the LAD and diagonal. Most recently, she had a Promus drug-eluting stent to the second diagonal in December 2008.   Was in cardiac rehab in December 2011 and experienced CP. Took NTG and then had a presyncopal episode with SBP 80. With this developed transient L facial dropp thought to be due to cerebral hypoperfusion. CT/MRI normal. R/o for MI with serial cardiac markers. Lexiscan Myoview 12/11 showed EF 72% with normal perfusion.   Unable to tolerate b-blockers due to bradycardia.   She returns for f/u. We have not seen her in69 almost 2 years. Doing very well. No significant CP. Not overly active. Gets mild dyspnea with going up stairs. No edema. BP well controlled. Dr. Minna Hart following cholesterol. Has mild oral bleeding almost every day but not sure where it is coming from.   Echo today EF 60-65% Grade 2 DD. RV normal. Mild AI   ROS: All systems negative except as listed in HPI, PMH and Problem List.  Past Medical History  Diagnosis Date  . Internal hemorrhoids without mention of complication   . Neutropenia, unspecified   . Coronary artery disease   . Hyperlipidemia     takes pravachol  . Chest pain   . Hypertension   . Heart murmur   . GERD (gastroesophageal reflux disease)   . Shortness of breath     with exertion  . Headache     hx of  . Neuromuscular disorder 2010    Fibromuscular dysplasia    Current Outpatient Prescriptions  Medication Sig Dispense Refill  . aspirin 81 MG tablet Take 81 mg by mouth daily.        . clopidogrel (PLAVIX) 75 MG tablet Take 75 mg by mouth daily.         . nitroGLYCERIN (NITROSTAT) 0.4 MG SL tablet Place 0.4 mg under the tongue every 5 (five) minutes as needed. For chest pain      . pravastatin (PRAVACHOL) 20 MG tablet Take 40 mg by mouth daily.       . valsartan-hydrochlorothiazide (DIOVAN-HCT) 80-12.5 MG per tablet Take 1 tablet by mouth Daily.      . [DISCONTINUED] Valsartan (DIOVAN PO) Take by mouth.        No current facility-administered medications for this encounter.     PHYSICAL EXAM: Filed Vitals:   04/24/13 1023  BP: 108/58  Pulse: 56   General:  Well appearing. No resp difficulty HEENT: normal Neck: supple. JVP flat. Carotids 2+ bilaterally; faint radiated bruits. No lymphadenopathy or thryomegaly appreciated. Cor: PMI normal. Regular rate & rhythm. No rubs, gallops 2/6 AS murmur. Lungs: clear Abdomen: soft, nontender, nondistended. No hepatosplenomegaly. No bruits or masses. Good bowel sounds. Extremities: no cyanosis, clubbing, rash, edema Neuro: alert & orientedx3, cranial nerves grossly intact. Moves all 4 extremities w/o difficulty. Affect pleasant.  ASSESSMENT & PLAN  1. CAD - No evidence of ischemia. Continue current regimen. Given first generation DES in LAD would continue Plavix indefinitely unless oral bleeding gets worse. Continue ASA 81. Dr. Minna Hart following lipids 2. HTN - Blood pressure well controlled. Continue current regimen.  Will refer to Leslie  Hart to establish ongoing CAD Hart on yearly basis.   Leslie Casas,MD 11:01 AM

## 2013-04-24 NOTE — Progress Notes (Signed)
  Echocardiogram 2D Echocardiogram has been performed.  [place], [place] 04/24/2013, 9:22 AM

## 2013-05-02 ENCOUNTER — Encounter: Payer: Self-pay | Admitting: Internal Medicine

## 2013-06-16 ENCOUNTER — Encounter: Payer: Self-pay | Admitting: *Deleted

## 2013-06-19 ENCOUNTER — Ambulatory Visit (INDEPENDENT_AMBULATORY_CARE_PROVIDER_SITE_OTHER): Payer: Medicare Other | Admitting: Gastroenterology

## 2013-06-19 ENCOUNTER — Telehealth: Payer: Self-pay | Admitting: *Deleted

## 2013-06-19 ENCOUNTER — Encounter: Payer: Self-pay | Admitting: Gastroenterology

## 2013-06-19 VITALS — BP 100/60 | HR 52 | Ht 68.0 in | Wt 213.0 lb

## 2013-06-19 DIAGNOSIS — Z800 Family history of malignant neoplasm of digestive organs: Secondary | ICD-10-CM

## 2013-06-19 DIAGNOSIS — I2510 Atherosclerotic heart disease of native coronary artery without angina pectoris: Secondary | ICD-10-CM

## 2013-06-19 NOTE — Progress Notes (Signed)
Reviewed and agree.

## 2013-06-19 NOTE — Telephone Encounter (Signed)
06/19/2013   RE: [name] DOB: 1946/01/31 MRN: [id number]   Dear Dr. Haroldine Laws,    We have scheduled the above patient for an endoscopic procedure. Our records show that she is on anticoagulation therapy.   Please advise as to how long the patient may come off her therapy of Plavix prior to the procedure, which is scheduled for 07-22-13.  Please fax back/ or route the completed form to [place].   Sincerely,    Carlyle Dolly

## 2013-06-19 NOTE — Patient Instructions (Addendum)
It has been recommended to you by your physician that you have a(n)Colonoscopy  completed. Per your request, we did not schedule the procedure(s) today. Please contact our office at [phone] should you decide to have the procedure completed. 

## 2013-06-19 NOTE — Telephone Encounter (Signed)
Disregard phone note, patient cancelled procedure

## 2013-06-19 NOTE — Progress Notes (Signed)
06/19/2013 TONY FRISCIA [id number] December 30, 1945   History of Present Illness:  This is a pleasant 68 year old female who is known to Dr. Olevia Perches.  She is here today to schedule surveillance colonoscopy.  She has family history of colon cancer in her mother who was diagnosed in her mid-60's.  The patient's last colonoscopy was in 05/2008 at which time the study was normal.  The patient denies any complaints.  She is on Plavix, which is prescribed by Dr. Haroldine Laws.  Cardiac issues are stable according to his recent note just last month.  Current Medications, Allergies, Past Medical History, Past Surgical History, Family History and Social History were reviewed in [employer] record.   Physical Exam: BP 100/60  Pulse 52  Ht 5\' 8"  (1.727 m)  Wt 213 lb (96.616 kg)  BMI 32.39 kg/m2 General: Well developed black female in no acute distress Head: Normocephalic and atraumatic Eyes:  Sclerae anicteric, conjunctiva pink  Ears: Normal auditory acuity Lungs: Clear throughout to auscultation Heart: Bradycardic but regular rhythm Abdomen: Soft, non-distended.  Normal bowel sounds.  Non-tender. Rectal:  Deferred.  Will be done at the time of colonoscopy. Musculoskeletal: Symmetrical with no gross deformities  Extremities: Slight non-pitting edema in ankles and feet.  Neurological: Alert oriented x 4, grossly non-focal Psychological:  Alert and cooperative. Normal mood and affect  Assessment and Recommendations: -Family history of colon cancer:  Last colonoscopy in 05/2008.  Will schedule with Dr. Olevia Perches.  The risks, benefits, and alternatives were discussed with the patient and she consents to proceed.  The risks benefits and alternatives to a temporary hold of anti-coagulants/anti-platelets for the procedure were discussed with the patient she consents to proceed. Obtain clearance from Dr. Haroldine Laws to hold Plavix.

## 2013-07-22 ENCOUNTER — Encounter (HOSPITAL_COMMUNITY): Payer: Self-pay

## 2013-07-22 ENCOUNTER — Ambulatory Visit (HOSPITAL_COMMUNITY): Admit: 2013-07-22 | Payer: Self-pay | Admitting: Internal Medicine

## 2013-07-22 SURGERY — COLONOSCOPY
Anesthesia: Moderate Sedation

## 2013-10-21 ENCOUNTER — Encounter: Payer: Self-pay | Admitting: *Deleted

## 2013-10-21 NOTE — Progress Notes (Signed)
[place] Social Work  Clinical Social Work was referred by patient's spouse to review and complete healthcare advance directives.  Clinical Social Worker met with patient and patient's spouse in [place] office.  The patient designated spouse Tressia Labrum as their primary healthcare agent and Dartha Lodge as their secondary agent.  Patient also completed healthcare living will.    Clinical Social Worker notarized documents and made copies for patient/family. Clinical Social Worker will send documents to medical records to be scanned into patient's chart. Clinical Social Worker encouraged patient/family to contact with any additional questions or concerns.  Polo Riley, MSW, [facility] [phone]

## 2013-11-11 ENCOUNTER — Encounter (HOSPITAL_COMMUNITY): Payer: Self-pay | Admitting: Emergency Medicine

## 2013-11-11 ENCOUNTER — Emergency Department (HOSPITAL_COMMUNITY)
Admission: EM | Admit: 2013-11-11 | Discharge: 2013-11-11 | Disposition: A | Discharge status: Home / Self Care | Payer: Medicare Other | Attending: Emergency Medicine | Admitting: Emergency Medicine

## 2013-11-11 ENCOUNTER — Emergency Department (HOSPITAL_COMMUNITY): Payer: Medicare Other

## 2013-11-11 DIAGNOSIS — IMO0002 Reserved for concepts with insufficient information to code with codable children: Secondary | ICD-10-CM | POA: Diagnosis not present

## 2013-11-11 DIAGNOSIS — I2510 Atherosclerotic heart disease of native coronary artery without angina pectoris: Secondary | ICD-10-CM | POA: Diagnosis not present

## 2013-11-11 DIAGNOSIS — Z8669 Personal history of other diseases of the nervous system and sense organs: Secondary | ICD-10-CM | POA: Insufficient documentation

## 2013-11-11 DIAGNOSIS — R109 Unspecified abdominal pain: Secondary | ICD-10-CM | POA: Diagnosis not present

## 2013-11-11 DIAGNOSIS — Z7982 Long term (current) use of aspirin: Secondary | ICD-10-CM | POA: Insufficient documentation

## 2013-11-11 DIAGNOSIS — E785 Hyperlipidemia, unspecified: Secondary | ICD-10-CM | POA: Diagnosis not present

## 2013-11-11 DIAGNOSIS — Z7902 Long term (current) use of antithrombotics/antiplatelets: Secondary | ICD-10-CM | POA: Diagnosis not present

## 2013-11-11 DIAGNOSIS — R011 Cardiac murmur, unspecified: Secondary | ICD-10-CM | POA: Insufficient documentation

## 2013-11-11 DIAGNOSIS — Z9889 Other specified postprocedural states: Secondary | ICD-10-CM | POA: Insufficient documentation

## 2013-11-11 DIAGNOSIS — Z8719 Personal history of other diseases of the digestive system: Secondary | ICD-10-CM | POA: Insufficient documentation

## 2013-11-11 DIAGNOSIS — N200 Calculus of kidney: Secondary | ICD-10-CM

## 2013-11-11 DIAGNOSIS — R110 Nausea: Secondary | ICD-10-CM | POA: Diagnosis not present

## 2013-11-11 DIAGNOSIS — Z79899 Other long term (current) drug therapy: Secondary | ICD-10-CM | POA: Insufficient documentation

## 2013-11-11 DIAGNOSIS — I10 Essential (primary) hypertension: Secondary | ICD-10-CM | POA: Insufficient documentation

## 2013-11-11 DIAGNOSIS — R350 Frequency of micturition: Secondary | ICD-10-CM | POA: Insufficient documentation

## 2013-11-11 DIAGNOSIS — Z862 Personal history of diseases of the blood and blood-forming organs and certain disorders involving the immune mechanism: Secondary | ICD-10-CM | POA: Insufficient documentation

## 2013-11-11 LAB — URINALYSIS, ROUTINE W REFLEX MICROSCOPIC
Bilirubin Urine: NEGATIVE
GLUCOSE, UA: NEGATIVE mg/dL
KETONES UR: NEGATIVE mg/dL
Leukocytes, UA: NEGATIVE
Nitrite: NEGATIVE
PROTEIN: 30 mg/dL — AB
Specific Gravity, Urine: 1.015 (ref 1.005–1.030)
UROBILINOGEN UA: 0.2 mg/dL (ref 0.0–1.0)
pH: 5.5 (ref 5.0–8.0)

## 2013-11-11 LAB — COMPREHENSIVE METABOLIC PANEL
ALT: 11 U/L (ref 0–35)
AST: 22 U/L (ref 0–37)
Albumin: 3.9 g/dL (ref 3.5–5.2)
Alkaline Phosphatase: 85 U/L (ref 39–117)
Anion gap: 13 (ref 5–15)
BILIRUBIN TOTAL: 0.8 mg/dL (ref 0.3–1.2)
BUN: 24 mg/dL — ABNORMAL HIGH (ref 6–23)
CALCIUM: 10.3 mg/dL (ref 8.4–10.5)
CHLORIDE: 103 meq/L (ref 96–112)
CO2: 25 meq/L (ref 19–32)
CREATININE: 1.12 mg/dL — AB (ref 0.50–1.10)
GFR, EST AFRICAN AMERICAN: 57 mL/min — AB (ref >90)
GFR, EST NON AFRICAN AMERICAN: 49 mL/min — AB (ref >90)
GLUCOSE: 103 mg/dL — AB (ref 70–99)
Potassium: 4.2 mEq/L (ref 3.7–5.3)
Sodium: 141 mEq/L (ref 137–147)
Total Protein: 8.1 g/dL (ref 6.0–8.3)

## 2013-11-11 LAB — URINE MICROSCOPIC-ADD ON

## 2013-11-11 MED ORDER — ONDANSETRON 4 MG PO TBDP
4.0000 mg | ORAL_TABLET | Freq: Once | ORAL | Status: AC
  Administered 2013-11-11: 4 mg via ORAL
  Filled 2013-11-11: qty 1

## 2013-11-11 MED ORDER — HYDROCODONE-ACETAMINOPHEN 5-325 MG PO TABS: 1.0000 | ORAL_TABLET | ORAL | Status: DC | PRN

## 2013-11-11 MED ORDER — HYDROCODONE-ACETAMINOPHEN 5-325 MG PO TABS
1.0000 | ORAL_TABLET | Freq: Once | ORAL | Status: AC
  Administered 2013-11-11: 1 via ORAL
  Filled 2013-11-11: qty 1

## 2013-11-11 MED ORDER — MORPHINE SULFATE 4 MG/ML IJ SOLN
4.0000 mg | INTRAMUSCULAR | Status: DC | PRN
  Filled 2013-11-11: qty 1

## 2013-11-11 MED ORDER — TAMSULOSIN HCL 0.4 MG PO CAPS: 0.4000 mg | ORAL_CAPSULE | Freq: Every day | ORAL | Status: DC

## 2013-11-11 MED ORDER — PHENAZOPYRIDINE HCL 100 MG PO TABS
100.0000 mg | ORAL_TABLET | Freq: Once | ORAL | Status: AC
  Administered 2013-11-11: 100 mg via ORAL
  Filled 2013-11-11: qty 1

## 2013-11-11 MED ORDER — ONDANSETRON 4 MG PO TBDP: 4.0000 mg | ORAL_TABLET | Freq: Three times a day (TID) | ORAL | Status: DC | PRN

## 2013-11-11 MED ORDER — TAMSULOSIN HCL 0.4 MG PO CAPS
0.4000 mg | ORAL_CAPSULE | Freq: Once | ORAL | Status: AC
  Administered 2013-11-11: 0.4 mg via ORAL
  Filled 2013-11-11: qty 1

## 2013-11-11 NOTE — Discharge Instructions (Signed)

## 2013-11-11 NOTE — ED Provider Notes (Signed)
CSN: [id number]     Arrival date & time 11/11/13  1331 History   First MD Initiated Contact with Patient 11/11/13 1513     Chief Complaint  Patient presents with  . Urinary Frequency      HPI  Patient presents for suprapubic and right mid abdominal pain and urinary frequency. Symptoms started this morning. It's been out of the bathroom "all day" and hematuria. No fever chills shakes. Some pain in her flank earlier not now. No history of UTIs. History of ureteral stones.  Past Medical History  Diagnosis Date  . Internal hemorrhoids without mention of complication   . Neutropenia, unspecified   . Coronary artery disease   . Hyperlipidemia     takes pravachol  . Chest pain   . Hypertension   . Heart murmur   . GERD (gastroesophageal reflux disease)   . Shortness of breath     with exertion  . Headache(784.0)     hx of  . Neuromuscular disorder 2010    Fibromuscular dysplasia   Past Surgical History  Procedure Laterality Date  . Cardiac stents  2008  . Abdominal hysterectomy    . Gallstones removed    . Cardiac catheterization  2008  . Pars plana vitrectomy  06/26/2011    Procedure: PARS PLANA VITRECTOMY WITH 23 GAUGE;  Surgeon: Adonis Brook, MD;  Location: [facility];  Service: Ophthalmology;  Laterality: Left;  . Colonoscopy  2010    normal    Family History  Problem Relation Age of Onset  . Heart disease Mother   . Colon cancer Mother   . Heart attack Father   . Anesthesia problems Neg Hx   . Hypotension Neg Hx   . Malignant hyperthermia Neg Hx   . Pseudochol deficiency Neg Hx   . Breast cancer Sister     x 2   History  Substance Use Topics  . Smoking status: Never Smoker   . Smokeless tobacco: Never Used  . Alcohol Use: No   OB History   Grav Para Term Preterm Abortions TAB SAB Ect Mult Living                 Review of Systems  Constitutional: Negative for fever, chills, diaphoresis, appetite change and fatigue.  HENT: Negative for mouth sores, sore throat  and trouble swallowing.   Eyes: Negative for visual disturbance.  Respiratory: Negative for cough, chest tightness, shortness of breath and wheezing.   Cardiovascular: Negative for chest pain.  Gastrointestinal: Positive for nausea. Negative for vomiting, abdominal pain, diarrhea and abdominal distention.  Endocrine: Negative for polydipsia, polyphagia and polyuria.  Genitourinary: Positive for urgency, frequency and flank pain. Negative for dysuria and hematuria.  Musculoskeletal: Negative for gait problem.  Skin: Negative for color change, pallor and rash.  Neurological: Negative for dizziness, syncope, light-headedness and headaches.  Hematological: Does not bruise/bleed easily.  Psychiatric/Behavioral: Negative for behavioral problems and confusion.      Allergies  [place]  Home Medications   Prior to Admission medications   Medication Sig Start Date End Date Taking? Authorizing Provider  aspirin 81 MG tablet Take 81 mg by mouth daily.     Yes Historical Provider, MD  cholecalciferol (VITAMIN D) 1000 UNITS tablet Take 1,000 Units by mouth daily.   Yes Historical Provider, MD  clopidogrel (PLAVIX) 75 MG tablet Take 75 mg by mouth daily.     Yes Historical Provider, MD  nitroGLYCERIN (NITROSTAT) 0.4 MG SL tablet Place 0.4 mg under the  tongue every 5 (five) minutes as needed. For chest pain   Yes Historical Provider, MD  pravastatin (PRAVACHOL) 20 MG tablet Take 40 mg by mouth daily.    Yes Historical Provider, MD  valsartan-hydrochlorothiazide (DIOVAN-HCT) 80-12.5 MG per tablet Take 1 tablet by mouth Daily. 05/09/11  Yes Historical Provider, MD  HYDROcodone-acetaminophen (NORCO/VICODIN) 5-325 MG per tablet Take 1 tablet by mouth every 4 (four) hours as needed. 11/11/13   Tanna Furry, MD  ondansetron (ZOFRAN ODT) 4 MG disintegrating tablet Take 1 tablet (4 mg total) by mouth every 8 (eight) hours as needed for nausea. 11/11/13   Tanna Furry, MD  tamsulosin (FLOMAX) 0.4 MG CAPS capsule Take  1 capsule (0.4 mg total) by mouth daily. 11/11/13   Tanna Furry, MD   BP 152/75  Pulse 55  Temp(Src) 98.4 F (36.9 C) (Oral)  Resp 16  SpO2 100% Physical Exam  Constitutional: She is oriented to person, place, and time. She appears well-developed and well-nourished. No distress.  HENT:  Head: Normocephalic.  Eyes: Conjunctivae are normal. Pupils are equal, round, and reactive to light. No scleral icterus.  Neck: Normal range of motion. Neck supple. No thyromegaly present.  Cardiovascular: Normal rate and regular rhythm.  Exam reveals no gallop and no friction rub.   No murmur heard. Pulmonary/Chest: Effort normal and breath sounds normal. No respiratory distress. She has no wheezes. She has no rales.  Abdominal: Soft. Bowel sounds are normal. She exhibits no distension. There is no tenderness. There is no rebound.    Musculoskeletal: Normal range of motion.  Neurological: She is alert and oriented to person, place, and time.  Skin: Skin is warm and dry. No rash noted.  Psychiatric: She has a normal mood and affect. Her behavior is normal.    ED Course  Procedures (including critical care time) Labs Review Labs Reviewed  COMPREHENSIVE METABOLIC PANEL - Abnormal; Notable for the following:    Glucose, Bld 103 (*)    BUN 24 (*)    Creatinine, Ser 1.12 (*)    GFR calc non Af Amer 49 (*)    GFR calc Af Amer 57 (*)    All other components within normal limits  URINALYSIS, ROUTINE W REFLEX MICROSCOPIC - Abnormal; Notable for the following:    Hgb urine dipstick LARGE (*)    Protein, ur 30 (*)    All other components within normal limits  URINE MICROSCOPIC-ADD ON    Imaging Review Ct Abdomen Pelvis Wo Contrast  11/11/2013   CLINICAL DATA:  Hematuria.  Right flank pain.  EXAM: CT ABDOMEN AND PELVIS WITHOUT CONTRAST  TECHNIQUE: Multidetector CT imaging of the abdomen and pelvis was performed following the standard protocol without IV contrast.  COMPARISON:  None.  FINDINGS: Small  emphysematous blebs of both lung bases.  There is a 1 mm stone obstructing the right ureter at the right ureterovesical junction. There is slight right hydronephrosis. There are no renal calculi.  The gallbladder has been removed. Liver, bile ducts, spleen, pancreas, adrenal glands, and left kidney are normal. The bowel and appears normal including the terminal ileum and appendix.  Uterus and ovaries have been removed.  No acute osseous abnormality.  IMPRESSION: 1 mm stone obstructing the right ureter at the right ureterovesicle junction creating slight right hydronephrosis.   Electronically Signed   By: Rozetta Nunnery M.D.   On: 11/11/2013 16:41     EKG Interpretation None      MDM   Final diagnoses:  Kidney stone  Pain down to 1/10 after meds. CT scan shows a 1 mm distal UVJ stone. No sign of secondary infection. Appropriate for outpatient treatment.    Tanna Furry, MD 11/11/13 [phone]

## 2013-11-11 NOTE — ED Notes (Signed)
Per pt, states frequent urination since 9 am today-no burning-lower abdominal discomfort

## 2013-11-11 NOTE — ED Notes (Signed)
Pt in CT upon entering room. Will administer pain medication with pt return.

## 2013-11-11 NOTE — ED Notes (Signed)
Pt reports lower abdominal pain and urination frequency since 0930 this am. Pt denies hx of such.

## 2014-04-21 DIAGNOSIS — Z961 Presence of intraocular lens: Secondary | ICD-10-CM | POA: Insufficient documentation

## 2014-12-21 ENCOUNTER — Other Ambulatory Visit (HOSPITAL_COMMUNITY): Payer: Self-pay

## 2015-04-08 ENCOUNTER — Other Ambulatory Visit: Payer: Self-pay | Admitting: Internal Medicine

## 2015-04-08 DIAGNOSIS — R42 Dizziness and giddiness: Secondary | ICD-10-CM

## 2015-04-16 ENCOUNTER — Other Ambulatory Visit (HOSPITAL_COMMUNITY): Payer: Self-pay

## 2015-04-19 ENCOUNTER — Ambulatory Visit (INDEPENDENT_AMBULATORY_CARE_PROVIDER_SITE_OTHER): Payer: Medicare Other | Admitting: Neurology

## 2015-04-19 ENCOUNTER — Encounter: Payer: Self-pay | Admitting: Neurology

## 2015-04-19 VITALS — BP 116/58 | HR 51 | Temp 97.6°F | Ht 68.0 in | Wt 216.0 lb

## 2015-04-19 DIAGNOSIS — H8110 Benign paroxysmal vertigo, unspecified ear: Secondary | ICD-10-CM

## 2015-04-19 DIAGNOSIS — Z8673 Personal history of transient ischemic attack (TIA), and cerebral infarction without residual deficits: Secondary | ICD-10-CM | POA: Diagnosis not present

## 2015-04-19 DIAGNOSIS — I672 Cerebral atherosclerosis: Secondary | ICD-10-CM

## 2015-04-19 DIAGNOSIS — R42 Dizziness and giddiness: Secondary | ICD-10-CM

## 2015-04-19 DIAGNOSIS — G450 Vertebro-basilar artery syndrome: Secondary | ICD-10-CM

## 2015-04-19 NOTE — Patient Instructions (Signed)
Remember to drink plenty of fluid, eat healthy meals and do not skip any meals. Try to eat protein with a every meal and eat a healthy snack such as fruit or nuts in between meals. Try to keep a regular sleep-wake schedule and try to exercise daily, particularly in the form of walking, 20-30 minutes a day, if you can.   As far as diagnostic testing: MRi brain, CT or the vessels  I would like to see you back in 3 months, sooner if we need to. Please call us with any interim questions, concerns, problems, updates or refill requests.   Our phone number is [phone]. We also have an after hours call service for urgent matters and there is a physician on-call for urgent questions. For any emergencies you know to call 911 or go to the nearest emergency room

## 2015-04-19 NOTE — Progress Notes (Addendum)
[facility]    Provider:  Dr Jaynee Eagles Referring Provider: [name], MD Primary Care Physician:  Leslie Gravel, MD  CC:  Dizziness  HPI:  [name] is a 70 y.o. female here as a referral from Dr. Minna Hart for Dizziness. PMHx HLD, CAD, HTN, headache. Dizziness started over a year ago. It really hit her 2-3 weeks ago when she got up and she felt like the room was spinning, she couldn't maneuver, was walking to the side. She went back to bed. She got up in a few hours and it was gone. When she laid still she felt better. When she moved or got up she felt bad. No falls. Sometimes when she walks she feels like she gets pulled to the right or left, not really dizziness every day. No falls. No physical therapy yet. She may feel a little lightheadedness when she is walking. She just had one headache during the severe vertigo as stated above. She had some nausea no vomiting. She went to the ENT and had hearing evaluation. No imbalance or numbness or tingling in the feett. Never feels bad sitting or laying down only when standing or walking. If she sits down she feels better or feels better after she has moved around. She doesn't drink a lot of water, 2-3 glasses of fluid max daily.   Reviewed notes, labs and imaging from outside physicians, which showed:  Personally reviewed images of CTA of the head and neck and MRI/MRA of the brain 2011 and agree with the following:  Intracranial atherosclerotic type changes on CTA of the head.  MRI HEAD  Findings: No acute infarct. 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 No intracranial hemorrhage. Moderate white matter type changes most consistent with result of small vessel disease. Mild global atrophy without hydrocephalus. No intracranial mass lesion detected on this unenhanced exam.  Artifact right frontal subcutaneous region.  Mild exophthalmos. Slightly heterogeneous appearance of the parotid glands. This  has been described in patients with Sjogrens isease and sarcoidosis.  Review of Systems: Patient complains of symptoms per HPI as well as the following symptoms: No CP, no SOB. Pertinent negatives per HPI. All others negative.   Social History   Social History  . Marital Status: Married    Spouse Name: [name]  . Number of Children: 1  . Years of Education: 20   Occupational History  . Retired    Social History Main Topics  . Smoking status: Never Smoker   . Smokeless tobacco: Never Used  . Alcohol Use: No  . Drug Use: No  . Sexual Activity: Yes    Birth Control/ Protection: Surgical   Other Topics Concern  . Not on file   Social History Narrative   Lives with husband   Caffeine use: Tea rare    Family History  Problem Relation Age of Onset  . Heart disease Mother   . Colon cancer Mother   . Heart attack Father   . Anesthesia problems Neg Hx   . Hypotension Neg Hx   . Malignant hyperthermia Neg Hx   . Pseudochol deficiency Neg Hx   . Breast cancer Sister     x 2    Past Medical History  Diagnosis Date  . Internal hemorrhoids without mention of complication   . Neutropenia, unspecified ([place])   . Coronary artery disease   . Hyperlipidemia     takes pravachol  . Chest pain   . Hypertension   . Heart  murmur   . GERD (gastroesophageal reflux disease)   . Shortness of breath     with exertion  . Headache(784.0)     hx of  . Neuromuscular disorder ([place]) 2010    Fibromuscular dysplasia    Past Surgical History  Procedure Laterality Date  . Cardiac stents  2008  . Abdominal hysterectomy    . Gallstones removed    . Cardiac catheterization  2008  . Pars plana vitrectomy  06/26/2011    Procedure: PARS PLANA VITRECTOMY WITH 23 GAUGE;  Surgeon: Adonis Brook, MD;  Location: [facility];  Service: Ophthalmology;  Laterality: Left;  . Colonoscopy  2010    normal     Current Outpatient Prescriptions  Medication Sig Dispense Refill  . aspirin 81 MG tablet Take 81  mg by mouth daily.      . clopidogrel (PLAVIX) 75 MG tablet Take 75 mg by mouth daily.      . nitroGLYCERIN (NITROSTAT) 0.4 MG SL tablet Place 0.4 mg under the tongue every 5 (five) minutes as needed. For chest pain    . pravastatin (PRAVACHOL) 40 MG tablet Take 40 mg by mouth daily.    . valsartan-hydrochlorothiazide (DIOVAN-HCT) 80-12.5 MG per tablet Take 1 tablet by mouth Daily.    . [DISCONTINUED] Valsartan (DIOVAN PO) Take by mouth.      No current facility-administered medications for this visit.    Allergies as of 04/19/2015 - Review Complete 04/19/2015  Allergen Reaction Noted  . Nalfon [fenoprofen calcium] Other (See Comments) 05/17/2011    Vitals: BP 116/58 mmHg  Pulse 51  Temp(Src) 97.6 F (36.4 C) (Oral)  Ht 5\' 8"  (1.727 m)  Wt 216 lb (97.977 kg)  BMI 32.85 kg/m2 Last Weight:  Wt Readings from Last 1 Encounters:  04/19/15 216 lb (97.977 kg)   Last Height:   Ht Readings from Last 1 Encounters:  04/19/15 5\' 8"  (1.727 m)   Physical exam: Exam: Gen: NAD, conversant, well nourised, obese, well groomed                     CV: RRR, no MRG.  Eyes: Conjunctivae clear without exudates or hemorrhage  Neuro: Detailed Neurologic Exam  Speech:    Speech is normal; fluent and spontaneous with normal comprehension.  Cognition:    The patient is oriented to person, place, and time;     recent and remote memory intact;     language fluent;     normal attention, concentration,     fund of knowledge Cranial Nerves:    The pupils are equal, round, and reactive to light. The fundi are normal. Visual fields are full to finger confrontation. Extraocular movements are intact. Trigeminal sensation is intact and the muscles of mastication are normal. The face is symmetric. The palate elevates in the midline. Hearing intact. Voice is normal. Shoulder shrug is normal. The tongue has normal motion without fasciculations.   Coordination:    No dysmetria  Gait:    Heel-toe and  tandem gait are normal.   Motor Observation:    No asymmetry, no atrophy, and no involuntary movements noted. Tone:    Normal muscle tone.    Posture:    Posture is normal. normal erect    Strength:    Strength is V/V in the upper and lower limbs.      Sensation: intact to LT     Reflex Exam:  DTR's:    Deep tendon reflexes in the upper and lower  extremities are normal bilaterally.   Toes:    Difficult to assess due to brisk withdrawal but possible right toe upgoing Clonus:    Clonus is absent.     Assessment/Plan:    [name] is a 70 y.o. female here as a referral from Dr. Minna Hart for Dizziness. PMHx HLD, CAD, HTN, headache. Dizziness started over a year ago with one episode of severe vertigo. She feels lightheadedness when she is walking. She just had one headache during the severe vertigo as stated above, no significant headaches. She had some nausea no vomiting. She went to the ENT and had hearing evaluation. No imbalance or numbness or tingling in the feett. Never feels bad sitting or laying down only when standing or walking. If she sits down she feels better or feels better after she has moved around. She doesn't drink a lot of water, 2-3 glasses of fluid max daily.    CTA of the head in 2011 showed Intracranial atherosclerotic type changes . BP a little low today 116/58 P 51 and she reports very limited daily fluid hydration. Will repeat CT or the head and neck to further evaluate for progressive Intracranial atherosclerosis which could cause  cerebrovascular insufficiency due to low blood pressure. Recommend keeping well hydrated and keeping systolic BP above [id number].  Will check BMP today.   Sarina Ill, MD  [facility] [address] [place],  [address]  Phone [phone] Fax [phone]

## 2015-04-20 ENCOUNTER — Telehealth: Payer: Self-pay | Admitting: *Deleted

## 2015-04-20 LAB — BASIC METABOLIC PANEL
BUN/Creatinine Ratio: 19 (ref 11–26)
BUN: 19 mg/dL (ref 8–27)
CALCIUM: 9.2 mg/dL (ref 8.7–10.3)
CO2: 22 mmol/L (ref 18–29)
CREATININE: 1.01 mg/dL — AB (ref 0.57–1.00)
Chloride: 101 mmol/L (ref 96–106)
GFR, EST AFRICAN AMERICAN: 65 mL/min/{1.73_m2} (ref >59)
GFR, EST NON AFRICAN AMERICAN: 57 mL/min/{1.73_m2} — AB (ref >59)
Glucose: 81 mg/dL (ref 65–99)
Potassium: 4.7 mmol/L (ref 3.5–5.2)
Sodium: 140 mmol/L (ref 134–144)

## 2015-04-20 NOTE — Telephone Encounter (Signed)
Called pt about lab results per Dr Jaynee Eagles note. Pt verbalized understanding.

## 2015-04-20 NOTE — Telephone Encounter (Signed)
-----   Message from Melvenia Beam, MD sent at 04/20/2015  1:57 PM EST ----- BMP unremarkable. Creatinine is slightly elevated but this is stable. thanks

## 2015-04-21 ENCOUNTER — Other Ambulatory Visit: Payer: BC Managed Care – PPO

## 2015-04-26 ENCOUNTER — Other Ambulatory Visit: Payer: BC Managed Care – PPO

## 2015-04-28 ENCOUNTER — Ambulatory Visit
Admission: RE | Admit: 2015-04-28 | Discharge: 2015-04-28 | Disposition: A | Discharge status: Home / Self Care | Payer: PRIVATE HEALTH INSURANCE | Source: Ambulatory Visit | Attending: Neurology | Admitting: Neurology

## 2015-04-28 ENCOUNTER — Ambulatory Visit
Admission: RE | Admit: 2015-04-28 | Discharge: 2015-04-28 | Disposition: A | Discharge status: Home / Self Care | Payer: PRIVATE HEALTH INSURANCE | Source: Ambulatory Visit | Attending: Internal Medicine | Admitting: Internal Medicine

## 2015-04-28 DIAGNOSIS — G450 Vertebro-basilar artery syndrome: Secondary | ICD-10-CM

## 2015-04-28 DIAGNOSIS — R42 Dizziness and giddiness: Secondary | ICD-10-CM

## 2015-04-28 DIAGNOSIS — I672 Cerebral atherosclerosis: Secondary | ICD-10-CM

## 2015-04-28 DIAGNOSIS — Z8673 Personal history of transient ischemic attack (TIA), and cerebral infarction without residual deficits: Secondary | ICD-10-CM

## 2015-04-28 MED ORDER — IOPAMIDOL (ISOVUE-370) INJECTION 76%
100.0000 mL | Freq: Once | INTRAVENOUS | Status: AC | PRN
  Administered 2015-04-28: 100 mL via INTRAVENOUS

## 2015-04-29 ENCOUNTER — Telehealth: Payer: Self-pay | Admitting: Neurology

## 2015-04-30 ENCOUNTER — Telehealth: Payer: Self-pay | Admitting: *Deleted

## 2015-04-30 NOTE — Telephone Encounter (Signed)
-----   Message from Melvenia Beam, MD sent at 04/29/2015  5:58 PM EST ----- Terrence Dupont, most of the imaging is stable not significantly changed since 2011 which is good news. The vertebrobasilar system has good blood flow through it and there is no narrowing here to account for her dizziness. No new strokes seen in her brain. She does have atherosclerosis in the arteries and some of this has mildly progressed since 2011 and so she just needs to continue to take care of herself, watch her cholesterol and glucose, diet and exercise. If she would like I can have them back into the office and review the mri of the brain and the imaging of the vessels in her head and neck or we can review at our follow up in May.  I do think she needs to stay hydrated though which may help with her dizziness. Thanks.

## 2015-04-30 NOTE — Telephone Encounter (Signed)
Called and spoke to pt about results per Dr Jaynee Eagles note. Pt verbalized understanding and wants to just review images at f/u in May. Told her to call in the mean time if she has questions/concerns. She understands.

## 2015-05-06 NOTE — Telephone Encounter (Signed)
error 

## 2015-06-08 ENCOUNTER — Ambulatory Visit: Payer: BC Managed Care – PPO | Admitting: Cardiology

## 2015-06-29 DIAGNOSIS — H35371 Puckering of macula, right eye: Secondary | ICD-10-CM | POA: Insufficient documentation

## 2015-07-07 ENCOUNTER — Ambulatory Visit (INDEPENDENT_AMBULATORY_CARE_PROVIDER_SITE_OTHER): Payer: Medicare Other | Admitting: Cardiology

## 2015-07-07 ENCOUNTER — Encounter: Payer: Self-pay | Admitting: Cardiology

## 2015-07-07 VITALS — BP 110/74 | HR 48 | Ht 68.0 in | Wt 210.2 lb

## 2015-07-07 DIAGNOSIS — R0609 Other forms of dyspnea: Secondary | ICD-10-CM

## 2015-07-07 DIAGNOSIS — I2510 Atherosclerotic heart disease of native coronary artery without angina pectoris: Secondary | ICD-10-CM

## 2015-07-07 DIAGNOSIS — E785 Hyperlipidemia, unspecified: Secondary | ICD-10-CM | POA: Diagnosis not present

## 2015-07-07 DIAGNOSIS — I10 Essential (primary) hypertension: Secondary | ICD-10-CM

## 2015-07-07 DIAGNOSIS — R002 Palpitations: Secondary | ICD-10-CM

## 2015-07-07 LAB — BASIC METABOLIC PANEL
BUN: 21 mg/dL (ref 7–25)
CO2: 25 mmol/L (ref 20–31)
Calcium: 9.1 mg/dL (ref 8.6–10.4)
Chloride: 104 mmol/L (ref 98–110)
Creat: 1.01 mg/dL — ABNORMAL HIGH (ref 0.60–0.93)
Glucose, Bld: 93 mg/dL (ref 65–99)
Potassium: 3.9 mmol/L (ref 3.5–5.3)
Sodium: 138 mmol/L (ref 135–146)

## 2015-07-07 LAB — HEPATIC FUNCTION PANEL
ALT: 9 U/L (ref 6–29)
AST: 19 U/L (ref 10–35)
Albumin: 3.9 g/dL (ref 3.6–5.1)
Alkaline Phosphatase: 66 U/L (ref 33–130)
Bilirubin, Direct: 0.2 mg/dL (ref <=0.2)
Indirect Bilirubin: 0.7 mg/dL (ref 0.2–1.2)
Total Bilirubin: 0.9 mg/dL (ref 0.2–1.2)
Total Protein: 7.2 g/dL (ref 6.1–8.1)

## 2015-07-07 LAB — LIPID PANEL
Cholesterol: 151 mg/dL (ref 125–200)
HDL: 65 mg/dL (ref >=46)
LDL Cholesterol: 73 mg/dL (ref <130)
Total CHOL/HDL Ratio: 2.3 Ratio (ref <=5.0)
Triglycerides: 65 mg/dL (ref <150)
VLDL: 13 mg/dL (ref <30)

## 2015-07-07 LAB — CBC
HCT: 37.5 % (ref 35.0–45.0)
Hemoglobin: 12.5 g/dL (ref 11.7–15.5)
MCH: 29.4 pg (ref 27.0–33.0)
MCHC: 33.3 g/dL (ref 32.0–36.0)
MCV: 88.2 fL (ref 80.0–100.0)
MPV: 10.5 fL (ref 7.5–12.5)
Platelets: 204 10*3/uL (ref 140–400)
RBC: 4.25 MIL/uL (ref 3.80–5.10)
RDW: 15.9 % — ABNORMAL HIGH (ref 11.0–15.0)
WBC: 3.1 10*3/uL — ABNORMAL LOW (ref 3.8–10.8)

## 2015-07-07 LAB — TSH: TSH: 1.88 mIU/L

## 2015-07-07 NOTE — Patient Instructions (Signed)
Your physician recommends that you continue on your current medications as directed. Please refer to the Current Medication list given to you today.  Your physician recommends that you return for lab work in: today (BMET, CBC, TSH, LFT, LIPIDS)  Your physician has requested that you have en exercise stress myoview. For further information please visit [email or web address]. Please follow instruction sheet, as given.  Your physician has recommended that you wear a holter monitor. Holter monitors are medical devices that record the heart's electrical activity. Doctors most often use these monitors to diagnose arrhythmias. Arrhythmias are problems with the speed or rhythm of the heartbeat. The monitor is a small, portable device. You can wear one while you do your normal daily activities. This is usually used to diagnose what is causing palpitations/syncope (passing out).  Your physician wants you to follow-up in: [place] will receive a reminder letter in the mail two months in advance. If you don't receive a letter, please call our office to schedule the follow-up appointment.

## 2015-07-07 NOTE — Progress Notes (Signed)
Cardiology Office Note    Date:  07/08/2015   ID:  Leslie, Hart October 05, 1945, MRN [id number]  PCP:  [name], MD  Cardiologist: Dr Haroldine Laws -->  [name], MD   Chief Complaint  Patient presents with  . Establish Care    on going card care    History of Present Illness:  [name] is a 70 y.o. female   [name] is a delightful 69 year old woman with a history of hypertension, hyperlipidemia, fibromuscular dysplasia of the intracranial vessels, and previous left internal carotid artery dissection. She also has a history of coronary artery disease. She is status post previous stenting to the LAD and diagonal. Most recently, she had a Promus drug-eluting stent to the second diagonal in December 2008.   Was in cardiac rehab in December 2011 and experienced CP. Took NTG and then had a presyncopal episode with SBP 80. With this developed transient L facial dropp thought to be due to cerebral hypoperfusion. CT/MRI normal. R/o for MI with serial cardiac markers. Lexiscan Myoview 12/11 showed EF 72% with normal perfusion.   Unable to tolerate b-blockers due to bradycardia.   07/07/15 - She returns for f/u, hasn't seen Dr Haroldine Laws in 2 years. She has noticed worsening DOE, no CP. Sh is not very active. She has been also noticing palpitations that start and end of of a sudden, couple times a day some days, the other days not at all, on exersion and at night. Denies syncope. No LE edema, orthopnea, PND. Lipids not checked in a while. On Pravastatin. No side effects.  Echo today EF 60-65% Grade 2 DD. RV normal. Mild AI   Past Medical History  Diagnosis Date  . Internal hemorrhoids without mention of complication   . Neutropenia, unspecified ([place])   . Coronary artery disease   . Hyperlipidemia     takes pravachol  . Chest pain   . Hypertension   . Heart murmur   . GERD (gastroesophageal reflux disease)   . Shortness of breath     with exertion  . Headache(784.0)      hx of  . Neuromuscular disorder ([place]) 2010    Fibromuscular dysplasia    Past Surgical History  Procedure Laterality Date  . Cardiac stents  2008  . Abdominal hysterectomy    . Gallstones removed    . Cardiac catheterization  2008  . Pars plana vitrectomy  06/26/2011    Procedure: PARS PLANA VITRECTOMY WITH 23 GAUGE;  Surgeon: Adonis Brook, MD;  Location: [facility];  Service: Ophthalmology;  Laterality: Left;  . Colonoscopy  2010    normal     Current Medications: Outpatient Prescriptions Prior to Visit  Medication Sig Dispense Refill  . aspirin 81 MG tablet Take 81 mg by mouth daily.      . clopidogrel (PLAVIX) 75 MG tablet Take 75 mg by mouth daily.      . nitroGLYCERIN (NITROSTAT) 0.4 MG SL tablet Place 0.4 mg under the tongue every 5 (five) minutes as needed. For chest pain    . pravastatin (PRAVACHOL) 40 MG tablet Take 40 mg by mouth daily.    . valsartan-hydrochlorothiazide (DIOVAN-HCT) 80-12.5 MG per tablet Take 1 tablet by mouth Daily.     No facility-administered medications prior to visit.     Allergies:   Nalfon   Social History   Social History  . Marital Status: Married    Spouse Name: [name]  . Number of Children: 1  .  Years of Education: 20   Occupational History  . Retired    Social History Main Topics  . Smoking status: Never Smoker   . Smokeless tobacco: Never Used  . Alcohol Use: No  . Drug Use: No  . Sexual Activity: Yes    Birth Control/ Protection: Surgical   Other Topics Concern  . None   Social History Narrative   Lives with husband   Caffeine use: Tea rare     Family History:  The patient's family history includes Breast cancer in her sister; Colon cancer in her mother; Heart attack in her father; Heart disease in her mother. There is no history of Anesthesia problems, Hypotension, Malignant hyperthermia, or Pseudochol deficiency.   ROS:   Please see the history of present illness.    ROS All other systems reviewed and are  negative.   PHYSICAL EXAM:   VS:  BP 110/74 mmHg  Pulse 48  Ht 5\' 8"  (1.727 m)  Wt 210 lb 3.2 oz (95.346 kg)  BMI 31.97 kg/m2  SpO2 97%   GEN: Well nourished, well developed, in no acute distress HEENT: normal Neck: no JVD, carotid bruits, or masses Cardiac: RRR; 2/6 DIAST murmurs, rubs, or gallops,no edema  Respiratory:  clear to auscultation bilaterally, normal work of breathing GI: soft, nontender, nondistended, + BS MS: no deformity or atrophy Skin: warm and dry, no rash Neuro:  Alert and Oriented x 3, Strength and sensation are intact Psych: euthymic mood, full affect  Wt Readings from Last 3 Encounters:  07/07/15 210 lb 3.2 oz (95.346 kg)  04/19/15 216 lb (97.977 kg)  06/19/13 213 lb (96.616 kg)      Studies/Labs Reviewed:   EKG:  SB, normal ECG  Recent Labs: 07/07/2015: ALT 9; BUN 21; Creat 1.01*; Hemoglobin 12.5; Platelets 204; Potassium 3.9; Sodium 138; TSH 1.88   Lipid Panel    Component Value Date/Time   CHOL 151 07/07/2015 0913   TRIG 65 07/07/2015 0913   HDL 65 07/07/2015 0913   CHOLHDL 2.3 07/07/2015 0913   VLDL 13 07/07/2015 0913   LDLCALC 73 07/07/2015 0913    Additional studies/ records that were reviewed today include:   TTE: 2015  - Left ventricle: The cavity size was normal. Wall thickness was increased in a pattern of mild LVH. Systolic function was normal. The estimated ejection fraction was in the range of 60% to 65%. Wall motion was normal; there were no regional wall motion abnormalities. - Aortic valve: Trivial regurgitation.    ASSESSMENT:    1. Atherosclerosis of native coronary artery of native heart without angina pectoris   2. Essential hypertension, benign   3. Hyperlipidemia   4. Palpitations   5. DOE (dyspnea on exertion)      PLAN:  In order of problems listed above:  1. CAD - new worsening DOE - we will order an exercise nuclear stress test to evaluate for ischemia. Continue current regimen. Given first  generation DES in LAD would continue Plavix indefinitely. Continue ASA 81. We checked lipids today and all at goal, we will continue the same dose of pravastatin.   2. HTN - Blood pressure well controlled. Continue current regimen.  3. HLP - as above  4. Palpitations - start 48 hours Holter monitor   Medication Adjustments/Labs and Tests Ordered: Current medicines are reviewed at length with the patient today.  Concerns regarding medicines are outlined above.  Medication changes, Labs and Tests ordered today are listed in the Patient Instructions below.  Patient Instructions  Your physician recommends that you continue on your current medications as directed. Please refer to the Current Medication list given to you today.  Your physician recommends that you return for lab work in: today (BMET, CBC, TSH, LFT, LIPIDS)  Your physician has requested that you have en exercise stress myoview. For further information please visit [email or web address]. Please follow instruction sheet, as given.  Your physician has recommended that you wear a holter monitor. Holter monitors are medical devices that record the heart's electrical activity. Doctors most often use these monitors to diagnose arrhythmias. Arrhythmias are problems with the speed or rhythm of the heartbeat. The monitor is a small, portable device. You can wear one while you do your normal daily activities. This is usually used to diagnose what is causing palpitations/syncope (passing out).  Your physician wants you to follow-up in: [place] will receive a reminder letter in the mail two months in advance. If you don't receive a letter, please call our office to schedule the follow-up appointment.        Signed, [name], MD  07/08/2015 12:17 AM    [address], [place], [place]  [address] Phone: [phone]; Fax: [phone]

## 2015-07-15 ENCOUNTER — Telehealth (HOSPITAL_COMMUNITY): Payer: Self-pay | Admitting: *Deleted

## 2015-07-15 NOTE — Telephone Encounter (Signed)
Attempted to call patient regarding nuclear appointment- no answer. Hubbard Robinson, RN

## 2015-07-20 ENCOUNTER — Ambulatory Visit (INDEPENDENT_AMBULATORY_CARE_PROVIDER_SITE_OTHER): Payer: Medicare Other

## 2015-07-20 DIAGNOSIS — E785 Hyperlipidemia, unspecified: Secondary | ICD-10-CM

## 2015-07-20 DIAGNOSIS — R002 Palpitations: Secondary | ICD-10-CM | POA: Diagnosis not present

## 2015-07-20 DIAGNOSIS — I10 Essential (primary) hypertension: Secondary | ICD-10-CM | POA: Diagnosis not present

## 2015-07-20 DIAGNOSIS — I2510 Atherosclerotic heart disease of native coronary artery without angina pectoris: Secondary | ICD-10-CM | POA: Diagnosis not present

## 2015-07-20 DIAGNOSIS — R0609 Other forms of dyspnea: Secondary | ICD-10-CM

## 2015-07-21 ENCOUNTER — Encounter: Payer: Self-pay | Admitting: Neurology

## 2015-07-21 ENCOUNTER — Ambulatory Visit (INDEPENDENT_AMBULATORY_CARE_PROVIDER_SITE_OTHER): Payer: Medicare Other | Admitting: Neurology

## 2015-07-21 ENCOUNTER — Ambulatory Visit (HOSPITAL_COMMUNITY): Payer: Medicare Other | Attending: Cardiovascular Disease

## 2015-07-21 VITALS — BP 119/68 | HR 53 | Ht 68.0 in | Wt 211.6 lb

## 2015-07-21 DIAGNOSIS — I10 Essential (primary) hypertension: Secondary | ICD-10-CM

## 2015-07-21 DIAGNOSIS — E785 Hyperlipidemia, unspecified: Secondary | ICD-10-CM | POA: Diagnosis not present

## 2015-07-21 DIAGNOSIS — R0609 Other forms of dyspnea: Secondary | ICD-10-CM | POA: Diagnosis not present

## 2015-07-21 DIAGNOSIS — E538 Deficiency of other specified B group vitamins: Secondary | ICD-10-CM | POA: Diagnosis not present

## 2015-07-21 DIAGNOSIS — R002 Palpitations: Secondary | ICD-10-CM | POA: Diagnosis not present

## 2015-07-21 DIAGNOSIS — R42 Dizziness and giddiness: Secondary | ICD-10-CM

## 2015-07-21 DIAGNOSIS — R413 Other amnesia: Secondary | ICD-10-CM

## 2015-07-21 DIAGNOSIS — I2510 Atherosclerotic heart disease of native coronary artery without angina pectoris: Secondary | ICD-10-CM

## 2015-07-21 LAB — MYOCARDIAL PERFUSION IMAGING
LV dias vol: 95 mL (ref 46–106)
LV sys vol: 27 mL
Peak HR: 100 {beats}/min
RATE: 0.27
Rest HR: 44 {beats}/min
SDS: 1
SRS: 4
SSS: 5
TID: 0.91

## 2015-07-21 MED ORDER — TECHNETIUM TC 99M TETROFOSMIN IV KIT
32.8000 | PACK | Freq: Once | INTRAVENOUS | Status: AC | PRN
  Administered 2015-07-21: 32.8 via INTRAVENOUS
  Filled 2015-07-21: qty 33

## 2015-07-21 MED ORDER — TECHNETIUM TC 99M TETROFOSMIN IV KIT
11.0000 | PACK | Freq: Once | INTRAVENOUS | Status: AC | PRN
  Administered 2015-07-21: 11 via INTRAVENOUS
  Filled 2015-07-21: qty 11

## 2015-07-21 MED ORDER — REGADENOSON 0.4 MG/5ML IV SOLN
0.4000 mg | Freq: Once | INTRAVENOUS | Status: AC
  Administered 2015-07-21: 0.4 mg via INTRAVENOUS

## 2015-07-21 NOTE — Progress Notes (Signed)
[id number]    Provider:  Dr Jaynee Eagles Referring Provider: [name], MD Primary Care Physician:  [name], MD  CC: Dizziness  Interval history 07/21/2015: She is feeling better, the dizziness is improved. Trying to hydrate better. Drinking more after during the day. Recommend close follow up with primary care for aggressive management of vascular risk factors. Continue aspirin,plavix and statin. Reviewed imaging of the brain, no strokes, white matter changes as discussed. She just had a complete workup by cardiology. Recommend keeping hydrated, keeping systolic above 123456. She has had a pain in the head, twice, brief, happened 3 weeks ago, sharp pain in in the head. She is having memory problems. She can't remember things as well. She is not forgetting to pay bills, she is not getting lost, no accidents in the home, she forgets names of people. No Fhx of memory of dementia. Mom died at 71 cognitively intact, dad died at 11 and no memory problems.   MRI of the brain 04/28/2015: 1. No acute intracranial abnormality. 2. Signal changes most suggestive of chronic small vessel disease. Mild progression in the cerebral white matter and pons since 2011.  CTA of the head and neck 04/28/2015: : 1. Stable vertebrobasilar system since 2011 with minimal atherosclerosis. There is only mild stenosis at the origin of the non dominant right vertebral artery, and minimal if any bilateral distal vertebral artery stenosis. 2. There is moderate bilateral PCA atherosclerosis and stenosis, but this is chronic and stable to mildly progressed since 2011. 3. No carotid atherosclerosis at the arch or in the neck. Chronic calcified plaque in both ICA siphons greater on the left. Progressed mild chronic left supraclinoid ICA stenosis. 4. Bilateral MCA M3 branch atherosclerosis and stenosis appears progressed since 2011, and most severe on the right. 5. Stable CT appearance of the brain since 2011. No acute  intracranial abnormality.  HPI: [name] is a 70 y.o. female here as a referral from Dr. [name] for Dizziness. PMHx HLD, CAD, HTN, headache. Dizziness started over a year ago. It really hit her 2-3 weeks ago when she got up and she felt like the room was spinning, she couldn't maneuver, was walking to the side. She went back to bed. She got up in a few hours and it was gone. When she laid still she felt better. When she moved or got up she felt bad. No falls. Sometimes when she walks she feels like she gets pulled to the right or left, not really dizziness every day. No falls. No physical therapy yet. She may feel a little lightheadedness when she is walking. She just had one headache during the severe vertigo as stated above. She had some nausea no vomiting. She went to the ENT and had hearing evaluation. No imbalance or numbness or tingling in the feett. Never feels bad sitting or laying down only when standing or walking. If she sits down she feels better or feels better after she has moved around. She doesn't drink a lot of water, 2-3 glasses of fluid max daily.   Reviewed notes, labs and imaging from outside physicians, which showed:  Personally reviewed images of CTA of the head and neck and MRI/MRA of the brain 2011 and agree with the following:  Intracranial atherosclerotic type changes on CTA of the head.  MRI HEAD  Findings: No acute infarct. 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 No intracranial hemorrhage. Moderate white matter type changes most consistent with result of small vessel  disease. Mild global atrophy without hydrocephalus. No intracranial mass lesion detected on this unenhanced exam.  Artifact right frontal subcutaneous region.  Mild exophthalmos. Slightly heterogeneous appearance of the parotid glands. This has been described in patients with Sjogrens isease and sarcoidosis.  Review of Systems: Patient complains of symptoms per HPI  as well as the following symptoms: No CP, no SOB. Pertinent negatives per HPI. All others negative.   Social History   Social History  . Marital Status: Married    Spouse Name: [name]  . Number of Children: 1  . Years of Education: 20   Occupational History  . Retired    Social History Main Topics  . Smoking status: Never Smoker   . Smokeless tobacco: Never Used  . Alcohol Use: No  . Drug Use: No  . Sexual Activity: Yes    Birth Control/ Protection: Surgical   Other Topics Concern  . Not on file   Social History Narrative   Lives with husband   Caffeine use: Tea rare    Family History  Problem Relation Age of Onset  . Heart disease Mother   . Colon cancer Mother   . Heart attack Father   . Anesthesia problems Neg Hx   . Hypotension Neg Hx   . Malignant hyperthermia Neg Hx   . Pseudochol deficiency Neg Hx   . Breast cancer Sister     x 2    Past Medical History  Diagnosis Date  . Internal hemorrhoids without mention of complication   . Neutropenia, unspecified ([place])   . Coronary artery disease   . Hyperlipidemia     takes pravachol  . Chest pain   . Hypertension   . Heart murmur   . GERD (gastroesophageal reflux disease)   . Shortness of breath     with exertion  . Headache(784.0)     hx of  . Neuromuscular disorder ([place]) 2010    Fibromuscular dysplasia    Past Surgical History  Procedure Laterality Date  . Cardiac stents  2008  . Abdominal hysterectomy    . Gallstones removed    . Cardiac catheterization  2008  . Pars plana vitrectomy  06/26/2011    Procedure: PARS PLANA VITRECTOMY WITH 23 GAUGE;  Surgeon: Adonis Brook, MD;  Location: [place];  Service: Ophthalmology;  Laterality: Left;  . Colonoscopy  2010    normal     Current Outpatient Prescriptions  Medication Sig Dispense Refill  . aspirin 81 MG tablet Take 81 mg by mouth daily.      . clopidogrel (PLAVIX) 75 MG tablet Take 75 mg by mouth daily.      . nitroGLYCERIN (NITROSTAT) 0.4 MG SL  tablet Place 0.4 mg under the tongue every 5 (five) minutes as needed. For chest pain    . pravastatin (PRAVACHOL) 40 MG tablet Take 40 mg by mouth daily.    . valsartan-hydrochlorothiazide (DIOVAN-HCT) 80-12.5 MG per tablet Take 1 tablet by mouth Daily.    . [DISCONTINUED] Valsartan (DIOVAN PO) Take by mouth.      No current facility-administered medications for this visit.    Allergies as of 07/21/2015 - Review Complete 07/21/2015  Allergen Reaction Noted  . Nalfon [fenoprofen calcium] Other (See Comments) 05/17/2011    Vitals: BP 119/68 mmHg  Pulse 53  Ht 5\' 8"  (1.727 m)  Wt 211 lb 9.6 oz (95.981 kg)  BMI 32.18 kg/m2 Last Weight:  Wt Readings from Last 1 Encounters:  07/21/15 211 lb 9.6 oz (95.981  kg)   Last Height:   Ht Readings from Last 1 Encounters:  07/21/15 5\' 8"  (1.727 m)    Neuro: Detailed Neurologic Exam  Speech:  Speech is normal; fluent and spontaneous with normal comprehension.  Cognition:  The patient is oriented to person, place, and time;   recent and remote memory intact;   language fluent;   normal attention, concentration,   fund of knowledge  MMSE - Mini Mental State Exam 07/21/2015  Orientation to time 5  Orientation to Place 5  Registration 3  Attention/ Calculation 5  Recall 1  Language- name 2 objects 2  Language- repeat 1  Language- follow 3 step command 3  Language- read & follow direction 1  Write a sentence 1  Copy design 1  Total score 28   Cranial Nerves:  The pupils are equal, round, and reactive to light. The fundi are normal. Visual fields are full to finger confrontation. Extraocular movements are intact. Trigeminal sensation is intact and the muscles of mastication are normal. The face is symmetric. The palate elevates in the midline. Hearing intact. Voice is normal. Shoulder shrug is normal. The tongue has normal motion without fasciculations.   Coordination:  No dysmetria  Gait:  Heel-toe and  tandem gait are normal.   Motor Observation:  No asymmetry, no atrophy, and no involuntary movements noted. Tone:  Normal muscle tone.   Posture:  Posture is normal. normal erect   Strength:  Strength is V/V in the upper and lower limbs.    Sensation: intact to LT   Reflex Exam:  DTR's:  Deep tendon reflexes in the upper and lower extremities are normal bilaterally.  Toes:  Difficult to assess due to brisk withdrawal but possible right toe upgoing Clonus:  Clonus is absent.    Assessment/Plan: ALEKHYA ZUCCO is a 70 y.o. female here as a follow up for Dizziness. PMHx HLD, CAD, HTN, headache.  Never feels bad sitting or laying down only when standing or walking. If she sits down she feels better or feels better after she has moved around. She doesn't drink a lot of water, 2-3 glasses of fluid max daily.   -She is feeling better today, the dizziness is improved. Trying to hydrate better. Drinking more fluids during the day. Recommend close follow up with primary care for aggressive management of vascular risk factors. Continue aspirin,plavix and statin. Reviewed imaging of the brain, no strokes, white matter changes as discussed. She just had a complete workup by cardiology. Recommend keeping hydrated, keeping systolic above 123456.   - She is having memory problems. MMSE 28/30. Likely normal cognitive aging but asked her to return with husband for follow up in 6 months. TSH checked, recommend B12 at her convenience.  - Patient feels better with increased hydration, continue   Leslie Trombetta(Toni) Jaynee Eagles, MD  [facility] [address] [place], [place] [address]  Phone [phone] Fax [phone]  A total of 50minutes was spent face-to-face with this patient. Over half this time was spent on counseling patient on the dizziness, perceived memory changes diagnosis and different diagnostic and therapeutic options available.

## 2015-07-21 NOTE — Patient Instructions (Signed)
Remember to drink plenty of fluid, eat healthy meals and do not skip any meals. Try to eat protein with a every meal and eat a healthy snack such as fruit or nuts in between meals. Try to keep a regular sleep-wake schedule and try to exercise daily, particularly in the form of walking, 20-30 minutes a day, if you can.   Come back as needed  Our phone number is [phone]. We also have an after hours call service for urgent matters and there is a physician on-call for urgent questions. For any emergencies you know to call 911 or go to the nearest emergency room

## 2015-07-27 ENCOUNTER — Telehealth: Payer: Self-pay | Admitting: *Deleted

## 2015-07-27 DIAGNOSIS — I498 Other specified cardiac arrhythmias: Secondary | ICD-10-CM

## 2015-07-27 DIAGNOSIS — I499 Cardiac arrhythmia, unspecified: Secondary | ICD-10-CM

## 2015-07-27 DIAGNOSIS — I493 Ventricular premature depolarization: Secondary | ICD-10-CM

## 2015-07-27 NOTE — Telephone Encounter (Signed)
Notified the pt that per Dr Meda Coffee, her holter monitor showed that she has frequent PVCs and bigeminy.  Informed the pt that per Dr Meda Coffee, she recommends that we repeat another echo, to assess her LVEF.  Informed the pt that per Dr Meda Coffee, no treatment will be necessary, unless she has an abnormal echo. Informed the pt that I will place the order in the system, and send a message to our [facility] schedulers to call her back and arrange this appt.  Pt verbalized understanding and agrees with this plan.

## 2015-07-27 NOTE — Telephone Encounter (Signed)
-----   Message from Dorothy Spark, MD sent at 07/26/2015  3:31 PM EDT ----- She has frequent PVCs and bigeminy, I would repeat her echocardiogram for LVEF. No treatment necessary unless abnormal echo.

## 2015-08-13 ENCOUNTER — Ambulatory Visit (HOSPITAL_COMMUNITY): Payer: Medicare Other | Attending: Cardiovascular Disease

## 2015-08-13 ENCOUNTER — Other Ambulatory Visit: Payer: Self-pay

## 2015-08-13 DIAGNOSIS — I509 Heart failure, unspecified: Secondary | ICD-10-CM | POA: Diagnosis not present

## 2015-08-13 DIAGNOSIS — I351 Nonrheumatic aortic (valve) insufficiency: Secondary | ICD-10-CM | POA: Insufficient documentation

## 2015-08-13 DIAGNOSIS — I499 Cardiac arrhythmia, unspecified: Secondary | ICD-10-CM

## 2015-08-13 DIAGNOSIS — I2510 Atherosclerotic heart disease of native coronary artery without angina pectoris: Secondary | ICD-10-CM | POA: Diagnosis not present

## 2015-08-13 DIAGNOSIS — I493 Ventricular premature depolarization: Secondary | ICD-10-CM | POA: Diagnosis not present

## 2015-08-13 DIAGNOSIS — I498 Other specified cardiac arrhythmias: Secondary | ICD-10-CM

## 2015-08-13 DIAGNOSIS — E785 Hyperlipidemia, unspecified: Secondary | ICD-10-CM | POA: Insufficient documentation

## 2015-08-13 DIAGNOSIS — I110 Hypertensive heart disease with heart failure: Secondary | ICD-10-CM | POA: Insufficient documentation

## 2015-08-13 LAB — ECHOCARDIOGRAM COMPLETE
AO mean calculated velocity dopler: 148 cm/s
AV Area VTI index: 0.7 cm2/m2
AV Area VTI: 1.5 cm2
AV Area mean vel: 1.52 cm2
AV Mean grad: 10 mmHg
AV Peak grad: 21 mmHg
AV VEL mean LVOT/AV: 0.54
AV area mean vel ind: 0.7 cm2/m2
AV peak Index: 0.69
AV pk vel: 229 cm/s
AV vel: 1.52
Ao pk vel: 0.53 m/s
Ao-asc: 35 cm
E decel time: 349 msec
E/e' ratio: 11.62
FS: 37 % (ref 28–44)
IVS/LV PW RATIO, ED: 1.14
LA ID, A-P, ES: 33 mm
LA diam end sys: 33 mm
LA diam index: 1.52 cm/m2
LA vol A4C: 41 ml
LA vol index: 23.4 mL/m2
LA vol: 51 mL
LV E/e' medial: 11.62
LV E/e'average: 11.62
LV PW d: 10.6 mm — AB (ref 0.6–1.1)
LV dias vol index: 33 mL/m2
LV dias vol: 71 mL (ref 46–106)
LV e' LATERAL: 7.9 cm/s
LV sys vol index: 9 mL/m2
LV sys vol: 19 mL (ref 14–42)
LVOT SV: 80 mL
LVOT VTI: 28.1 cm
LVOT area: 2.84 cm2
LVOT diameter: 19 mm
LVOT peak VTI: 0.54 cm
LVOT peak grad rest: 6 mmHg
LVOT peak vel: 121 cm/s
Lateral S' vel: 13.8 cm/s
MV Dec: 349
MV Peak grad: 3 mmHg
MV pk A vel: 87.9 m/s
MV pk E vel: 91.8 m/s
RV sys press: 28 mmHg
Reg peak vel: 251 cm/s
Simpson's disk: 73
Stroke v: 52 ml
TDI e' lateral: 7.9
TDI e' medial: 6.53
TR max vel: 251 cm/s
VTI: 52.5 cm
Valve area index: 0.7
Valve area: 1.52 cm2

## 2015-08-16 ENCOUNTER — Telehealth: Payer: Self-pay | Admitting: Cardiology

## 2015-08-16 NOTE — Telephone Encounter (Signed)
F/u  Pt returning RN phone call- echo results. Please call back and discuss.   

## 2015-08-16 NOTE — Telephone Encounter (Signed)
Informed pt of echo results. Pt verbalized understanding. 

## 2015-12-02 ENCOUNTER — Ambulatory Visit (INDEPENDENT_AMBULATORY_CARE_PROVIDER_SITE_OTHER): Payer: Medicare Other | Admitting: Gastroenterology

## 2015-12-02 ENCOUNTER — Encounter: Payer: Self-pay | Admitting: Gastroenterology

## 2015-12-02 ENCOUNTER — Telehealth: Payer: Self-pay

## 2015-12-02 VITALS — BP 104/62 | HR 56 | Ht 68.0 in | Wt 214.2 lb

## 2015-12-02 DIAGNOSIS — Z7902 Long term (current) use of antithrombotics/antiplatelets: Secondary | ICD-10-CM

## 2015-12-02 DIAGNOSIS — K5900 Constipation, unspecified: Secondary | ICD-10-CM

## 2015-12-02 DIAGNOSIS — Z1211 Encounter for screening for malignant neoplasm of colon: Secondary | ICD-10-CM | POA: Diagnosis not present

## 2015-12-02 DIAGNOSIS — R1013 Epigastric pain: Secondary | ICD-10-CM | POA: Diagnosis not present

## 2015-12-02 MED ORDER — OMEPRAZOLE 40 MG PO CPDR: 40.0000 mg | DELAYED_RELEASE_CAPSULE | Freq: Every day | ORAL | 3 refills | Status: DC

## 2015-12-02 MED ORDER — POLYETHYLENE GLYCOL 3350 17 GM/SCOOP PO POWD
17.0000 g | Freq: Every day | ORAL | 3 refills | Status: DC
Start: 2015-12-02 — End: 2016-09-04

## 2015-12-02 NOTE — Telephone Encounter (Signed)
[name] 19-Oct-1945 [id number]  Dear Dr Nelson/Dr Bensimhon:  We have scheduled the above named patient for a(n) Endoscopy procedure. Our records show that (s)he is on anticoagulation therapy.  Please advise as to whether the patient may come of their therapy of Plavix  5 days prior to their procedure which is scheduled for 12/16/15.  Please route your response to [facility], LPN or fax response to [phone].  Sincerely,    [facility]

## 2015-12-02 NOTE — Progress Notes (Signed)
HPI :  70 y/o female here for a follow up visit, new patient to me. History of hypertension, hyperlipidemia, fibromuscular dysplasia of the intracranial vessels, and previous left internal carotid artery dissection. She also has a history of coronary artery disease. She is status post previous stenting to the LAD and diagonal. Most recently, she had a Promus drug-eluting stent to the second diagonal in December 2008. Echo in May 2017 showing EF 60-65%, grade 2 DD. Holter monitor shows frequent PVCs. Follow up Echo showed EF 60-65%. Nuclear stress test 07/21/15 showed EF 71%, low risk study.   Last colonoscopy in 2010 was normal. She has fairly regular bowel movements, sometimes some slight constipation. No blood in the stools. Mother had colon cancer in at age 60 or 17 per patient. No other family history of colon cancer.   She has had some occasional stomach pains which bother her at night. Epigastric pain, no radiation to her back. She reports this usually bothers her after she eats. She thinks this has been ongoing on for a few months. Pain usually within an hour or so, then goes away on its own. She is eating normally. She reports this bothers her a few times per week. Pain has woken her from sleep at times. No FH of gastric cancer. She denies any antacid use. She has occasional heartburn. No dysphagia. No weight loss, she thinks she is gaining weight. She denies any NSAID use. On plavix and aspirin. CT scan done 10/2013 showed no gallbladder, but patient does not remember having this removed.    Colonoscopy 2010 - normal, no polyps Colonoscopy 2005 - normal, no polyps EGD 1997 - normal exam  Past Medical History:  Diagnosis Date  . Chest pain   . Coronary artery disease   . GERD (gastroesophageal reflux disease)   . Headache(784.0)    hx of  . Heart murmur   . Hyperlipidemia    takes pravachol  . Hypertension   . Internal hemorrhoids without mention of complication   . Neuromuscular  disorder ([place]) 2010   Fibromuscular dysplasia  . Neutropenia, unspecified   . Shortness of breath    with exertion     Past Surgical History:  Procedure Laterality Date  . ABDOMINAL HYSTERECTOMY    . CARDIAC CATHETERIZATION  2008  . Cardiac Stents  2008  . COLONOSCOPY  2010   normal   . gallstones removed    . PARS PLANA VITRECTOMY  06/26/2011   Procedure: PARS PLANA VITRECTOMY WITH 23 GAUGE;  Surgeon: Adonis Brook, MD;  Location: [place];  Service: Ophthalmology;  Laterality: Left;   Family History  Problem Relation Age of Onset  . Heart disease Mother   . Colon cancer Mother   . Heart attack Father   . Breast cancer Sister     x 2  . Anesthesia problems Neg Hx   . Hypotension Neg Hx   . Malignant hyperthermia Neg Hx   . Pseudochol deficiency Neg Hx    Social History  Substance Use Topics  . Smoking status: Never Smoker  . Smokeless tobacco: Never Used  . Alcohol use No   Current Outpatient Prescriptions  Medication Sig Dispense Refill  . aspirin 81 MG tablet Take 81 mg by mouth daily.      . clopidogrel (PLAVIX) 75 MG tablet Take 75 mg by mouth daily.      . nitroGLYCERIN (NITROSTAT) 0.4 MG SL tablet Place 0.4 mg under the tongue every 5 (five) minutes as  needed. For chest pain    . pravastatin (PRAVACHOL) 40 MG tablet Take 40 mg by mouth daily.    . valsartan-hydrochlorothiazide (DIOVAN-HCT) 80-12.5 MG per tablet Take 1 tablet by mouth Daily.     No current facility-administered medications for this visit.    Allergies  Allergen Reactions  . Nalfon [Fenoprofen Calcium] Other (See Comments)    Stopped kidneys from functioning     Review of Systems: All systems reviewed and negative except where noted in HPI.   Lab Results  Component Value Date   WBC 3.1 (L) 07/07/2015   HGB 12.5 07/07/2015   HCT 37.5 07/07/2015   MCV 88.2 07/07/2015   PLT 204 07/07/2015    Lab Results  Component Value Date   CREATININE 1.01 (H) 07/07/2015   BUN 21 07/07/2015   NA  138 07/07/2015   K 3.9 07/07/2015   CL 104 07/07/2015   CO2 25 07/07/2015    Lab Results  Component Value Date   ALT 9 07/07/2015   AST 19 07/07/2015   ALKPHOS 66 07/07/2015   BILITOT 0.9 07/07/2015     Physical Exam: BP 104/62   Pulse (!) 56   Ht 5\' 8"  (1.727 m)   Wt 214 lb 3.2 oz (97.2 kg)   BMI 32.57 kg/m  Constitutional: Pleasant,well-developed, female in no acute distress. HEENT: Normocephalic and atraumatic. Conjunctivae are normal. No scleral icterus. Neck supple.  Cardiovascular: Normal rate, regular rhythm.  Pulmonary/chest: Effort normal and breath sounds normal. No wheezing, rales or rhonchi. Abdominal: Soft, nondistended, nontender. There are no masses palpable. No hepatomegaly. Extremities: no edema Lymphadenopathy: No cervical adenopathy noted. Neurological: Alert and oriented to person place and time. Skin: Skin is warm and dry. No rashes noted. Psychiatric: Normal mood and affect. Behavior is normal.   ASSESSMENT AND PLAN: 70 year old female with past medical history as outlined above, here for assessment of the following issues:  Epigastric pain - few months worth of intermittent epigastric pain, appears postprandial, with nocturnal symptoms. Given her age and that this appears to be a new symptom, recommending an upper endoscopy rule out PUD, mass lesion, gastritis etc. I discussed risks benefits of endoscopy with her and she wants to proceed. In the interim we'll start omeprazole 40 mg once daily. She'll have to hold her Plavix for 5 days prior to the procedure, and we will obtain cardiology approval before doing this. She can continue her aspirin throughout. Further recommendations pending the results.  Colon cancer screening - while she has a first-degree relative with colon cancer, there are diagnosed greater than age 33. In this light, given she's had 2 prior colonoscopies without any polyps, she is next due for colon cancer screening in 2020. Recall  colonoscopy for 2020.  Constipation - mild and not too bothersome. Counseled her on some options, should she wants to try MiraLAX when necessary. If follow-up as needed for this issue  Dixie Cellar, MD [facility] Pager [phone]  CC: [name], MD

## 2015-12-02 NOTE — Patient Instructions (Signed)
If you are age 71 or older, your body mass index should be between 23-30. Your Body mass index is 32.57 kg/m. If this is out of the aforementioned range listed, please consider follow up with your Primary Care Provider.  If you are age 18 or younger, your body mass index should be between 19-25. Your Body mass index is 32.57 kg/m. If this is out of the aformentioned range listed, please consider follow up with your Primary Care Provider.   We have sent the following medications to your pharmacy for you to pick up at your convenience: Omeprazole and Miralax.  You have been scheduled for an endoscopy. Please follow written instructions given to you at your visit today. If you use inhalers (even only as needed), please bring them with you on the day of your procedure. Your physician has requested that you go to www.startemmi.com and enter the access code given to you at your visit today. This web site gives a general overview about your procedure. However, you should still follow specific instructions given to you by our office regarding your preparation for the procedure.  **Please hold your Plavix 5 days prior to your procedure. You may continue taking your Aspirin. We will contact your cardiologist about stopping the Plavix and if there are any changes we will contact you.

## 2015-12-06 ENCOUNTER — Telehealth: Payer: Self-pay

## 2015-12-06 NOTE — Telephone Encounter (Signed)
[name], please let them know that she can d/c Plavix 5 days prior to endoscopy and restart as soon as acceptable from GI standpoint. Thank you, KN

## 2015-12-06 NOTE — Telephone Encounter (Signed)
Will route this clearance per Dr Meda Coffee, to [facility] LPN with [facility], as requested through epic.

## 2015-12-06 NOTE — Telephone Encounter (Signed)
Per Dr Meda Coffee pt may d/c Plavix 5 days prior to endoscopy procedure.

## 2015-12-06 NOTE — Telephone Encounter (Signed)
Tried calling pt at home number to inform that per Dr Meda Coffee she can d/c her Plavix 5 days prior to her endoscopy procedure. There was no answer. Will try back.

## 2015-12-09 NOTE — Telephone Encounter (Signed)
Pt informed to d/c Plavix 5 days prior to procedure.

## 2015-12-15 ENCOUNTER — Telehealth: Payer: Self-pay | Admitting: Gastroenterology

## 2015-12-15 NOTE — Telephone Encounter (Signed)
Patient did not answer her phone, left a vm stating that she can resume her Plavix today according to Dr. Francesca Oman instructions, and since she has rescheduled her colonoscopy to 11/29. Instructed her to call Dr. Francesca Oman office if she has questions or our office.

## 2015-12-15 NOTE — Telephone Encounter (Signed)
Ok thanks. No charge due to this issue.

## 2015-12-15 NOTE — Telephone Encounter (Signed)
Thanks for letting her know

## 2015-12-16 ENCOUNTER — Encounter: Payer: Medicare Other | Admitting: Gastroenterology

## 2016-01-19 ENCOUNTER — Encounter: Payer: Medicare Other | Admitting: Gastroenterology

## 2016-01-26 ENCOUNTER — Encounter: Payer: Self-pay | Admitting: Gastroenterology

## 2016-01-26 ENCOUNTER — Ambulatory Visit (AMBULATORY_SURGERY_CENTER): Payer: Medicare Other | Admitting: Gastroenterology

## 2016-01-26 VITALS — BP 139/73 | HR 44 | Temp 97.5°F | Resp 22 | Ht 68.0 in | Wt 214.0 lb

## 2016-01-26 DIAGNOSIS — K6389 Other specified diseases of intestine: Secondary | ICD-10-CM | POA: Diagnosis not present

## 2016-01-26 DIAGNOSIS — R1013 Epigastric pain: Secondary | ICD-10-CM

## 2016-01-26 DIAGNOSIS — K222 Esophageal obstruction: Secondary | ICD-10-CM

## 2016-01-26 DIAGNOSIS — K2950 Unspecified chronic gastritis without bleeding: Secondary | ICD-10-CM | POA: Diagnosis not present

## 2016-01-26 MED ORDER — SODIUM CHLORIDE 0.9 % IV SOLN: 500.0000 mL | INTRAVENOUS | Status: DC

## 2016-01-26 NOTE — Progress Notes (Signed)
Called to room to assist during endoscopic procedure.  Patient ID and intended procedure confirmed with present staff. Received instructions for my participation in the procedure from the performing physician.  

## 2016-01-26 NOTE — Op Note (Signed)
[place] Patient Name: [name] Procedure Date: 01/26/2016 10:35 AM MRN: [id number] Endoscopist: Remo Lipps P. Armbruster MD, MD Age: 70 Referring MD:  Date of Birth: Aug 12, 1945 Gender: Female Account #: [id number] Procedure:                Upper GI endoscopy Indications:              Epigastric abdominal pain Medicines:                Monitored Anesthesia Care Procedure:                Pre-Anesthesia Assessment:                           - Prior to the procedure, a History and Physical                            was performed, and patient medications and                            allergies were reviewed. The patient's tolerance of                            previous anesthesia was also reviewed. The risks                            and benefits of the procedure and the sedation                            options and risks were discussed with the patient.                            All questions were answered, and informed consent                            was obtained. Prior Anticoagulants: The patient has                            taken Plavix (clopidogrel), last dose was 5 days                            prior to procedure. ASA Grade Assessment: III - A                            patient with severe systemic disease. After                            reviewing the risks and benefits, the patient was                            deemed in satisfactory condition to undergo the                            procedure.  After obtaining informed consent, the endoscope was                            passed under direct vision. Throughout the                            procedure, the patient's blood pressure, pulse, and                            oxygen saturations were monitored continuously. The                            Model GIF-HQ190 [id number]) scope was introduced                            through the mouth, and advanced to the second part                           of duodenum. The upper GI endoscopy was                            accomplished without difficulty. The patient                            tolerated the procedure well. Scope In: Scope Out: Findings:                 Esophagogastric landmarks were identified: the                            Z-line was found at 36 cm, the gastroesophageal                            junction was found at 36 cm and the upper extent of                            the gastric folds was found at 40 cm from the                            incisors.                           A 4 cm hiatal hernia was present.                           One mild benign-appearing, intrinsic stenosis was                            found at the GEJ and was widely patent. No                            intervention performed, the patient denies                            dysphagia.  The exam of the esophagus was otherwise normal.                           The entire examined stomach was normal. Biopsies                            were taken with a cold forceps from the antrum and                            body for Helicobacter pylori testing.                           2 whitish appearing small nodules, suspected to be                            benign lymphangiectasias was present in the second                            portion of the duodenum. Biopsies were taken with a                            cold forceps for histology, ensure no adenomatous                            change.                           The exam of the duodenum was otherwise normal. Complications:            No immediate complications. Estimated blood loss:                            Minimal. Estimated Blood Loss:     Estimated blood loss was minimal. Impression:               - Esophagogastric landmarks identified.                           - 4 cm hiatal hernia.                           - Benign-appearing esophageal  stenosis.                           - Normal stomach. Biopsied to rule out H pylori                           - Duodenal mucosal lymphangiectasia. Recommendation:           - Patient has a contact number available for                            emergencies. The signs and symptoms of potential                            delayed complications were discussed with the  patient. Return to normal activities tomorrow.                            Written discharge instructions were provided to the                            patient.                           - Resume previous diet.                           - Continue present medications.                           - Resume plavix                           - Trial of omeprazole daily to see if this helps                            symptoms if you have not already tried this                           - Await pathology results. Remo Lipps P. Armbruster MD, MD 01/26/2016 10:57:19 AM This report has been signed electronically.

## 2016-01-26 NOTE — Progress Notes (Signed)
A and O x3. Report to RN. Tolerated MAC anesthesia well.Teeth unchanged after procedure. 

## 2016-01-26 NOTE — Patient Instructions (Signed)
BIOPSIES TAKEN TODAY. HIATAL HERNIA SEEN TODAY, HANDOUT GIVEN.  RESUME CURRENT MEDICATIONS. RESUME PLAVIX TODAY. RESULT LETTER IN YOUR MAIL IN 2-3 WEEKS.  DR.ARMBRUSTER WANTS YOU TO TAKE OMEPRAZOLE 1 PILL DAILY.  CALL us WITH ANY QUESTIONS OR CONCERNS. [place] YOU!   YOU HAD AN ENDOSCOPIC PROCEDURE TODAY AT [facility]:   Refer to the procedure report that was given to you for any specific questions about what was found during the examination.  If the procedure report does not answer your questions, please call your gastroenterologist to clarify.  If you requested that your care partner not be given the details of your procedure findings, then the procedure report has been included in a sealed envelope for you to review at your convenience later.  YOU SHOULD EXPECT: Some feelings of bloating in the abdomen. Passage of more gas than usual.  Walking can help get rid of the air that was put into your GI tract during the procedure and reduce the bloating. If you had a lower endoscopy (such as a colonoscopy or flexible sigmoidoscopy) you may notice spotting of blood in your stool or on the toilet paper. If you underwent a bowel prep for your procedure, you may not have a normal bowel movement for a few days.  Please Note:  You might notice some irritation and congestion in your nose or some drainage.  This is from the oxygen used during your procedure.  There is no need for concern and it should clear up in a day or so.  SYMPTOMS TO REPORT IMMEDIATELY:    Following upper endoscopy (EGD)  Vomiting of blood or coffee ground material  New chest pain or pain under the shoulder blades  Painful or persistently difficult swallowing  New shortness of breath  Fever of 100F or higher  Black, tarry-looking stools  For urgent or emergent issues, a gastroenterologist can be reached at any hour by calling [phone].   DIET:  We do recommend a small meal at first, but then you may proceed  to your regular diet.  Drink plenty of fluids but you should avoid alcoholic beverages for 24 hours.  ACTIVITY:  You should plan to take it easy for the rest of today and you should NOT DRIVE or use heavy machinery until tomorrow (because of the sedation medicines used during the test).    FOLLOW UP: Our staff will call the number listed on your records the next business day following your procedure to check on you and address any questions or concerns that you may have regarding the information given to you following your procedure. If we do not reach you, we will leave a message.  However, if you are feeling well and you are not experiencing any problems, there is no need to return our call.  We will assume that you have returned to your regular daily activities without incident.  If any biopsies were taken you will be contacted by phone or by letter within the next 1-3 weeks.  Please call us at [phone] if you have not heard about the biopsies in 3 weeks.    SIGNATURES/CONFIDENTIALITY: You and/or your care partner have signed paperwork which will be entered into your electronic medical record.  These signatures attest to the fact that that the information above on your After Visit Summary has been reviewed and is understood.  Full responsibility of the confidentiality of this discharge information lies with you and/or your care-partner.

## 2016-01-27 ENCOUNTER — Telehealth: Payer: Self-pay | Admitting: *Deleted

## 2016-01-27 ENCOUNTER — Telehealth: Payer: Self-pay | Admitting: Gastroenterology

## 2016-01-27 NOTE — Telephone Encounter (Signed)
Discussed with pt that she had IV fluids yesterday and drank a lot of liquids, could be just getting rid of this fluid. Instructed pt that if this continued she may want to see her PCP as frequent urination can be a sign of a UTI. Pt verbalized understanding.

## 2016-01-27 NOTE — Telephone Encounter (Signed)
  Follow up Call-  Call back number 01/26/2016  Post procedure Call Back phone  # [phone]  Permission to leave phone message Yes  Some recent data might be hidden     Patient questions:  Do you have a fever, pain , or abdominal swelling? No. Pain Score  0 *  Have you tolerated food without any problems? Yes.    Have you been able to return to your normal activities? Yes.    Do you have any questions about your discharge instructions: Diet   No. Medications  No. Follow up visit  No.  Do you have questions or concerns about your Care? No.  Actions: * If pain score is 4 or above: No action needed, pain <4.

## 2016-04-19 ENCOUNTER — Encounter (INDEPENDENT_AMBULATORY_CARE_PROVIDER_SITE_OTHER): Payer: Self-pay

## 2016-04-19 ENCOUNTER — Encounter: Payer: Self-pay | Admitting: Cardiology

## 2016-04-19 ENCOUNTER — Ambulatory Visit (INDEPENDENT_AMBULATORY_CARE_PROVIDER_SITE_OTHER): Payer: Medicare Other | Admitting: Cardiology

## 2016-04-19 VITALS — BP 124/62 | HR 56 | Ht 68.0 in | Wt 217.0 lb

## 2016-04-19 DIAGNOSIS — R0609 Other forms of dyspnea: Secondary | ICD-10-CM | POA: Diagnosis not present

## 2016-04-19 DIAGNOSIS — I2510 Atherosclerotic heart disease of native coronary artery without angina pectoris: Secondary | ICD-10-CM

## 2016-04-19 DIAGNOSIS — R001 Bradycardia, unspecified: Secondary | ICD-10-CM | POA: Diagnosis not present

## 2016-04-19 DIAGNOSIS — R42 Dizziness and giddiness: Secondary | ICD-10-CM | POA: Diagnosis not present

## 2016-04-19 MED ORDER — VALSARTAN-HYDROCHLOROTHIAZIDE 80-12.5 MG PO TABS: 1.0000 | ORAL_TABLET | Freq: Every day | ORAL | 2 refills | Status: DC

## 2016-04-19 NOTE — Patient Instructions (Signed)
Medication Instructions:   STOP TAKING PLAVIX NOW    Testing/Procedures:  Your physician has recommended that you wear a 24 HOUR holter monitor. Holter monitors are medical devices that record the heart's electrical activity. Doctors most often use these monitors to diagnose arrhythmias. Arrhythmias are problems with the speed or rhythm of the heartbeat. The monitor is a small, portable device. You can wear one while you do your normal daily activities. This is usually used to diagnose what is causing palpitations/syncope (passing out).    Follow-Up:  3 MONTHS WITH DR Meda Coffee       If you need a refill on your cardiac medications before your next appointment, please call your pharmacy.

## 2016-04-19 NOTE — Progress Notes (Signed)
Cardiology Office Note    Date:  04/19/2016   ID:  [name], [name] 02-29-1945, MRN [id number]  PCP:  [name], MD  Cardiologist: Dr Haroldine Laws -->  [name], MD   No chief complaint on file.   History of Present Illness:  [name] is a 71 y.o. female   Ms. [name] is a delightful 71 year old woman with a history of hypertension, hyperlipidemia, fibromuscular dysplasia of the intracranial vessels, and previous left internal carotid artery dissection. She also has a history of coronary artery disease. She is status post previous stenting to the LAD and diagonal. Most recently, she had a Promus drug-eluting stent to the second diagonal in December 2008.   Was in cardiac rehab in December 2011 and experienced CP. Took NTG and then had a presyncopal episode with SBP 80. With this developed transient L facial dropp thought to be due to cerebral hypoperfusion. CT/MRI normal. R/o for MI with serial cardiac markers. Lexiscan Myoview 12/11 showed EF 72% with normal perfusion.   Unable to tolerate b-blockers due to bradycardia.   07/07/15 - She returns for f/u, hasn't seen Dr Haroldine Laws in 2 years. She has noticed worsening DOE, no CP. Sh is not very active. She has been also noticing palpitations that start and end of of a sudden, couple times a day some days, the other days not at all, on exersion and at night. Denies syncope. No LE edema, orthopnea, PND. Lipids not checked in a while. On Pravastatin. No side effects.  04/18/2016 - 1 year follow-up, at the last year visit the patient complained of dyspnea on exertion and underwent nuclear stress testing that was negative for prior infarct or ischemia and showed normal LVEF. She also underwent Holter monitoring for palpitations that showed frequent PVCs and bigeminy and sinus bradycardia almost the entire monitoring time list. The patient underwent repeat echocardiogram that showed normal LVEF grade 1 diastolic dysfunction and only  trivial aortic regurgitation. Today she states that she continues to feel exertional dyspnea on moderate exertion however she doesn't exercise or walk with a regular basis. She denies any lower extremity edema orthopnea or proximal nocturnal dyspnea but states that she has been progressively more dizzy with no presyncope or syncope. She is also complaining of blood-tinged sputum that she experiences every morning.   Past Medical History:  Diagnosis Date  . Chest pain   . Coronary artery disease   . GERD (gastroesophageal reflux disease)   . Headache(784.0)    hx of  . Heart murmur   . Hyperlipidemia    takes pravachol  . Hypertension   . Internal hemorrhoids without mention of complication   . Neuromuscular disorder ([place]) 2010   Fibromuscular dysplasia  . Neutropenia, unspecified   . Shortness of breath    with exertion    Past Surgical History:  Procedure Laterality Date  . ABDOMINAL HYSTERECTOMY    . CARDIAC CATHETERIZATION  2008  . Cardiac Stents  2008  . COLONOSCOPY  2010   normal   . gallstones removed    . PARS PLANA VITRECTOMY  06/26/2011   Procedure: PARS PLANA VITRECTOMY WITH 23 GAUGE;  Surgeon: Adonis Brook, MD;  Location: [facility];  Service: Ophthalmology;  Laterality: Left;    Current Medications: Outpatient Medications Prior to Visit  Medication Sig Dispense Refill  . aspirin 81 MG tablet Take 81 mg by mouth daily.      . nitroGLYCERIN (NITROSTAT) 0.4 MG SL tablet Place 0.4 mg under the  tongue every 5 (five) minutes as needed. For chest pain    . omeprazole (PRILOSEC) 40 MG capsule Take 1 capsule (40 mg total) by mouth daily. 30 capsule 3  . pravastatin (PRAVACHOL) 40 MG tablet Take 40 mg by mouth daily.    . clopidogrel (PLAVIX) 75 MG tablet Take 75 mg by mouth daily.      . valsartan-hydrochlorothiazide (DIOVAN-HCT) 80-12.5 MG per tablet Take 1 tablet by mouth Daily.    . polyethylene glycol powder (GLYCOLAX/MIRALAX) powder Take 17 g by mouth daily. (Patient not  taking: Reported on 04/19/2016) 255 g 3   Facility-Administered Medications Prior to Visit  Medication Dose Route Frequency Provider Last Rate Last Dose  . 0.9 %  sodium chloride infusion  500 mL Intravenous Continuous Manus Gunning, MD         Allergies:   Nalfon [fenoprofen calcium]   Social History   Social History  . Marital status: Married    Spouse name: [name]  . Number of children: 1  . Years of education: 17   Occupational History  . Retired    Social History Main Topics  . Smoking status: Never Smoker  . Smokeless tobacco: Never Used  . Alcohol use No  . Drug use: No  . Sexual activity: Yes    Birth control/ protection: Surgical   Other Topics Concern  . None   Social History Narrative   Lives with husband   Caffeine use: Tea rare     Family History:  The patient's family history includes Breast cancer in her sister; Colon cancer in her mother; Heart attack in her father; Heart disease in her mother.   ROS:   Please see the history of present illness.    ROS All other systems reviewed and are negative.   PHYSICAL EXAM:   VS:  BP 124/62   Pulse (!) 56   Ht 5\' 8"  (1.727 m)   Wt 217 lb (98.4 kg)   BMI 32.99 kg/m    GEN: Well nourished, well developed, in no acute distress HEENT: normal Neck: no JVD, carotid bruits, or masses Cardiac: RRR; 2/6 DIAST murmurs, rubs, or gallops,no edema  Respiratory:  clear to auscultation bilaterally, normal work of breathing GI: soft, nontender, nondistended, + BS MS: no deformity or atrophy Skin: warm and dry, no rash Neuro:  Alert and Oriented x 3, Strength and sensation are intact Psych: euthymic mood, full affect  Wt Readings from Last 3 Encounters:  04/19/16 217 lb (98.4 kg)  01/26/16 214 lb (97.1 kg)  12/02/15 214 lb 3.2 oz (97.2 kg)      Studies/Labs Reviewed:   EKG:  SB, normal ECG  Recent Labs: 07/07/2015: ALT 9; BUN 21; Creat 1.01; Hemoglobin 12.5; Platelets 204; Potassium 3.9; Sodium 138;  TSH 1.88   Lipid Panel    Component Value Date/Time   CHOL 151 07/07/2015 0913   TRIG 65 07/07/2015 0913   HDL 65 07/07/2015 0913   CHOLHDL 2.3 07/07/2015 0913   VLDL 13 07/07/2015 0913   LDLCALC 73 07/07/2015 0913    Additional studies/ records that were reviewed today include:   TTE: 06/2015 Left ventricle: The cavity size was normal. There was mild focal   basal hypertrophy of the septum. Systolic function was normal.   The estimated ejection fraction was in the range of 60% to 65%.   Wall motion was normal; there were no regional wall motion   abnormalities. Left ventricular diastolic function parameters   were normal. -  Aortic valve: Calcified non coronary cusp. There was trivial   regurgitation..  Nuclear stress test: 06/2015  Nuclear stress EF: 71%.  There was 48mm of horizontal ST segment depression in the lateral precordial leads that became downsloping in recovery. Patient had submaximal exercise treadmill test and was change to a Lexiscan.  The nuclear images are normal.  This is a low risk study.  The left ventricular ejection fraction is hyperdynamic (>65%).   EKG today is no reviewed and shows sinus bradycardia 56 bpm, nonspecific T-wave abnormalities unchanged from prior.   ASSESSMENT:    1. DOE (dyspnea on exertion)   2. Dizziness   3. Atherosclerosis of native coronary artery of native heart without angina pectoris   4. Bradycardia      PLAN:  In order of problems listed above:  1. CAD - Stable DOE - Negative stress test year ago was probably secondary to deconditioning. Continue current regimen. Given first generation DES in LAD would continue Plavix indefinitely. He goes of ongoing mild bleeding we'll discontinue aspirin. Lipids at goal year ago, they were just checked by her primary care physician with LDL 70, HDL 57 triglycerides 57. Normal liver function test.   2. HTN - Blood pressure well controlled. Continue current regimen.  3. HLP - as  above  4. Dizziness - persistent sinus bradycardia with no pauses year ago, but the worsening dizziness we'll repeat 24-hour Holter monitor.  Medication Adjustments/Labs and Tests Ordered: Current medicines are reviewed at length with the patient today.  Concerns regarding medicines are outlined above.  Medication changes, Labs and Tests ordered today are listed in the Patient Instructions below. Patient Instructions  Medication Instructions:   STOP TAKING PLAVIX NOW    Testing/Procedures:  Your physician has recommended that you wear a 24 HOUR holter monitor. Holter monitors are medical devices that record the heart's electrical activity. Doctors most often use these monitors to diagnose arrhythmias. Arrhythmias are problems with the speed or rhythm of the heartbeat. The monitor is a small, portable device. You can wear one while you do your normal daily activities. This is usually used to diagnose what is causing palpitations/syncope (passing out).    Follow-Up:  3 MONTHS WITH DR Meda Coffee       If you need a refill on your cardiac medications before your next appointment, please call your pharmacy.      Signed, [name], MD  04/19/2016 10:13 AM    [facility] [address], [place] [place], [place]  [address] Phone: [phone]; Fax: [phone]

## 2016-04-25 ENCOUNTER — Ambulatory Visit (INDEPENDENT_AMBULATORY_CARE_PROVIDER_SITE_OTHER): Payer: Medicare Other

## 2016-04-25 DIAGNOSIS — I2510 Atherosclerotic heart disease of native coronary artery without angina pectoris: Secondary | ICD-10-CM

## 2016-04-25 DIAGNOSIS — R001 Bradycardia, unspecified: Secondary | ICD-10-CM | POA: Diagnosis not present

## 2016-04-25 DIAGNOSIS — R42 Dizziness and giddiness: Secondary | ICD-10-CM | POA: Diagnosis not present

## 2016-04-25 DIAGNOSIS — R0609 Other forms of dyspnea: Secondary | ICD-10-CM | POA: Diagnosis not present

## 2016-05-10 ENCOUNTER — Telehealth: Payer: Self-pay | Admitting: *Deleted

## 2016-05-10 MED ORDER — CLOPIDOGREL BISULFATE 75 MG PO TABS: 75.0000 mg | ORAL_TABLET | Freq: Every day | ORAL | 3 refills | Status: DC

## 2016-05-10 NOTE — Telephone Encounter (Signed)
-----   Message from Dorothy Spark, MD sent at 05/09/2016  5:06 PM EDT ----- She needs to be on Plavix, discontinue aspirin.

## 2016-05-10 NOTE — Telephone Encounter (Signed)
Notified the pt that per Dr Meda Coffee, we will discontinue her aspirin and she needs to resume back taking only Plavix 75 mg po daily.  Pt states she has enough plavix on hand at this time.  Sent a new script for plavix to the pts pharmacy, with a note to pharmacy to hold until pt request further refills. Pt verbalized understanding and agrees with this plan.

## 2016-06-16 ENCOUNTER — Other Ambulatory Visit: Payer: Self-pay | Admitting: Gastroenterology

## 2016-06-27 ENCOUNTER — Encounter: Payer: Self-pay | Admitting: Cardiology

## 2016-07-03 ENCOUNTER — Encounter: Payer: Self-pay | Admitting: Cardiology

## 2016-07-14 ENCOUNTER — Ambulatory Visit: Payer: Medicare Other | Admitting: Cardiology

## 2016-07-19 ENCOUNTER — Encounter: Payer: Self-pay | Admitting: Cardiology

## 2016-07-31 ENCOUNTER — Ambulatory Visit: Payer: Medicare Other | Admitting: Cardiology

## 2016-08-09 ENCOUNTER — Encounter: Payer: Self-pay | Admitting: Cardiology

## 2016-09-04 ENCOUNTER — Encounter (INDEPENDENT_AMBULATORY_CARE_PROVIDER_SITE_OTHER): Payer: Self-pay

## 2016-09-04 ENCOUNTER — Ambulatory Visit (INDEPENDENT_AMBULATORY_CARE_PROVIDER_SITE_OTHER): Payer: Medicare Other | Admitting: Physician Assistant

## 2016-09-04 ENCOUNTER — Encounter: Payer: Self-pay | Admitting: Physician Assistant

## 2016-09-04 VITALS — BP 110/70 | HR 54 | Ht 68.0 in | Wt 213.0 lb

## 2016-09-04 DIAGNOSIS — I2510 Atherosclerotic heart disease of native coronary artery without angina pectoris: Secondary | ICD-10-CM

## 2016-09-04 DIAGNOSIS — R002 Palpitations: Secondary | ICD-10-CM | POA: Diagnosis not present

## 2016-09-04 DIAGNOSIS — R0989 Other specified symptoms and signs involving the circulatory and respiratory systems: Secondary | ICD-10-CM | POA: Diagnosis not present

## 2016-09-04 DIAGNOSIS — R600 Localized edema: Secondary | ICD-10-CM | POA: Insufficient documentation

## 2016-09-04 DIAGNOSIS — I10 Essential (primary) hypertension: Secondary | ICD-10-CM | POA: Diagnosis not present

## 2016-09-04 NOTE — Patient Instructions (Addendum)
Medication Instructions:  Your physician recommends that you continue on your current medications as directed. Please refer to the Current Medication list given to you today.   Labwork: NONE ORDERED TODAY  Testing/Procedures: 1. Your physician has requested that you have a carotid duplex. This test is an ultrasound of the carotid arteries in your neck. It looks at blood flow through these arteries that supply the brain with blood. Allow one hour for this exam. There are no restrictions or special instructions.    Follow-Up: DR. Meda Coffee 3-4 MONTHS   Any Other Special Instructions Will Be Listed Below (If Applicable). LIMIT YOUR AMOUNT OF SWEET TEA  If you need a refill on your cardiac medications before your next appointment, please call your pharmacy.   Low-Sodium Eating Plan Sodium, which is an element that makes up salt, helps you maintain a healthy balance of fluids in your body. Too much sodium can increase your blood pressure and cause fluid and waste to be held in your body. Your health care provider or dietitian may recommend following this plan if you have high blood pressure (hypertension), kidney disease, liver disease, or heart failure. Eating less sodium can help lower your blood pressure, reduce swelling, and protect your heart, liver, and kidneys. What are tips for following this plan? General guidelines  Most people on this plan should limit their sodium intake to 1,500-2,000 mg (milligrams) of sodium each day. Reading food labels  The Nutrition Facts label lists the amount of sodium in one serving of the food. If you eat more than one serving, you must multiply the listed amount of sodium by the number of servings.  Choose foods with less than 140 mg of sodium per serving.  Avoid foods with 300 mg of sodium or more per serving. Shopping  Look for lower-sodium products, often labeled as "low-sodium" or "no salt added."  Always check the sodium content even if  foods are labeled as "unsalted" or "no salt added".  Buy fresh foods. ? Avoid canned foods and premade or frozen meals. ? Avoid canned, cured, or processed meats  Buy breads that have less than 80 mg of sodium per slice. Cooking  Eat more home-cooked food and less restaurant, buffet, and fast food.  Avoid adding salt when cooking. Use salt-free seasonings or herbs instead of table salt or sea salt. Check with your health care provider or pharmacist before using salt substitutes.  Cook with plant-based oils, such as canola, sunflower, or olive oil. Meal planning  When eating at a restaurant, ask that your food be prepared with less salt or no salt, if possible.  Avoid foods that contain MSG (monosodium glutamate). MSG is sometimes added to [place] food, bouillon, and some canned foods. What foods are recommended? The items listed may not be a complete list. Talk with your dietitian about what dietary choices are best for you. Grains Low-sodium cereals, including oats, puffed wheat and rice, and shredded wheat. Low-sodium crackers. Unsalted rice. Unsalted pasta. Low-sodium bread. Whole-grain breads and whole-grain pasta. Vegetables Fresh or frozen vegetables. "No salt added" canned vegetables. "No salt added" tomato sauce and paste. Low-sodium or reduced-sodium tomato and vegetable juice. Fruits Fresh, frozen, or canned fruit. Fruit juice. Meats and other protein foods Fresh or frozen (no salt added) meat, poultry, seafood, and fish. Low-sodium canned tuna and salmon. Unsalted nuts. Dried peas, beans, and lentils without added salt. Unsalted canned beans. Eggs. Unsalted nut butters. Dairy Milk. Soy milk. Cheese that is naturally low in sodium, such  as ricotta cheese, fresh mozzarella, or Swiss cheese Low-sodium or reduced-sodium cheese. Cream cheese. Yogurt. Fats and oils Unsalted butter. Unsalted margarine with no trans fat. Vegetable oils such as canola or olive oils. Seasonings and  other foods Fresh and dried herbs and spices. Salt-free seasonings. Low-sodium mustard and ketchup. Sodium-free salad dressing. Sodium-free light mayonnaise. Fresh or refrigerated horseradish. Lemon juice. Vinegar. Homemade, reduced-sodium, or low-sodium soups. Unsalted popcorn and pretzels. Low-salt or salt-free chips. What foods are not recommended? The items listed may not be a complete list. Talk with your dietitian about what dietary choices are best for you. Grains Instant hot cereals. Bread stuffing, pancake, and biscuit mixes. Croutons. Seasoned rice or pasta mixes. Noodle soup cups. Boxed or frozen macaroni and cheese. Regular salted crackers. Self-rising flour. Vegetables Sauerkraut, pickled vegetables, and relishes. Olives. [place] fries. Onion rings. Regular canned vegetables (not low-sodium or reduced-sodium). Regular canned tomato sauce and paste (not low-sodium or reduced-sodium). Regular tomato and vegetable juice (not low-sodium or reduced-sodium). Frozen vegetables in sauces. Meats and other protein foods Meat or fish that is salted, canned, smoked, spiced, or pickled. Bacon, ham, sausage, hotdogs, corned beef, chipped beef, packaged lunch meats, salt pork, jerky, pickled herring, anchovies, regular canned tuna, sardines, salted nuts. Dairy Processed cheese and cheese spreads. Cheese curds. Blue cheese. Feta cheese. String cheese. Regular cottage cheese. Buttermilk. Canned milk. Fats and oils Salted butter. Regular margarine. Ghee. Bacon fat. Seasonings and other foods Onion salt, garlic salt, seasoned salt, table salt, and sea salt. Canned and packaged gravies. Worcestershire sauce. Tartar sauce. Barbecue sauce. Teriyaki sauce. Soy sauce, including reduced-sodium. Steak sauce. Fish sauce. Oyster sauce. Cocktail sauce. Horseradish that you find on the shelf. Regular ketchup and mustard. Meat flavorings and tenderizers. Bouillon cubes. Hot sauce and Tabasco sauce. Premade or packaged  marinades. Premade or packaged taco seasonings. Relishes. Regular salad dressings. Salsa. Potato and tortilla chips. Corn chips and puffs. Salted popcorn and pretzels. Canned or dried soups. Pizza. Frozen entrees and pot pies. Summary  Eating less sodium can help lower your blood pressure, reduce swelling, and protect your heart, liver, and kidneys.  Most people on this plan should limit their sodium intake to 1,500-2,000 mg (milligrams) of sodium each day.  Canned, boxed, and frozen foods are high in sodium. Restaurant foods, fast foods, and pizza are also very high in sodium. You also get sodium by adding salt to food.  Try to cook at home, eat more fresh fruits and vegetables, and eat less fast food, canned, processed, or prepared foods. This information is not intended to replace advice given to you by your health care provider. Make sure you discuss any questions you have with your health care provider. Document Released: 07/29/2001 Document Revised: 01/31/2016 Document Reviewed: 01/31/2016 Elsevier Interactive Patient Education  2017 [employer].

## 2016-09-04 NOTE — Progress Notes (Signed)
Cardiology Office Note    Date:  09/04/2016   ID:  Miles, Borkowski Jan 07, 1946, MRN [id number]  PCP:  [name], MD  Cardiologist: Dr. Meda Coffee  No chief complaint on file.   History of Present Illness:  [name] is a 71 y.o. female with history of CAD status post stenting of the LAD and diagonal 2 and PTCA for early in stent restenosis to diagonal 2 in 2008. Palpitations in 2017 with Holter showing frequent PVCs and bigeminy and sinus bradycardia almost entire monitoring time. Echo showed normal LVEF and grade 1 DD with trivial AI. Nuclear stress test 06/2015 low risk study, no ischemia LVEF 71%. Patient also has hypertension, HLD and history of dizziness. Repeat 24-hour Holter 04/2016 showed sinus bradycardia to sinus rhythm frequent PACs, and frequent PVCs, no AV blocks or pauses. Recommended Plavix long term and aspirin stopped.  Patient comes in today for follow-up. She still has occasional palpitations but not daily. She notices it most when she lays down at night and only last a few seconds. She has no associated chest pain, shortness of breath or dizziness. She denies any chest pain or pressure. She does not get any regular exercise. She has had some leg swelling recently and admits to eating out daily. She also drinks sweet tea in the evening which could contribute to her palpitations. She's had some bilateral calf pain but is seeing her primary care today for this.    Past Medical History:  Diagnosis Date  . Chest pain   . Coronary artery disease   . GERD (gastroesophageal reflux disease)   . Headache(784.0)    hx of  . Heart murmur   . Hyperlipidemia    takes pravachol  . Hypertension   . Internal hemorrhoids without mention of complication   . Neuromuscular disorder ([place]) 2010   Fibromuscular dysplasia  . Neutropenia, unspecified ([place])   . Shortness of breath    with exertion    Past Surgical History:  Procedure Laterality Date  . ABDOMINAL HYSTERECTOMY      . CARDIAC CATHETERIZATION  2008  . Cardiac Stents  2008  . COLONOSCOPY  2010   normal   . gallstones removed    . PARS PLANA VITRECTOMY  06/26/2011   Procedure: PARS PLANA VITRECTOMY WITH 23 GAUGE;  Surgeon: Adonis Brook, MD;  Location: [facility];  Service: Ophthalmology;  Laterality: Left;    Current Medications: Current Meds  Medication Sig  . clopidogrel (PLAVIX) 75 MG tablet Take 1 tablet (75 mg total) by mouth daily.  . nitroGLYCERIN (NITROSTAT) 0.4 MG SL tablet Place 0.4 mg under the tongue every 5 (five) minutes as needed. For chest pain  . omeprazole (PRILOSEC) 40 MG capsule TAKE 1 CAPSULE (40 MG TOTAL) BY MOUTH DAILY.  . pravastatin (PRAVACHOL) 40 MG tablet Take 40 mg by mouth daily.  . valsartan-hydrochlorothiazide (DIOVAN-HCT) 80-12.5 MG tablet Take 1 tablet by mouth daily.  . [DISCONTINUED] polyethylene glycol powder (GLYCOLAX/MIRALAX) powder Take 17 g by mouth daily.   Current Facility-Administered Medications for the 09/04/16 encounter (Office Visit) with [name], PA-C  Medication  . 0.9 %  sodium chloride infusion     Allergies:   Nalfon [fenoprofen calcium]   Social History   Social History  . Marital status: Married    Spouse name: [name]  . Number of children: 1  . Years of education: 17   Occupational History  . Retired    Social History Main Topics  .  Smoking status: Never Smoker  . Smokeless tobacco: Never Used  . Alcohol use No  . Drug use: No  . Sexual activity: Yes    Birth control/ protection: Surgical   Other Topics Concern  . None   Social History Narrative   Lives with husband   Caffeine use: Tea rare     Family History:  The patient's family history includes Breast cancer in her sister; Colon cancer in her mother; Heart attack in her father; Heart disease in her mother.   ROS:   Please see the history of present illness.    Review of Systems  Constitution: Negative.  HENT: Negative.   Eyes: Negative.   Cardiovascular:  Positive for leg swelling and palpitations.  Respiratory: Negative.   Hematologic/Lymphatic: Negative.   Musculoskeletal: Positive for joint pain.  Gastrointestinal: Negative.   Genitourinary: Negative.   Neurological: Negative.    All other systems reviewed and are negative.   PHYSICAL EXAM:   VS:  BP 110/70   Pulse (!) 54   Ht 5\' 8"  (1.727 m)   Wt 213 lb (96.6 kg)   SpO2 99%   BMI 32.39 kg/m   Physical Exam  GEN: Well nourished, well developed, in no acute distress  Neck: Bilateral carotid bruits right greater than left no JVD, or masses Cardiac:RRR; 1/6 diastolic murmur at the left sternal border  Respiratory:  clear to auscultation bilaterally, normal work of breathing GI: soft, nontender, nondistended, + BS Ext: Trace of bilateral ankle edema right greater than left, complains of pain when palpating her legs. Otherwise without cyanosis, clubbing, or edema, Good distal pulses bilaterally MS: no deformity or atrophy  Skin: warm and dry, no rash Neuro:  Alert and Oriented x 3 Psych: euthymic mood, full affect  Wt Readings from Last 3 Encounters:  09/04/16 213 lb (96.6 kg)  04/19/16 217 lb (98.4 kg)  01/26/16 214 lb (97.1 kg)      Studies/Labs Reviewed:   EKG:  EKG is notordered today.    Recent Labs: No results found for requested labs within last 8760 hours.   Lipid Panel    Component Value Date/Time   CHOL 151 07/07/2015 0913   TRIG 65 07/07/2015 0913   HDL 65 07/07/2015 0913   CHOLHDL 2.3 07/07/2015 0913   VLDL 13 07/07/2015 0913   LDLCALC 73 07/07/2015 0913    Additional studies/ records that were reviewed today include:   Echo 6/23/17Study Conclusions   - Left ventricle: The cavity size was normal. There was mild focal   basal hypertrophy of the septum. Systolic function was normal.   The estimated ejection fraction was in the range of 60% to 65%.   Wall motion was normal; there were no regional wall motion   abnormalities. Left ventricular  diastolic function parameters   were normal. - Aortic valve: Calcified non coronary cusp. There was trivial   regurgitation.   Nuclear stress chest 5/31/17Study Highlights   Nuclear stress EF: 71%.  There was 15mm of horizontal ST segment depression in the lateral precordial leads that became downsloping in recovery. Patient had submaximal exercise treadmill test and was change to a Lexiscan.  The nuclear images are normal.  This is a low risk study.  The left ventricular ejection fraction is hyperdynamic (>65%).        ASSESSMENT:    1. Atherosclerosis of native coronary artery of native heart without angina pectoris   2. Bilateral carotid bruits   3. Essential hypertension, benign  4. Palpitations   5. Leg edema      PLAN:  In order of problems listed above:  CAD with prior stenting LAD and diagonal 2 with subsequent PTCA of the diagonal 2 for in-stent restenosis 2008  Bilateral carotid bruits right greater than left. We'll order carotid Dopplers  Essential hypertension blood pressure controlled  Palpitations are short lived and no significant arrhythmias were found on Holter monitor 04/2016. Recommend decreasing sweet tea intake.  Leg edema patient eats out a lot and I suspect is getting extra salt in her diet. She has pain on palpation of her legs. She did have a long train ride to [place] month ago but she has pain all over her legs even her anterior tibia area. She is seeing primary care for this today. I do not suspect that she's has a DVT. 2 g sodium diet.    Medication Adjustments/Labs and Tests Ordered: Current medicines are reviewed at length with the patient today.  Concerns regarding medicines are outlined above.  Medication changes, Labs and Tests ordered today are listed in the Patient Instructions below. Patient Instructions  Medication Instructions:  Your physician recommends that you continue on your current medications as directed. Please refer to  the Current Medication list given to you today.   Labwork: NONE ORDERED TODAY  Testing/Procedures: 1. Your physician has requested that you have a carotid duplex. This test is an ultrasound of the carotid arteries in your neck. It looks at blood flow through these arteries that supply the brain with blood. Allow one hour for this exam. There are no restrictions or special instructions.    Follow-Up: DR. Meda Coffee 3-4 MONTHS   Any Other Special Instructions Will Be Listed Below (If Applicable). LIMIT YOUR AMOUNT OF SWEET TEA  If you need a refill on your cardiac medications before your next appointment, please call your pharmacy.   Low-Sodium Eating Plan Sodium, which is an element that makes up salt, helps you maintain a healthy balance of fluids in your body. Too much sodium can increase your blood pressure and cause fluid and waste to be held in your body. Your health care provider or dietitian may recommend following this plan if you have high blood pressure (hypertension), kidney disease, liver disease, or heart failure. Eating less sodium can help lower your blood pressure, reduce swelling, and protect your heart, liver, and kidneys. What are tips for following this plan? General guidelines  Most people on this plan should limit their sodium intake to 1,500-2,000 mg (milligrams) of sodium each day. Reading food labels  The Nutrition Facts label lists the amount of sodium in one serving of the food. If you eat more than one serving, you must multiply the listed amount of sodium by the number of servings.  Choose foods with less than 140 mg of sodium per serving.  Avoid foods with 300 mg of sodium or more per serving. Shopping  Look for lower-sodium products, often labeled as "low-sodium" or "no salt added."  Always check the sodium content even if foods are labeled as "unsalted" or "no salt added".  Buy fresh foods. ? Avoid canned foods and premade or frozen meals. ? Avoid  canned, cured, or processed meats  Buy breads that have less than 80 mg of sodium per slice. Cooking  Eat more home-cooked food and less restaurant, buffet, and fast food.  Avoid adding salt when cooking. Use salt-free seasonings or herbs instead of table salt or sea salt. Check with your health care  provider or pharmacist before using salt substitutes.  Cook with plant-based oils, such as canola, sunflower, or olive oil. Meal planning  When eating at a restaurant, ask that your food be prepared with less salt or no salt, if possible.  Avoid foods that contain MSG (monosodium glutamate). MSG is sometimes added to [place] food, bouillon, and some canned foods. What foods are recommended? The items listed may not be a complete list. Talk with your dietitian about what dietary choices are best for you. Grains Low-sodium cereals, including oats, puffed wheat and rice, and shredded wheat. Low-sodium crackers. Unsalted rice. Unsalted pasta. Low-sodium bread. Whole-grain breads and whole-grain pasta. Vegetables Fresh or frozen vegetables. "No salt added" canned vegetables. "No salt added" tomato sauce and paste. Low-sodium or reduced-sodium tomato and vegetable juice. Fruits Fresh, frozen, or canned fruit. Fruit juice. Meats and other protein foods Fresh or frozen (no salt added) meat, poultry, seafood, and fish. Low-sodium canned tuna and salmon. Unsalted nuts. Dried peas, beans, and lentils without added salt. Unsalted canned beans. Eggs. Unsalted nut butters. Dairy Milk. Soy milk. Cheese that is naturally low in sodium, such as ricotta cheese, fresh mozzarella, or Swiss cheese Low-sodium or reduced-sodium cheese. Cream cheese. Yogurt. Fats and oils Unsalted butter. Unsalted margarine with no trans fat. Vegetable oils such as canola or olive oils. Seasonings and other foods Fresh and dried herbs and spices. Salt-free seasonings. Low-sodium mustard and ketchup. Sodium-free salad dressing.  Sodium-free light mayonnaise. Fresh or refrigerated horseradish. Lemon juice. Vinegar. Homemade, reduced-sodium, or low-sodium soups. Unsalted popcorn and pretzels. Low-salt or salt-free chips. What foods are not recommended? The items listed may not be a complete list. Talk with your dietitian about what dietary choices are best for you. Grains Instant hot cereals. Bread stuffing, pancake, and biscuit mixes. Croutons. Seasoned rice or pasta mixes. Noodle soup cups. Boxed or frozen macaroni and cheese. Regular salted crackers. Self-rising flour. Vegetables Sauerkraut, pickled vegetables, and relishes. Olives. [place] fries. Onion rings. Regular canned vegetables (not low-sodium or reduced-sodium). Regular canned tomato sauce and paste (not low-sodium or reduced-sodium). Regular tomato and vegetable juice (not low-sodium or reduced-sodium). Frozen vegetables in sauces. Meats and other protein foods Meat or fish that is salted, canned, smoked, spiced, or pickled. Bacon, ham, sausage, hotdogs, corned beef, chipped beef, packaged lunch meats, salt pork, jerky, pickled herring, anchovies, regular canned tuna, sardines, salted nuts. Dairy Processed cheese and cheese spreads. Cheese curds. Blue cheese. Feta cheese. String cheese. Regular cottage cheese. Buttermilk. Canned milk. Fats and oils Salted butter. Regular margarine. Ghee. Bacon fat. Seasonings and other foods Onion salt, garlic salt, seasoned salt, table salt, and sea salt. Canned and packaged gravies. Worcestershire sauce. Tartar sauce. Barbecue sauce. Teriyaki sauce. Soy sauce, including reduced-sodium. Steak sauce. Fish sauce. Oyster sauce. Cocktail sauce. Horseradish that you find on the shelf. Regular ketchup and mustard. Meat flavorings and tenderizers. Bouillon cubes. Hot sauce and Tabasco sauce. Premade or packaged marinades. Premade or packaged taco seasonings. Relishes. Regular salad dressings. Salsa. Potato and tortilla chips. Corn chips  and puffs. Salted popcorn and pretzels. Canned or dried soups. Pizza. Frozen entrees and pot pies. Summary  Eating less sodium can help lower your blood pressure, reduce swelling, and protect your heart, liver, and kidneys.  Most people on this plan should limit their sodium intake to 1,500-2,000 mg (milligrams) of sodium each day.  Canned, boxed, and frozen foods are high in sodium. Restaurant foods, fast foods, and pizza are also very high in sodium. You also get sodium by  adding salt to food.  Try to cook at home, eat more fresh fruits and vegetables, and eat less fast food, canned, processed, or prepared foods. This information is not intended to replace advice given to you by your health care provider. Make sure you discuss any questions you have with your health care provider. Document Released: 07/29/2001 Document Revised: 01/31/2016 Document Reviewed: 01/31/2016 Elsevier Interactive Patient Education  2017 [name], [name], [place]  09/04/2016 11:49 AM    [facility] [address], [place], [place]  [address] Phone: [phone]; Fax: [phone]

## 2016-09-05 ENCOUNTER — Telehealth: Payer: Self-pay | Admitting: Physician Assistant

## 2016-09-05 ENCOUNTER — Other Ambulatory Visit: Payer: Self-pay | Admitting: Internal Medicine

## 2016-09-05 DIAGNOSIS — M7989 Other specified soft tissue disorders: Secondary | ICD-10-CM

## 2016-09-05 NOTE — Telephone Encounter (Signed)
Returned pts call and she states that she was talking to Ermalinda Barrios, PA-C about her fee/legs swelling.  She said she saw her PCP today and states that she spoke with him and he advised her to call you back and let you know that it was a circulation problem.  Please advise!

## 2016-09-05 NOTE — Telephone Encounter (Signed)
Pt has been made per Leslie Barrios, PA-C, that she didn't believe that her leg pain/swelling was re: PAD, but that we could order a arterial duplex. Pt advised me that her pcp ordered a US Venous and she is having that tomorrow.  We will wait and see the results of that, per pt, she will call us with the results and if still needed, then we could order at that time.  Pt verbalized understanding.

## 2016-09-05 NOTE — Telephone Encounter (Signed)
Legs are swelling because of extra salt from eating out, not a circulation problem. She is having pain in her legs but had good pulses. Can order arterial dopplers to check circulation.

## 2016-09-05 NOTE — Telephone Encounter (Signed)
New Message  Pt call requesting to speak with RN . Pt states she was told to call the office today after appt with PCP. Please call back to discuss

## 2016-09-06 ENCOUNTER — Ambulatory Visit
Admission: RE | Admit: 2016-09-06 | Discharge: 2016-09-06 | Disposition: A | Discharge status: Home / Self Care | Payer: Medicare Other | Source: Ambulatory Visit | Attending: Internal Medicine | Admitting: Internal Medicine

## 2016-09-06 DIAGNOSIS — M7989 Other specified soft tissue disorders: Secondary | ICD-10-CM

## 2016-09-08 ENCOUNTER — Other Ambulatory Visit: Payer: Self-pay | Admitting: Cardiology

## 2016-09-08 DIAGNOSIS — R0989 Other specified symptoms and signs involving the circulatory and respiratory systems: Secondary | ICD-10-CM

## 2016-09-15 ENCOUNTER — Ambulatory Visit (HOSPITAL_COMMUNITY)
Admission: RE | Admit: 2016-09-15 | Discharge: 2016-09-15 | Disposition: A | Discharge status: Home / Self Care | Payer: Medicare Other | Source: Ambulatory Visit | Attending: Cardiovascular Disease | Admitting: Cardiovascular Disease

## 2016-09-15 DIAGNOSIS — R0989 Other specified symptoms and signs involving the circulatory and respiratory systems: Secondary | ICD-10-CM | POA: Insufficient documentation

## 2016-09-15 DIAGNOSIS — I10 Essential (primary) hypertension: Secondary | ICD-10-CM | POA: Diagnosis not present

## 2016-09-15 DIAGNOSIS — I6523 Occlusion and stenosis of bilateral carotid arteries: Secondary | ICD-10-CM | POA: Insufficient documentation

## 2016-09-15 DIAGNOSIS — I2510 Atherosclerotic heart disease of native coronary artery without angina pectoris: Secondary | ICD-10-CM | POA: Diagnosis not present

## 2016-09-15 DIAGNOSIS — E785 Hyperlipidemia, unspecified: Secondary | ICD-10-CM | POA: Insufficient documentation

## 2017-01-08 ENCOUNTER — Ambulatory Visit: Payer: Medicare Other | Admitting: Cardiology

## 2017-01-08 ENCOUNTER — Encounter: Payer: Self-pay | Admitting: Cardiology

## 2017-01-08 VITALS — BP 122/60 | HR 52 | Ht 68.0 in | Wt 219.0 lb

## 2017-01-08 DIAGNOSIS — E785 Hyperlipidemia, unspecified: Secondary | ICD-10-CM | POA: Diagnosis not present

## 2017-01-08 DIAGNOSIS — I2510 Atherosclerotic heart disease of native coronary artery without angina pectoris: Secondary | ICD-10-CM | POA: Diagnosis not present

## 2017-01-08 DIAGNOSIS — I10 Essential (primary) hypertension: Secondary | ICD-10-CM | POA: Diagnosis not present

## 2017-01-08 DIAGNOSIS — R600 Localized edema: Secondary | ICD-10-CM | POA: Diagnosis not present

## 2017-01-08 MED ORDER — FUROSEMIDE 40 MG PO TABS: 40.0000 mg | ORAL_TABLET | Freq: Every day | ORAL | 1 refills | Status: DC

## 2017-01-08 MED ORDER — VALSARTAN 80 MG PO TABS: 80.0000 mg | ORAL_TABLET | Freq: Every day | ORAL | 1 refills | Status: DC

## 2017-01-08 NOTE — Progress Notes (Signed)
Cardiology Office Note    Date:  01/08/2017   ID:  Leslie, Hart 1945/02/24, MRN [id number]  PCP:  [name], MD  Cardiologist: Dr Haroldine Laws -->  Leslie Dawley, MD   Chief compliant: Lower extremity edema  History of Present Illness:  [name] is a 71 y.o. female   [name] is a delightful 71 year old woman with a history of hypertension, hyperlipidemia, fibromuscular dysplasia of the intracranial vessels, and previous left internal carotid artery dissection. She also has a history of coronary artery disease. She is status post previous stenting to the LAD and diagonal. Most recently, she had a Promus drug-eluting stent to the second diagonal in December 2008.   Was in cardiac rehab in December 2011 and experienced CP. Took NTG and then had a presyncopal episode with SBP 80. With this developed transient L facial dropp thought to be due to cerebral hypoperfusion. CT/MRI normal. R/o for MI with serial cardiac markers. Lexiscan Myoview 12/11 showed EF 72% with normal perfusion.   Unable to tolerate b-blockers due to bradycardia.   07/07/15 - She returns for f/u, hasn't seen Dr Haroldine Laws in 2 years. She has noticed worsening DOE, no CP. Sh is not very active. She has been also noticing palpitations that start and end of of a sudden, couple times a day some days, the other days not at all, on exersion and at night. Denies syncope. No LE edema, orthopnea, PND. Lipids not checked in a while. On Pravastatin. No side effects.  04/18/2016 - 1 year follow-up, at the last year visit the patient complained of dyspnea on exertion and underwent nuclear stress testing that was negative for prior infarct or ischemia and showed normal LVEF. She also underwent Holter monitoring for palpitations that showed frequent PVCs and bigeminy and sinus bradycardia almost the entire monitoring time list. The patient underwent repeat echocardiogram that showed normal LVEF grade 1 diastolic dysfunction  and only trivial aortic regurgitation. Today she states that she continues to feel exertional dyspnea on moderate exertion however she doesn't exercise or walk with a regular basis. She denies any lower extremity edema orthopnea or proximal nocturnal dyspnea but states that she has been progressively more dizzy with no presyncope or syncope. She is also complaining of blood-tinged sputum that she experiences every morning.  [name] is a 71 y.o. female with history of CAD status post stenting of the LAD and diagonal 2 and PTCA for early in stent restenosis to diagonal 2 in 2008. Palpitations in 2017 with Holter showing frequent PVCs and bigeminy and sinus bradycardia almost entire monitoring time. Echo showed normal LVEF and grade 1 DD with trivial AI. Nuclear stress test 06/2015 low risk study, no ischemia LVEF 71%. Patient also has hypertension, HLD and history of dizziness. Repeat 24-hour Holter 04/2016 showed sinus bradycardia to sinus rhythm frequent PACs, and frequent PVCs, no AV blocks or pauses. Recommended Plavix long term and aspirin stopped.  01/08/2017 - this is 4 months follow-up, at the last visit to complain of lower extremity edema and was felt that she is noncompliant with a low-sodium diet, she still is not compliant low-sodium diet but her weight has increased by 5 pounds since then lower extremity edema has become worse. She denies any orthopnea or paroxysmal nocturnal dyspnea, she denies any significant palpitations dizziness or syncope. No chest pain dyspnea on moderate exertion. This has been stable.   Past Medical History:  Diagnosis Date  . Chest pain   . Coronary  artery disease   . GERD (gastroesophageal reflux disease)   . Headache(784.0)    hx of  . Heart murmur   . Hyperlipidemia    takes pravachol  . Hypertension   . Internal hemorrhoids without mention of complication   . Neuromuscular disorder ([place]) 2010   Fibromuscular dysplasia  . Neutropenia, unspecified  ([place])   . Shortness of breath    with exertion    Past Surgical History:  Procedure Laterality Date  . ABDOMINAL HYSTERECTOMY    . CARDIAC CATHETERIZATION  2008  . Cardiac Stents  2008  . COLONOSCOPY  2010   normal   . gallstones removed    . PARS PLANA VITRECTOMY WITH 23 GAUGE Left 06/26/2011   Performed by Adonis Brook, MD at [facility]  . VITRECTOMY AND CATARACT Left 05/22/2011   Performed by Adonis Brook, MD at [facility] OR    Current Medications: Outpatient Medications Prior to Visit  Medication Sig Dispense Refill  . aspirin EC 81 MG tablet Take 81 mg daily by mouth.    . clopidogrel (PLAVIX) 75 MG tablet Take 1 tablet (75 mg total) by mouth daily. 90 tablet 3  . nitroGLYCERIN (NITROSTAT) 0.4 MG SL tablet Place 0.4 mg under the tongue every 5 (five) minutes as needed. For chest pain    . omeprazole (PRILOSEC) 40 MG capsule TAKE 1 CAPSULE (40 MG TOTAL) BY MOUTH DAILY. 90 capsule 3  . pravastatin (PRAVACHOL) 40 MG tablet Take 40 mg by mouth daily.    . valsartan-hydrochlorothiazide (DIOVAN-HCT) 80-12.5 MG tablet Take 1 tablet by mouth daily. 90 tablet 2   Facility-Administered Medications Prior to Visit  Medication Dose Route Frequency Provider Last Rate Last Dose  . 0.9 %  sodium chloride infusion  500 mL Intravenous Continuous Armbruster, Carlota Raspberry, MD         Allergies:   Nalfon [fenoprofen calcium]   Social History   Socioeconomic History  . Marital status: Married    Spouse name: [name]  . Number of children: 1  . Years of education: 38  . Highest education level: None  Social Needs  . Financial resource strain: None  . Food insecurity - worry: None  . Food insecurity - inability: None  . Transportation needs - medical: None  . Transportation needs - non-medical: None  Occupational History  . Occupation: Retired  Tobacco Use  . Smoking status: Never Smoker  . Smokeless tobacco: Never Used  Substance and Sexual Activity  . Alcohol use: No  . Drug use: No  . Sexual  activity: Yes    Birth control/protection: Surgical  Other Topics Concern  . None  Social History Narrative   Lives with husband   Caffeine use: Tea rare     Family History:  The patient's family history includes Breast cancer in her sister; Colon cancer in her mother; Heart attack in her father; Heart disease in her mother.   ROS:   Please see the history of present illness.    ROS All other systems reviewed and are negative.   PHYSICAL EXAM:   VS:  BP 122/60   Pulse (!) 52   Ht 5\' 8"  (1.727 m)   Wt 219 lb (99.3 kg)   SpO2 99%   BMI 33.30 kg/m    GEN: Well nourished, well developed, in no acute distress  HEENT: normal  Neck: no JVD, carotid bruits, or masses Cardiac: RRR; 2/6 DIAST murmurs, rubs, or gallops, 2+ bilateral lower extremity edema up to mid  calves  Respiratory:  clear to auscultation bilaterally, normal work of breathing GI: soft, nontender, nondistended, + BS MS: no deformity or atrophy  Skin: warm and dry, no rash Neuro:  Alert and Oriented x 3, Strength and sensation are intact Psych: euthymic mood, full affect  Wt Readings from Last 3 Encounters:  01/08/17 219 lb (99.3 kg)  09/04/16 213 lb (96.6 kg)  04/19/16 217 lb (98.4 kg)     Studies/Labs Reviewed:   EKG:  Performed today 01/08/2017 was personally reviewed and shows sinus bradycardia with nonspecific ST-T wave abnormalities, unchanged from prior.  Recent Labs: No results found for requested labs within last 8760 hours.   Lipid Panel    Component Value Date/Time   CHOL 151 07/07/2015 0913   TRIG 65 07/07/2015 0913   HDL 65 07/07/2015 0913   CHOLHDL 2.3 07/07/2015 0913   VLDL 13 07/07/2015 0913   LDLCALC 73 07/07/2015 0913    Additional studies/ records that were reviewed today include:   TTE: 06/2015 Left ventricle: The cavity size was normal. There was mild focal   basal hypertrophy of the septum. Systolic function was normal.   The estimated ejection fraction was in the range of 60%  to 65%.   Wall motion was normal; there were no regional wall motion   abnormalities. Left ventricular diastolic function parameters   were normal. - Aortic valve: Calcified non coronary cusp. There was trivial   regurgitation..  Nuclear stress test: 06/2015  Nuclear stress EF: 71%.  There was 89mm of horizontal ST segment depression in the lateral precordial leads that became downsloping in recovery. Patient had submaximal exercise treadmill test and was change to a Lexiscan.  The nuclear images are normal.  This is a low risk study.  The left ventricular ejection fraction is hyperdynamic (>65%).   EKG today is no reviewed and shows sinus bradycardia 56 bpm, nonspecific T-wave abnormalities unchanged from prior.   ASSESSMENT:    No diagnosis found.   PLAN:  In order of problems listed above:  1. CAD - Stable DOE - Negative stress test year ago was probably secondary to deconditioning. Continue current regimen. Given first generation DES in LAD would continue Plavix indefinitely. Lipids at goal in May 2018, LDL 70, HDL 57 triglycerides 57. Normal liver function test. We will recheck today.  2. HTN - Blood pressure well controlled. Continue current regimen.  3. HLP - as above  4. Lower extremity edema, we'll discontinue hydrochlorothiazide 12.5 mg daily start Lasix 40 mg daily follow-up in 4 weeks, check CMP, CBC, TSH and lipids today. Repeat echocardiogram.  Medication Adjustments/Labs and Tests Ordered: Current medicines are reviewed at length with the patient today.  Concerns regarding medicines are outlined above.  Medication changes, Labs and Tests ordered today are listed in the Patient Instructions below. There are no Patient Instructions on file for this visit.   Signed, Leslie Dawley, MD  01/08/2017 12:18 PM    [address], [place], [place]  [address] Phone: [phone]; Fax: [phone]

## 2017-01-08 NOTE — Patient Instructions (Signed)
Medication Instructions:   STOP TAKING DIOVAN/HCTZ NOW  START TAKING VALSARTAN 80 MG ONCE DAILY  START TAKING LASIX 40 MG ONCE DAILY    Labwork:  TODAY---CMET, CBC W DIFF, TSH, PRO-BNP, AND LIPIDS     Follow-Up:  4 WEEKS WITH DR NELSON---PER DR Meda Coffee IT IS OK TO ADD HER TO AN OPEN SLOT ON THESE DAYS SHE ADDED HERSELF IN:  12/17, 12/18, OR 12/19.       If you need a refill on your cardiac medications before your next appointment, please call your pharmacy.

## 2017-01-09 LAB — CBC WITH DIFFERENTIAL/PLATELET
Basophils Absolute: 0 10*3/uL (ref 0.0–0.2)
Basos: 0 %
EOS (ABSOLUTE): 0.1 10*3/uL (ref 0.0–0.4)
Eos: 2 %
Hematocrit: 34.7 % (ref 34.0–46.6)
Hemoglobin: 11.8 g/dL (ref 11.1–15.9)
Immature Grans (Abs): 0 10*3/uL (ref 0.0–0.1)
Immature Granulocytes: 0 %
Lymphocytes Absolute: 1.2 10*3/uL (ref 0.7–3.1)
Lymphs: 40 %
MCH: 28.6 pg (ref 26.6–33.0)
MCHC: 34 g/dL (ref 31.5–35.7)
MCV: 84 fL (ref 79–97)
Monocytes Absolute: 0.3 10*3/uL (ref 0.1–0.9)
Monocytes: 11 %
Neutrophils Absolute: 1.3 10*3/uL — ABNORMAL LOW (ref 1.4–7.0)
Neutrophils: 47 %
Platelets: 186 10*3/uL (ref 150–379)
RBC: 4.12 x10E6/uL (ref 3.77–5.28)
RDW: 16.6 % — ABNORMAL HIGH (ref 12.3–15.4)
WBC: 2.9 10*3/uL — ABNORMAL LOW (ref 3.4–10.8)

## 2017-01-09 LAB — COMPREHENSIVE METABOLIC PANEL
ALT: 13 IU/L (ref 0–32)
AST: 20 IU/L (ref 0–40)
Albumin/Globulin Ratio: 1.4 (ref 1.2–2.2)
Albumin: 4.1 g/dL (ref 3.5–4.8)
Alkaline Phosphatase: 92 IU/L (ref 39–117)
BUN/Creatinine Ratio: 17 (ref 12–28)
BUN: 19 mg/dL (ref 8–27)
Bilirubin Total: 0.6 mg/dL (ref 0.0–1.2)
CO2: 27 mmol/L (ref 20–29)
Calcium: 9.3 mg/dL (ref 8.7–10.3)
Chloride: 103 mmol/L (ref 96–106)
Creatinine, Ser: 1.1 mg/dL — ABNORMAL HIGH (ref 0.57–1.00)
GFR calc Af Amer: 58 mL/min/{1.73_m2} — ABNORMAL LOW (ref >59)
GFR calc non Af Amer: 50 mL/min/{1.73_m2} — ABNORMAL LOW (ref >59)
Globulin, Total: 2.9 g/dL (ref 1.5–4.5)
Glucose: 94 mg/dL (ref 65–99)
Potassium: 4.3 mmol/L (ref 3.5–5.2)
Sodium: 143 mmol/L (ref 134–144)
Total Protein: 7 g/dL (ref 6.0–8.5)

## 2017-01-09 LAB — LIPID PANEL
Chol/HDL Ratio: 2.4 ratio (ref 0.0–4.4)
Cholesterol, Total: 144 mg/dL (ref 100–199)
HDL: 61 mg/dL (ref >39)
LDL Calculated: 67 mg/dL (ref 0–99)
Triglycerides: 80 mg/dL (ref 0–149)
VLDL Cholesterol Cal: 16 mg/dL (ref 5–40)

## 2017-01-09 LAB — TSH: TSH: 1.95 u[IU]/mL (ref 0.450–4.500)

## 2017-01-09 LAB — PRO B NATRIURETIC PEPTIDE: NT-Pro BNP: 68 pg/mL (ref 0–301)

## 2017-02-07 ENCOUNTER — Ambulatory Visit: Payer: Medicare Other | Admitting: Cardiology

## 2017-02-07 ENCOUNTER — Encounter: Payer: Self-pay | Admitting: Cardiology

## 2017-02-07 VITALS — BP 98/50 | HR 58 | Resp 16 | Ht 68.0 in | Wt 215.6 lb

## 2017-02-07 DIAGNOSIS — I10 Essential (primary) hypertension: Secondary | ICD-10-CM | POA: Diagnosis not present

## 2017-02-07 DIAGNOSIS — I5033 Acute on chronic diastolic (congestive) heart failure: Secondary | ICD-10-CM | POA: Diagnosis not present

## 2017-02-07 DIAGNOSIS — I2510 Atherosclerotic heart disease of native coronary artery without angina pectoris: Secondary | ICD-10-CM | POA: Diagnosis not present

## 2017-02-07 DIAGNOSIS — E785 Hyperlipidemia, unspecified: Secondary | ICD-10-CM

## 2017-02-07 NOTE — Progress Notes (Signed)
Cardiology Office Note    Date:  02/07/2017   ID:  Treniyah, Lynn [birth year], MRN [id number]  PCP:  [name], MD  Cardiologist: Dr Haroldine Laws -->  [name], MD   Chief compliant: Lower extremity edema  History of Present Illness:  [name] is a 71 y.o. female   Leslie. Hart is a delightful 71 year old woman with a history of hypertension, hyperlipidemia, fibromuscular dysplasia of the intracranial vessels, and previous left internal carotid artery dissection. She also has a history of coronary artery disease. She is status post previous stenting to the LAD and diagonal. Most recently, she had a Promus drug-eluting stent to the second diagonal in December 2008.   Was in cardiac rehab in December 2011 and experienced CP. Took NTG and then had a presyncopal episode with SBP 80. With this developed transient L facial dropp thought to be due to cerebral hypoperfusion. CT/MRI normal. R/o for MI with serial cardiac markers. Lexiscan Myoview 12/11 showed EF 72% with normal perfusion.   Unable to tolerate b-blockers due to bradycardia.   07/07/15 - She returns for f/u, hasn't seen Dr Haroldine Laws in 2 years. She has noticed worsening DOE, no CP. Sh is not very active. She has been also noticing palpitations that start and end of of a sudden, couple times a day some days, the other days not at all, on exersion and at night. Denies syncope. No LE edema, orthopnea, PND. Lipids not checked in a while. On Pravastatin. No side effects.  04/18/2016 - 1 year follow-up, at the last year visit the patient complained of dyspnea on exertion and underwent nuclear stress testing that was negative for prior infarct or ischemia and showed normal LVEF. She also underwent Holter monitoring for palpitations that showed frequent PVCs and bigeminy and sinus bradycardia almost the entire monitoring time list. The patient underwent repeat echocardiogram that showed normal LVEF grade 1 diastolic dysfunction  and only trivial aortic regurgitation. Today she states that she continues to feel exertional dyspnea on moderate exertion however she doesn't exercise or walk with a regular basis. She denies any lower extremity edema orthopnea or proximal nocturnal dyspnea but states that she has been progressively more dizzy with no presyncope or syncope. She is also complaining of blood-tinged sputum that she experiences every morning.  [name] is a 71 y.o. female with history of CAD status post stenting of the LAD and diagonal 2 and PTCA for early in stent restenosis to diagonal 2 in 2008. Palpitations in 2017 with Holter showing frequent PVCs and bigeminy and sinus bradycardia almost entire monitoring time. Echo showed normal LVEF and grade 1 DD with trivial AI. Nuclear stress test 06/2015 low risk study, no ischemia LVEF 71%. Patient also has hypertension, HLD and history of dizziness. Repeat 24-hour Holter 04/2016 showed sinus bradycardia to sinus rhythm frequent PACs, and frequent PVCs, no AV blocks or pauses. Recommended Plavix long term and aspirin stopped.  01/08/2017 - this is 4 months follow-up, at the last visit to complain of lower extremity edema and was felt that she is noncompliant with a low-sodium diet, she still is not compliant low-sodium diet but her weight has increased by 5 pounds since then lower extremity edema has become worse. She denies any orthopnea or paroxysmal nocturnal dyspnea, she denies any significant palpitations dizziness or syncope. No chest pain dyspnea on moderate exertion. This has been stable.  02/07/2017 - the patient is coming after 1 months, at the last visit she was  started on Lasix 40 mg daily for lower extremity edema that helped significantly however she is going to the bathroom only. She is never used compression stockings. She otherwise denies any chest shortness of breath no orthopnea or proximal nocturnal dyspnea. No dizziness or falls.  Past Medical History:    Diagnosis Date  . Chest pain   . Coronary artery disease   . GERD (gastroesophageal reflux disease)   . Headache(784.0)    hx of  . Heart murmur   . Hyperlipidemia    takes pravachol  . Hypertension   . Internal hemorrhoids without mention of complication   . Neuromuscular disorder ([place]) 2010   Fibromuscular dysplasia  . Neutropenia, unspecified ([place])   . Shortness of breath    with exertion    Past Surgical History:  Procedure Laterality Date  . ABDOMINAL HYSTERECTOMY    . CARDIAC CATHETERIZATION  2008  . Cardiac Stents  2008  . COLONOSCOPY  2010   normal   . gallstones removed    . PARS PLANA VITRECTOMY  06/26/2011   Procedure: PARS PLANA VITRECTOMY WITH 23 GAUGE;  Surgeon: Adonis Brook, MD;  Location: [facility];  Service: Ophthalmology;  Laterality: Left;    Current Medications: Outpatient Medications Prior to Visit  Medication Sig Dispense Refill  . aspirin EC 81 MG tablet Take 81 mg daily by mouth.    . clopidogrel (PLAVIX) 75 MG tablet Take 1 tablet (75 mg total) by mouth daily. 90 tablet 3  . furosemide (LASIX) 40 MG tablet Take 1 tablet (40 mg total) daily by mouth. 90 tablet 1  . nitroGLYCERIN (NITROSTAT) 0.4 MG SL tablet Place 0.4 mg under the tongue every 5 (five) minutes as needed. For chest pain    . omeprazole (PRILOSEC) 40 MG capsule TAKE 1 CAPSULE (40 MG TOTAL) BY MOUTH DAILY. 90 capsule 3  . pravastatin (PRAVACHOL) 40 MG tablet Take 40 mg by mouth daily.    . valsartan (DIOVAN) 80 MG tablet Take 1 tablet (80 mg total) daily by mouth. 90 tablet 1   Facility-Administered Medications Prior to Visit  Medication Dose Route Frequency Provider Last Rate Last Dose  . 0.9 %  sodium chloride infusion  500 mL Intravenous Continuous Armbruster, Carlota Raspberry, MD         Allergies:   Nalfon [fenoprofen calcium]   Social History   Socioeconomic History  . Marital status: Married    Spouse name: [name]  . Number of children: 1  . Years of education: 23  . Highest  education level: None  Social Needs  . Financial resource strain: None  . Food insecurity - worry: None  . Food insecurity - inability: None  . Transportation needs - medical: None  . Transportation needs - non-medical: None  Occupational History  . Occupation: Retired  Tobacco Use  . Smoking status: Never Smoker  . Smokeless tobacco: Never Used  Substance and Sexual Activity  . Alcohol use: No  . Drug use: No  . Sexual activity: Yes    Birth control/protection: Surgical  Other Topics Concern  . None  Social History Narrative   Lives with husband   Caffeine use: Tea rare     Family History:  The patient's family history includes Breast cancer in her sister; Colon cancer in her mother; Heart attack in her father; Heart disease in her mother.   ROS:   Please see the history of present illness.    ROS All other systems reviewed and are negative.  PHYSICAL EXAM:   VS:  BP (!) 98/50   Pulse (!) 58   Resp 16   Ht 5\' 8"  (1.727 m)   Wt 215 lb 9.6 oz (97.8 kg)   SpO2 97%   BMI 32.78 kg/m    GEN: Well nourished, well developed, in no acute distress  HEENT: normal  Neck: no JVD, carotid bruits, or masses Cardiac: RRR; 2/6 DIAST murmurs, rubs, or gallops, mild bilateral lower extremity edema only around ankles  Respiratory:  clear to auscultation bilaterally, normal work of breathing GI: soft, nontender, nondistended, + BS Leslie: no deformity or atrophy  Skin: warm and dry, no rash Neuro:  Alert and Oriented x 3, Strength and sensation are intact Psych: euthymic mood, full affect  Wt Readings from Last 3 Encounters:  02/07/17 215 lb 9.6 oz (97.8 kg)  01/08/17 219 lb (99.3 kg)  09/04/16 213 lb (96.6 kg)     Studies/Labs Reviewed:   EKG:  Performed today 01/08/2017 was personally reviewed and shows sinus bradycardia with nonspecific ST-T wave abnormalities, unchanged from prior.  Recent Labs: 01/08/2017: ALT 13; BUN 19; Creatinine, Ser 1.10; Hemoglobin 11.8; NT-Pro BNP  68; Platelets 186; Potassium 4.3; Sodium 143; TSH 1.950   Lipid Panel    Component Value Date/Time   CHOL 144 01/08/2017 1309   TRIG 80 01/08/2017 1309   HDL 61 01/08/2017 1309   CHOLHDL 2.4 01/08/2017 1309   CHOLHDL 2.3 07/07/2015 0913   VLDL 13 07/07/2015 0913   LDLCALC 67 01/08/2017 1309    Additional studies/ records that were reviewed today include:   TTE: 06/2015 Left ventricle: The cavity size was normal. There was mild focal   basal hypertrophy of the septum. Systolic function was normal.   The estimated ejection fraction was in the range of 60% to 65%.   Wall motion was normal; there were no regional wall motion   abnormalities. Left ventricular diastolic function parameters   were normal. - Aortic valve: Calcified non coronary cusp. There was trivial   regurgitation..  Nuclear stress test: 06/2015  Nuclear stress EF: 71%.  There was 69mm of horizontal ST segment depression in the lateral precordial leads that became downsloping in recovery. Patient had submaximal exercise treadmill test and was change to a Lexiscan.  The nuclear images are normal.  This is a low risk study.  The left ventricular ejection fraction is hyperdynamic (>65%).   EKG today is no reviewed and shows sinus bradycardia 56 bpm, nonspecific T-wave abnormalities unchanged from prior.   ASSESSMENT:    1. Coronary artery disease involving native coronary artery of native heart without angina pectoris      PLAN:  In order of problems listed above:  1. CAD - Stable DOE - Negative stress test year ago was probably secondary to deconditioning. Continue current regimen. Given first generation DES in LAD would continue Plavix indefinitely. All lipids and LFTs at goal in November 2018.  2. HTN - Blood pressure well controlled. Continue current regimen.  3. HLP - as above  4. Lower extremity edema, all labs normal last month including TSH, she was started like 640 mg daily with significant  improvement, she is advised to cut in half and start using compression stocking and take deep at her half if needed for lower extremity edema..  Medication Adjustments/Labs and Tests Ordered: Current medicines are reviewed at length with the patient today.  Concerns regarding medicines are outlined above.  Medication changes, Labs and Tests ordered today are listed in  the Patient Instructions below. Patient Instructions  Medication Instructions:   Your physician recommends that you continue on your current medications as directed. Please refer to the Current Medication list given to you today.      Follow-Up:  Your physician wants you to follow-up in: [place] will receive a reminder letter in the mail two months in advance. If you don't receive a letter, please call our office to schedule the follow-up appointment.        If you need a refill on your cardiac medications before your next appointment, please call your pharmacy.      Signed, [name], MD  02/07/2017 4:04 PM    [facility] [place], [place], [place]  [address] Phone: [phone]; Fax: [phone]

## 2017-02-07 NOTE — Patient Instructions (Signed)

## 2017-05-29 ENCOUNTER — Other Ambulatory Visit: Payer: Self-pay | Admitting: Cardiology

## 2017-05-29 DIAGNOSIS — I10 Essential (primary) hypertension: Secondary | ICD-10-CM

## 2017-05-29 DIAGNOSIS — R600 Localized edema: Secondary | ICD-10-CM

## 2017-05-29 DIAGNOSIS — E785 Hyperlipidemia, unspecified: Secondary | ICD-10-CM

## 2017-05-30 NOTE — Telephone Encounter (Signed)
Outpatient Medication Detail    Disp Refills Start End   valsartan (DIOVAN) 80 MG tablet 90 tablet 1 01/08/2017    Sig - Route: Take 1 tablet (80 mg total) daily by mouth. - Oral   Sent to pharmacy as: valsartan (DIOVAN) 80 MG tablet   E-Prescribing Status: Receipt confirmed by pharmacy (01/08/2017 12:52 PM EST)   Associated Diagnoses   Leg edema - Primary     Essential hypertension, benign     Hyperlipidemia, unspecified hyperlipidemia type     Pharmacy   [id number] - [place], [place] - [address]. AT [address]

## 2017-06-06 ENCOUNTER — Other Ambulatory Visit: Payer: Self-pay | Admitting: Cardiology

## 2017-06-06 DIAGNOSIS — E785 Hyperlipidemia, unspecified: Secondary | ICD-10-CM

## 2017-06-06 DIAGNOSIS — R600 Localized edema: Secondary | ICD-10-CM

## 2017-06-06 DIAGNOSIS — I10 Essential (primary) hypertension: Secondary | ICD-10-CM

## 2017-06-06 MED ORDER — VALSARTAN 80 MG PO TABS: 80.0000 mg | ORAL_TABLET | Freq: Every day | ORAL | 2 refills | Status: DC

## 2017-07-28 ENCOUNTER — Other Ambulatory Visit: Payer: Self-pay | Admitting: Gastroenterology

## 2017-08-08 ENCOUNTER — Encounter: Payer: Self-pay | Admitting: Neurology

## 2017-08-09 ENCOUNTER — Ambulatory Visit (INDEPENDENT_AMBULATORY_CARE_PROVIDER_SITE_OTHER): Payer: Medicare Other | Admitting: Neurology

## 2017-08-09 ENCOUNTER — Encounter: Payer: Self-pay | Admitting: Neurology

## 2017-08-09 VITALS — BP 114/63 | HR 50 | Ht 68.0 in | Wt 216.0 lb

## 2017-08-09 DIAGNOSIS — R0681 Apnea, not elsewhere classified: Secondary | ICD-10-CM

## 2017-08-09 DIAGNOSIS — R400 Somnolence: Secondary | ICD-10-CM

## 2017-08-09 DIAGNOSIS — K219 Gastro-esophageal reflux disease without esophagitis: Secondary | ICD-10-CM | POA: Diagnosis not present

## 2017-08-09 DIAGNOSIS — K589 Irritable bowel syndrome without diarrhea: Secondary | ICD-10-CM | POA: Diagnosis not present

## 2017-08-09 DIAGNOSIS — R0683 Snoring: Secondary | ICD-10-CM

## 2017-08-09 DIAGNOSIS — R51 Headache: Secondary | ICD-10-CM | POA: Diagnosis not present

## 2017-08-09 DIAGNOSIS — R519 Headache, unspecified: Secondary | ICD-10-CM

## 2017-08-09 NOTE — Progress Notes (Signed)
SLEEP MEDICINE CLINIC   Provider:  Larey Hart, M D  Primary Care Physician:  Leslie Gravel, MD   Referring Provider: [name], MD     Chief Complaint  Patient presents with  . New Patient (Initial Visit)    pt alone, rm 11. pt states that she wakes up with headaches intermittently. pt denies snoring or apneic events. denies any problems with sleep.     HPI:  [name] is a 72 y.o. female , seen here in a referral from Dr. Maudie Hart for a sleep apnea evaluation.  Dr [name] mentioned that there may be a correlation to morning headaches, and she has no other headaches.  Chief complaint according to patient :[name] also mentioned that her headaches are not a daily occurrence.  Maybe once a week or even less than that. She is sometimes fatigued, not often.   The patient medications were reviewed today, she is on Aldactone, hydrochlorothiazide, Plavix, aspirin, pravastatin, vitamin D, Nitrostat sublingual.  The patient has mitral regurgitation, remote stroke affecting the left corona radiata and left basal ganglia.  Hypercholesterolemia and coronary artery disease status post PTCA LAD in 2017 and since then on Plavix.  She reportedly snores and she has talked to her cardiologist [name] recently there was no cardiac reason for her to be fatigued.    Sleep habits are as follows: she goes to bed between 11.00 and midnight. Before he goes to bed she is doing housekeeping chores, and her bedroom is cool, quiet and dark. She is temperature sensitive. She falls asleep promptly, and stays asleep for several hours before the first of two bathroom breaks.  She sleeps on her side, on 1 pillow. Her husband is sharing the bed with her, he sleeps soundly. No nightmares, husband has not noted kicking, yelling ,etc. she wakes up spontaneously around 7 AM.  Usually she will get about 6.5 hours of nocturnal sleep. She only sometimes feels rested and restored in the morning, often not. After  about 1 or 2 hours being awake she feels like she would like to go to bed again.  She does not take naps in daytime.  She also does not report dozing.    Sleep medical history and family sleep history: Patient surgical history is positive for hysterectomy related to fibroids, tonsillectomy, she had bilateral cataract surgery, vitrectomies, she had an endolaser surgery.  Her family history is positive for uterine cancer in her mother who died at age 26, her father died at 37 years of age, a sister has breast cancer.   Social history:  Retired from [name]- [name] Non smoker, non drinker. Caffeine - iced tea, sweet  - not more than 2- a day. Denies sodas and coffee. Married, one child, 2 sisters.  Grew up in [place].   Review of Systems: Out of a complete 14 system review, the patient complains of only the following symptoms, and all other reviewed systems are negative. snoring some nights. No reported apnea, excessive yawning.  morning headache - 3-4 a month .   Epworth score 12/ 24 , Fatigue severity score 24  , depression score 2/ 15    Social History   Socioeconomic History  . Marital status: Married    Spouse name: [name]  . Number of children: 1  . Years of education: 29  . Highest education level: Not on file  Occupational History  . Occupation: Retired  Scientific laboratory technician  . Financial resource strain: Not  on file  . Food insecurity:    Worry: Not on file    Inability: Not on file  . Transportation needs:    Medical: Not on file    Non-medical: Not on file  Tobacco Use  . Smoking status: Never Smoker  . Smokeless tobacco: Never Used  Substance and Sexual Activity  . Alcohol use: No  . Drug use: No  . Sexual activity: Yes    Birth control/protection: Surgical  Lifestyle  . Physical activity:    Days per week: Not on file    Minutes per session: Not on file  . Stress: Not on file  Relationships  . Social connections:    Talks on phone: Not on file     Gets together: Not on file    Attends religious service: Not on file    Active member of club or organization: Not on file    Attends meetings of clubs or organizations: Not on file    Relationship status: Not on file  . Intimate partner violence:    Fear of current or ex partner: Not on file    Emotionally abused: Not on file    Physically abused: Not on file    Forced sexual activity: Not on file  Other Topics Concern  . Not on file  Social History Narrative   Lives with husband   Caffeine use: Tea rare    Family History  Problem Relation Age of Onset  . Heart disease Mother   . Colon cancer Mother   . Heart attack Father   . Breast cancer Sister        x 2  . Anesthesia problems Neg Hx   . Hypotension Neg Hx   . Malignant hyperthermia Neg Hx   . Pseudochol deficiency Neg Hx     Past Medical History:  Diagnosis Date  . Chest pain   . Coronary artery disease   . GERD (gastroesophageal reflux disease)   . Headache(784.0)    hx of  . Heart murmur   . Hyperlipidemia    takes pravachol  . Hypertension   . Internal hemorrhoids without mention of complication   . Neuromuscular disorder ([place]) 2010   Fibromuscular dysplasia  . Neutropenia, unspecified ([place])   . Shortness of breath    with exertion  . Stroke [facility])     Past Surgical History:  Procedure Laterality Date  . ABDOMINAL HYSTERECTOMY    . CARDIAC CATHETERIZATION  2008  . Cardiac Stents  2008  . COLONOSCOPY  2010   normal   . gallstones removed    . PARS PLANA VITRECTOMY  06/26/2011   Procedure: PARS PLANA VITRECTOMY WITH 23 GAUGE;  Surgeon: Adonis Brook, MD;  Location: [facility];  Service: Ophthalmology;  Laterality: Left;    Current Outpatient Medications  Medication Sig Dispense Refill  . aspirin EC 81 MG tablet Take 81 mg daily by mouth.    . Cholecalciferol (VITAMIN D) 2000 units CAPS Take 1,000 Units by mouth daily.    . clopidogrel (PLAVIX) 75 MG tablet Take 1 tablet (75 mg total) by mouth daily. 90  tablet 3  . nitroGLYCERIN (NITROSTAT) 0.4 MG SL tablet Place 0.4 mg under the tongue every 5 (five) minutes as needed. For chest pain    . pravastatin (PRAVACHOL) 40 MG tablet Take 40 mg by mouth daily.    . valsartan-hydrochlorothiazide (DIOVAN-HCT) 80-12.5 MG tablet Take 1 tablet by mouth daily.    . Vitamin D, Ergocalciferol, (DRISDOL) 50000  units CAPS capsule TAKE 1 CAPSULE EVERY WEEK  2  . furosemide (LASIX) 40 MG tablet Take 1 tablet (40 mg total) daily by mouth. 90 tablet 1   Current Facility-Administered Medications  Medication Dose Route Frequency Provider Last Rate Last Dose  . 0.9 %  sodium chloride infusion  500 mL Intravenous Continuous Armbruster, Carlota Raspberry, MD        Allergies as of 08/09/2017 - Review Complete 08/09/2017  Allergen Reaction Noted  . Nalfon [fenoprofen calcium] Other (See Comments) 05/17/2011    Vitals: BP 114/63   Pulse (!) 50   Ht 5\' 8"  (1.727 m)   Wt 216 lb (98 kg)   BMI 32.84 kg/m  Last Weight:  Wt Readings from Last 1 Encounters:  08/09/17 216 lb (98 kg)   BHA:LPFX mass index is 32.84 kg/m.     Last Height:   Ht Readings from Last 1 Encounters:  08/09/17 5\' 8"  (1.727 m)    Physical exam:  General: The patient is awake, alert and appears not in acute distress. The patient is well groomed. Head: Normocephalic, atraumatic. Neck is supple. Mallampati 4 ,  neck circumference: 16 . Nasal airflow restricted ,  Retrognathia is seen.  Cardiovascular:  Regular rate and rhythm - slow !!!, without  murmurs or carotid bruit, and without distended neck veins. Respiratory: Lungs are clear to auscultation. Skin:  Without evidence of edema, or rash Trunk: BMI is 32.8 . The patient's posture is stooped    Neurologic exam : The patient is awake and alert, oriented to place and time.   Memory subjective described as intact.   MOCA:No flowsheet data found. Psychomotor slowing. Speech is slow- fluent, dysphonia , slowed speech. She is drowsy  Cranial  nerves: Pupils are equal and briskly reactive to light. Funduscopic exam status post cataract surgery- evidence of pallor or edema. Extraocular movements  in vertical and horizontal planes intact and without nystagmus. Visual fields by finger perimetry are intact. Hearing to finger rub intact.  Facial sensation intact to fine touch. Facial motor strength is symmetric and tongue and uvula move midline. Shoulder shrug was symmetrical.  Motor exam:  Normal tone, muscle bulk and symmetric strength in all extremities. She had a remote CVA over 10 stroke.  Sensory:  Fine touch, pinprick and vibration were attenuated at ankle level- edema noted. Marland Kitchen Proprioception tested in the upper extremities was normal. Coordination: Rapid alternating movements in the fingers/hands was normal. Finger-to-nose maneuver  normal without evidence of ataxia, dysmetria or tremor. Gait and station: Patient walks without assistive device.Tandem gait deferred. .  Deep tendon reflexes: in the  upper and lower extremities are symmetric and intact.     Assessment:  After physical and neurologic examination, review of laboratory studies,  Personal review of imaging studies, reports of other /same  Imaging studies, results of polysomnography and / or neurophysiology testing and pre-existing records as far as provided in visit., my assessment is   1) I am not sure there is much yield for a sleep study- she is neither excessively sleepy , nor having frequent headaches and came alone- all symptoms denied.    2) I will be happy to screen her for snoring / OSA  3) she is most fatigued after lunch.  She denies palpitations recently, and reports being unaware of fainting/ Near fainting or diaphoresis. She has bradycardia.    The patient was advised of the nature of the diagnosed disorder , the treatment options and the  risks for  general health and wellness arising from not treating the condition.   I spent more than 50  minutes of face  to face time with the patient.  Greater than 50% of time was spent in counseling and coordination of care. We have discussed the diagnosis and differential and I answered the patient's questions.    Plan:  Treatment plan and additional workup : Dear Dr. Maudie Hart ,   I will evaluate this patient by HST as a screening test. Her history report is not congruent with the medical records provided, and I wonder if that could be true for symptoms report as well. ankle edema, slowed speech, CAD and CVA in the past, reported  ocasional snoring.   PSG, split at AHI 40.   Leslie Seat, MD 10/24/90, 33:00 AM  Certified in Neurology by ABPN Certified in [place] by [facility] [address], [place], [place] [address]

## 2017-09-17 ENCOUNTER — Telehealth: Payer: Self-pay | Admitting: Neurology

## 2017-09-17 NOTE — Telephone Encounter (Signed)
We have attempted to call the patient 2 times to schedule sleep study. Patient has been unavailable at the phone numbers we have on file and has not returned our calls. At this point we will send a letter asking pt to please contact the sleep lab to schedule their sleep study. If patient calls back we will schedule them for their sleep study. ° °

## 2017-10-03 ENCOUNTER — Telehealth: Payer: Self-pay

## 2017-10-03 NOTE — Telephone Encounter (Addendum)
Attached labs/notes to August file. No pending heartcare appointment

## 2017-10-08 ENCOUNTER — Ambulatory Visit (INDEPENDENT_AMBULATORY_CARE_PROVIDER_SITE_OTHER): Payer: Medicare Other | Admitting: Neurology

## 2017-10-08 DIAGNOSIS — K219 Gastro-esophageal reflux disease without esophagitis: Secondary | ICD-10-CM

## 2017-10-08 DIAGNOSIS — R51 Headache: Secondary | ICD-10-CM

## 2017-10-08 DIAGNOSIS — K589 Irritable bowel syndrome without diarrhea: Secondary | ICD-10-CM

## 2017-10-08 DIAGNOSIS — R400 Somnolence: Secondary | ICD-10-CM

## 2017-10-08 DIAGNOSIS — R0683 Snoring: Secondary | ICD-10-CM

## 2017-10-08 DIAGNOSIS — R0681 Apnea, not elsewhere classified: Secondary | ICD-10-CM

## 2017-10-08 DIAGNOSIS — G4733 Obstructive sleep apnea (adult) (pediatric): Secondary | ICD-10-CM

## 2017-10-08 DIAGNOSIS — R519 Headache, unspecified: Secondary | ICD-10-CM

## 2017-10-13 DIAGNOSIS — R51 Headache: Secondary | ICD-10-CM

## 2017-10-13 DIAGNOSIS — R400 Somnolence: Secondary | ICD-10-CM | POA: Insufficient documentation

## 2017-10-13 DIAGNOSIS — R519 Headache, unspecified: Secondary | ICD-10-CM | POA: Insufficient documentation

## 2017-10-13 DIAGNOSIS — K219 Gastro-esophageal reflux disease without esophagitis: Secondary | ICD-10-CM | POA: Insufficient documentation

## 2017-10-13 DIAGNOSIS — R0681 Apnea, not elsewhere classified: Secondary | ICD-10-CM

## 2017-10-13 DIAGNOSIS — R0683 Snoring: Secondary | ICD-10-CM | POA: Insufficient documentation

## 2017-10-13 NOTE — Addendum Note (Signed)
Addended by: Larey Seat on: 10/13/2017 08:06 PM   Modules accepted: Orders

## 2017-10-13 NOTE — Procedures (Signed)
PATIENT'S NAME:  [name], [name] DOB:      Jan 28, 1946      MR#:    [id number]     DATE OF RECORDING: 10/08/2017 REFERRING M.D.:  [name], MD Study Performed:   Baseline Polysomnogram HISTORY:  Mrs. Terrace Arabia. Dyk is a 72 year old female patient with morning headaches, hypersensitivity to temperature changes, she is snoring, has cyclic fatigue. Recent cardiology work up was negative.  Diagnosis list of: Chest pains, CAD 2017, GERD, sleep related Headaches, Hyperlipidemia, HTN, Neurovascular disorder, Neutropenia, Shortness of breath, Stroke related to fibromuscular dysplasia. The patient endorsed the Epworth Sleepiness Scale at 12/24 points, the FSS at only 24/63 points.   The patient's weight 216 pounds with a height of 68 (inches), resulting in a BMI of 32.7 kg/m2. The patient's neck circumference measured 16 inches.  CURRENT MEDICATIONS: Aspirin, Vitamin D, Plavix, Nitrostat, Pravachol, Diovan-HCT, Drisdol, Lasix.   PROCEDURE:  This is a multichannel digital polysomnogram utilizing the Somnostar 11.2 system.  Electrodes and sensors were applied and monitored per AASM Specifications.   EEG, EOG, Chin and Limb EMG, were sampled at 200 Hz.  ECG, Snore and Nasal Pressure, Thermal Airflow, Respiratory Effort, CPAP Flow and Pressure, Oximetry was sampled at 50 Hz. Digital video and audio were recorded.      BASELINE STUDY: Lights Out was at 21:13 and Lights On at 04:59.  Total recording time (TRT) was 466.5 minutes, with a total sleep time (TST) of 374 minutes.  The patient's sleep latency was 37 minutes.  REM latency was 127 minutes.  The sleep efficiency was 80.2 %.     SLEEP ARCHITECTURE: WASO (Wake after sleep onset) was 70 minutes. There were 12.5 minutes in Stage N1, 256.5 minutes Stage N2, 49.5 minutes Stage N3 and 55.5 minutes in Stage REM.  The percentage of Stage N1 was 3.3%, Stage N2 was 68.6%, Stage N3 was 13.2% and Stage R (REM sleep) was 14.8%.   RESPIRATORY ANALYSIS:  There were a  total of 61 respiratory events:  39 obstructive apneas, 6 central apneas and 0 mixed apneas with a total of 45 apneas and an apnea index (AI) of 7.2 /hour. There were 16 hypopneas with a hypopnea index of 2.6 /hour. The patient also had 0 respiratory event related arousals (RERAs). The total APNEA/HYPOPNEA INDEX (AHI) was 9.8/hour and the total RESPIRATORY DISTURBANCE INDEX was 9.8 /hour.  24 events occurred in REM sleep and 30 events in NREM. The REM AHI was 25.9 /hour, versus a non-REM AHI of 7.0. The patient spent 69.5 minutes of total sleep time in the supine position and 305 minutes in non-supine. The supine AHI was 41.4 versus a non-supine AHI of 2.6.  OXYGEN SATURATION & C02:  The Wake baseline 02 saturation was 96%, with the lowest being 68%. Time spent below 89% saturation equaled 3 minutes.   PERIODIC LIMB MOVEMENTS:  The patient had a total of 5 Periodic Limb Movements.  The Periodic Limb Movement (PLM) index was 0.8 and the PLM Arousal index was 0.3/hour. The arousals were noted as: 40 were spontaneous, 2 were associated with PLMs, and 24 were associated with respiratory events.  Audio and video analysis did show phonations and vocalizations during REM sleep- the patient called out feeling someone pulled her wires. This occurred in REM sleep, lasted about 120 seconds at 23.44 PM. The patient went promptly back to sleep and did not leave the bed.  The patient took two bathroom breaks. Snoring was noted. EKG was in  keeping with normal sinus rhythm (NSR).  Post-study, the patient indicated that sleep in the laboratory was longer than usual.   IMPRESSION:  1. Mild Obstructive Sleep Apnea (OSA), strongly REM sleep and supine sleep accentuated, Overall AHI was 9.8/h.  2. Primary Snoring without hypoxemia. 3. 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 There were very few limb movements in REM sleep. Dysfunctions associated with sleep stages or arousal from  sleep  RECOMMENDATIONS:  1. Advise full-night, attended, CPAP titration study to optimize therapy for REM dependent apnea.  2. The patient should avoid supine sleep.    I certify that I have reviewed the entire raw data recording prior to the issuance of this report in accordance with the Standards of Accreditation of the American Academy of Sleep Medicine (AASM)   Larey Seat, MD     10-13-2017  Diplomat, American Board of Psychiatry and Neurology  Diplomat, American Board of [place] Director, [place] [facility] [employer]

## 2017-10-15 ENCOUNTER — Telehealth: Payer: Self-pay | Admitting: Neurology

## 2017-10-15 NOTE — Telephone Encounter (Signed)
Called the patient and spent quite a bit of time on the phone reviewing her sleep study with her and what the numbers meant. I have informed the patient I will send a copy of the study to her home. I advised pt that Dr. Brett Fairy reviewed their sleep study results and found that has mild sleep apnea and recommends that pt be treated with a cpap. Dr. Brett Fairy recommends that pt return for a repeat sleep study in order to properly titrate the cpap and ensure a good mask fit. Pt is agreeable to returning for a titration study. I advised pt that our sleep lab will file with pt's insurance and call pt to schedule the sleep study when we hear back from the pt's insurance regarding coverage of this sleep study. Pt verbalized understanding of results. Pt had no questions at this time but was encouraged to call back if questions arise.

## 2017-10-15 NOTE — Telephone Encounter (Signed)
-----   Message from Larey Seat, MD sent at 10/13/2017  8:06 PM EDT ----- Audio and video analysis did show phonations and vocalizations  during REM sleep- the patient called out feeling someone pulled  her wires. This occurred in REM sleep, lasted about 120 seconds  at 23.44 PM. The patient went promptly back to sleep and did not  leave the bed.  The patient took two bathroom breaks. Snoring was noted. EKG was in keeping with normal sinus rhythm  (NSR).  Post-study, the patient indicated that sleep in the laboratory  was longer than usual.   IMPRESSION:  1. Mild Obstructive Sleep Apnea (OSA), strongly REM sleep and  supine sleep accentuated, Overall AHI was 9.8/h.  2. Primary Snoring without hypoxemia. 3. 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 There  were very few limb movements in REM sleep. Dysfunctions  associated with sleep stages or arousal from sleep  RECOMMENDATIONS:  1. Advise full-night, attended, CPAP titration study to optimize  therapy for REM dependent apnea.  2. The patient should avoid supine sleep.

## 2017-10-24 ENCOUNTER — Telehealth: Payer: Self-pay

## 2017-10-24 NOTE — Telephone Encounter (Signed)
Have made multiple attempts to contact patient. Finally spoke with patient today.  Pt stated that she isn't available any time in September to schedule her CPAP study. Patient then states she'll call back to schedule sometime in October.

## 2018-04-28 IMAGING — US US EXTREM LOW VENOUS BILAT
1 series · 13 of 24 positions shown · non-contrast
Comparison: None.

CLINICAL DATA: Bilateral lower extremity pain and edema for the
past 4 weeks. History DVT. Evaluate for acute or chronic DVT.



[Series 1: us extrem low venous bilat · 0.06mm/px · 13 of 60 slices shown]
[im 1/60]
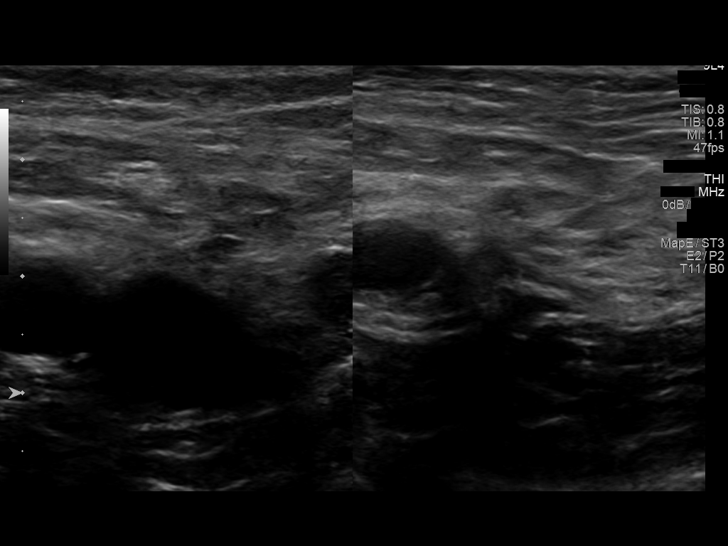
[im 6/60]
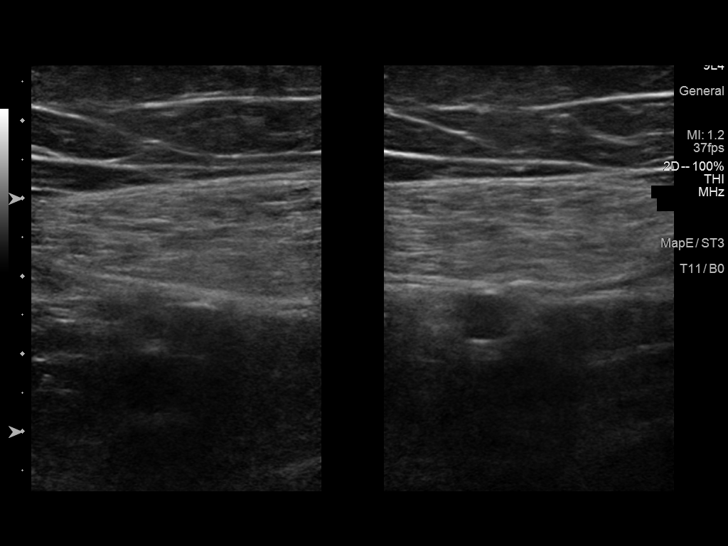
[im 11/60]
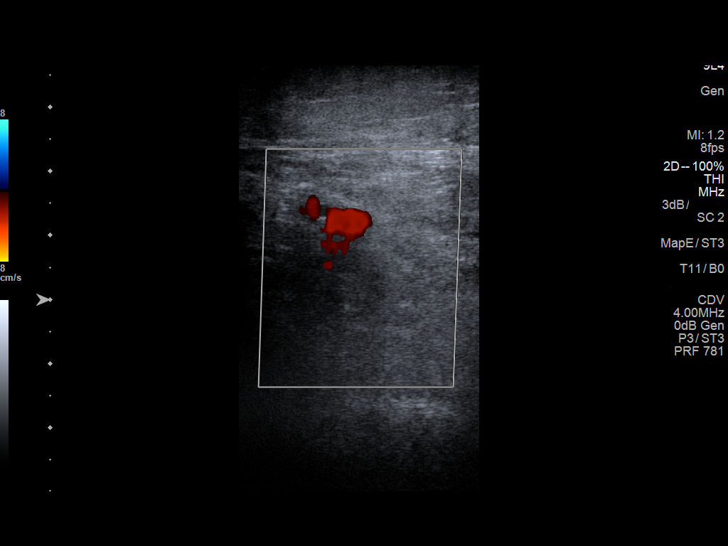
[im 16/60]
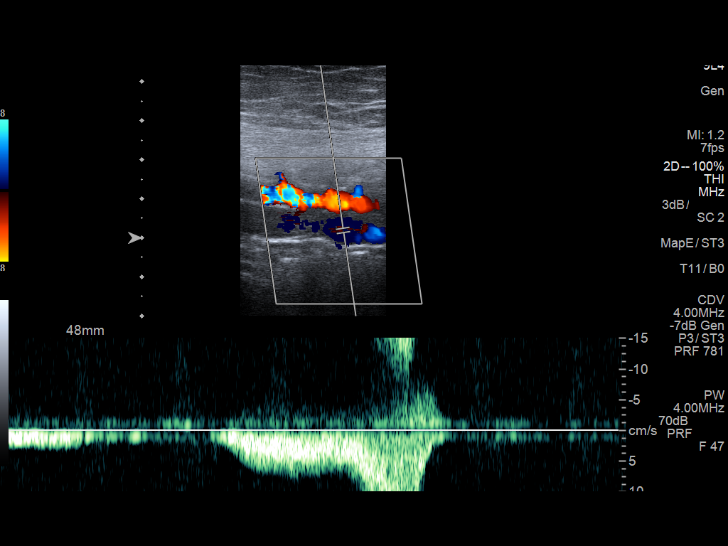
[im 21/60]
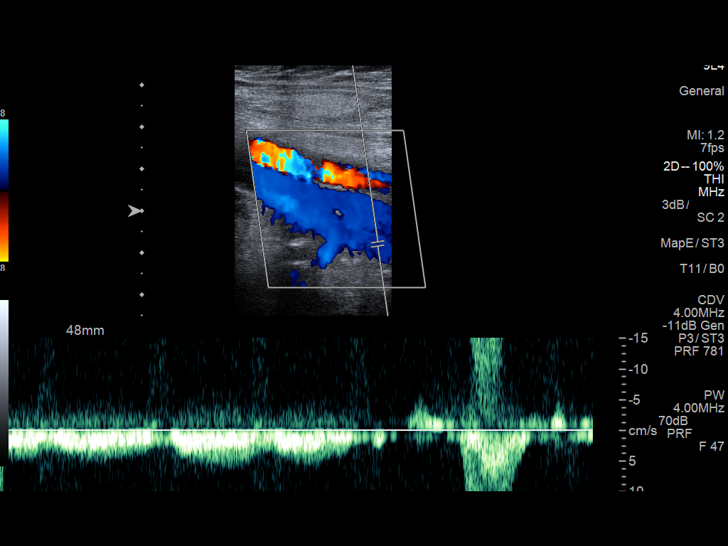
[im 26/60]
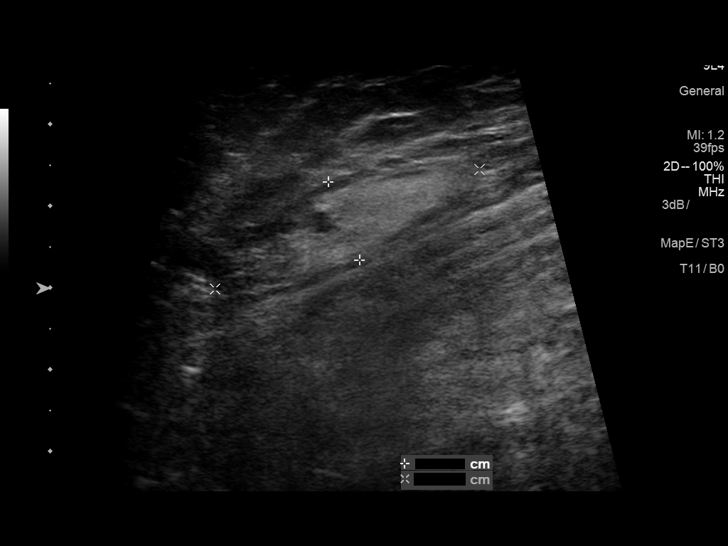
[im 31/60]
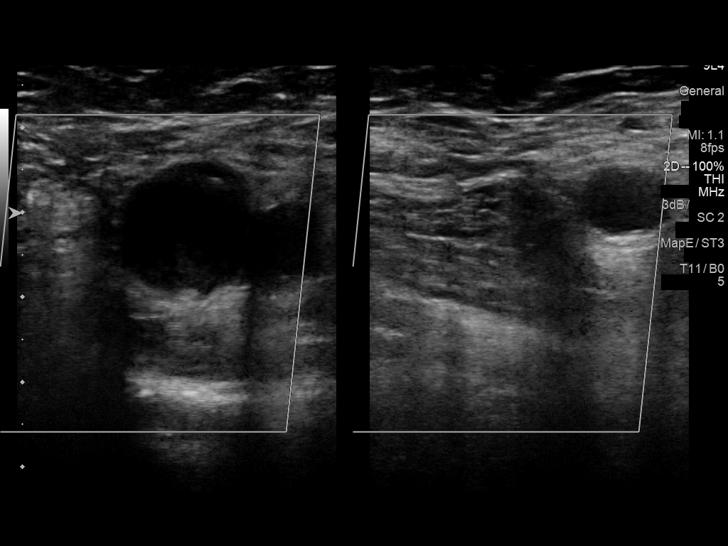
[im 34/60]
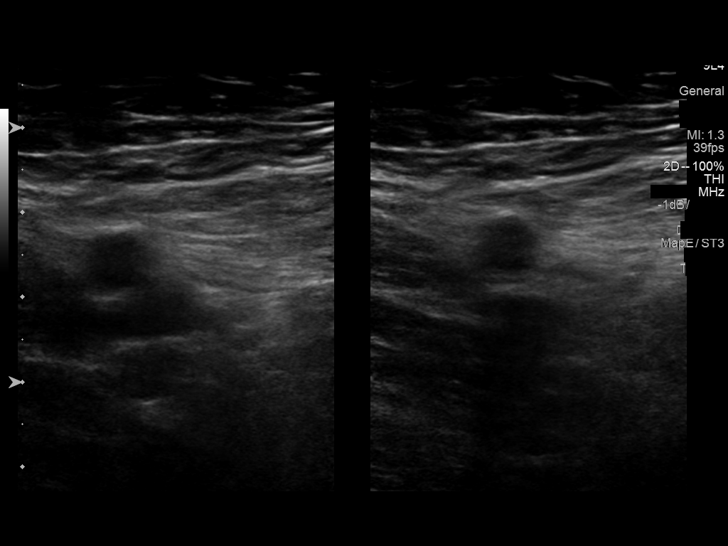
[im 39/60]
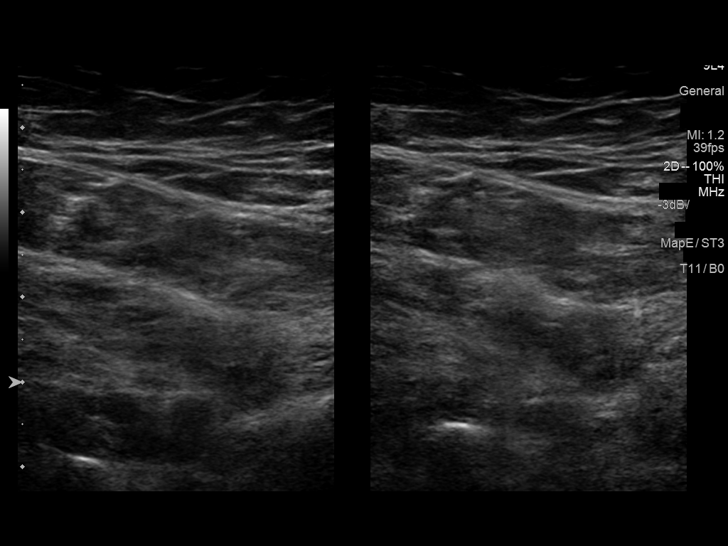
[im 44/60]
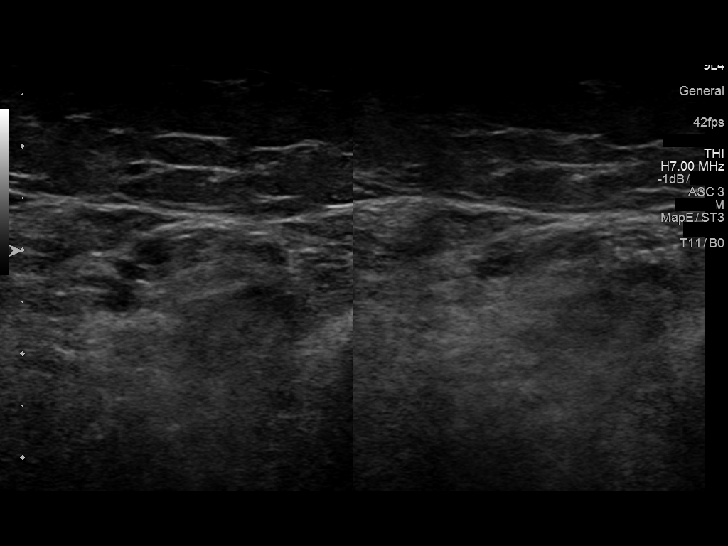
[im 49/60]
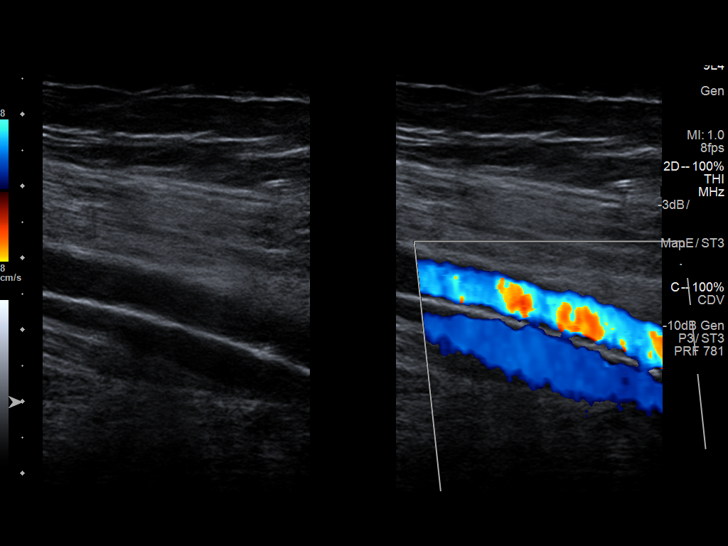
[im 54/60]
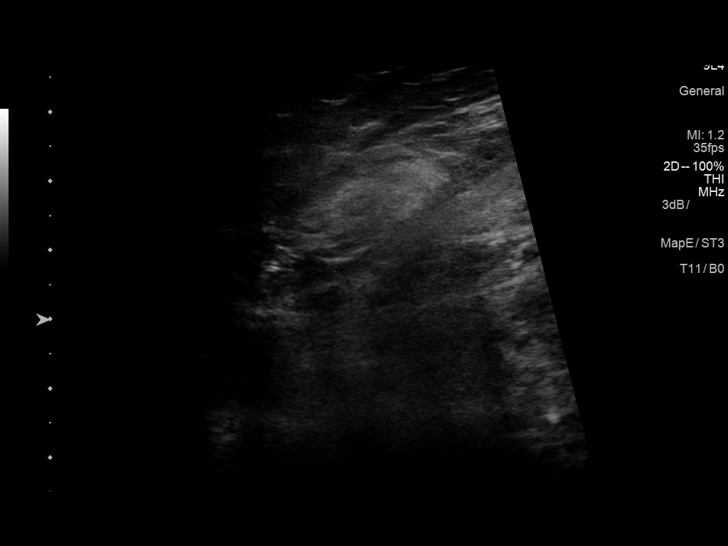
[im 60/60]
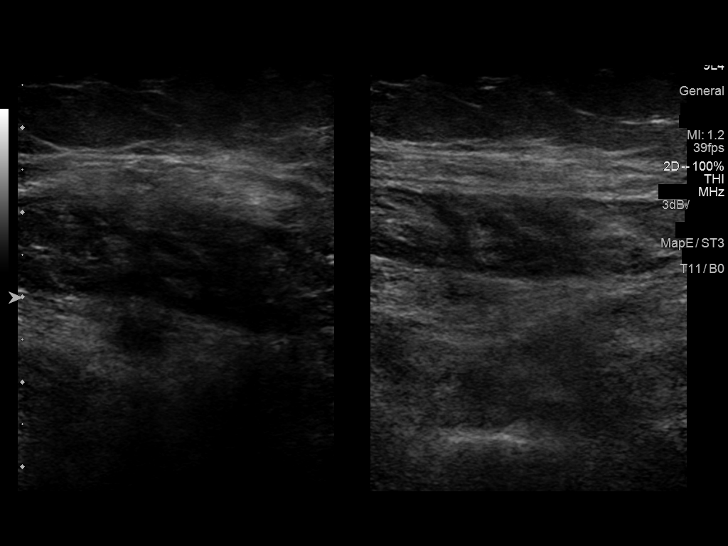

[13 of 24 positions shown; findings below may reference images not displayed]

FINDINGS: RIGHT LOWER EXTREMITY

Common Femoral Vein: No evidence of thrombus. Normal
compressibility, respiratory phasicity and response to augmentation.

Saphenofemoral Junction: No evidence of thrombus. Normal
compressibility and flow on color Doppler imaging.

Profunda Femoral Vein: No evidence of thrombus. Normal
compressibility and flow on color Doppler imaging.

Femoral Vein: No evidence of thrombus. Normal compressibility,
respiratory phasicity and response to augmentation.

Popliteal Vein: No evidence of thrombus. Normal compressibility,
respiratory phasicity and response to augmentation.

Calf Veins: No evidence of thrombus. Normal compressibility and flow
on color Doppler imaging.

Superficial Great Saphenous Vein: No evidence of thrombus. Normal
compressibility and flow on color Doppler imaging.

Venous Reflux:  None.

Other Findings: Benign appearing right inguinal lymph nodes with
dominant right inguinal lymph node measuring 1 cm in diameter with a
normal fatty hilum.

LEFT LOWER EXTREMITY

Common Femoral Vein: No evidence of thrombus. Normal
compressibility, respiratory phasicity and response to augmentation.

Saphenofemoral Junction: No evidence of thrombus. Normal
compressibility and flow on color Doppler imaging.

Profunda Femoral Vein: No evidence of thrombus. Normal
compressibility and flow on color Doppler imaging.

Femoral Vein: No evidence of thrombus. Normal compressibility,
respiratory phasicity and response to augmentation.

Popliteal Vein: No evidence of thrombus. Normal compressibility,
respiratory phasicity and response to augmentation.

Calf Veins: No evidence of thrombus. Normal compressibility and flow
on color Doppler imaging.

Superficial Great Saphenous Vein: No evidence of thrombus. Normal
compressibility and flow on color Doppler imaging.

Venous Reflux:  None.

Other Findings: Benign-appearing left inguinal lymph nodes with
dominant left inguinal lymph node measuring 1.3 cm in diameter and
maintaining a normal fatty hilum.
IMPRESSION: No evidence of DVT within either lower extremity.

## 2018-06-15 ENCOUNTER — Other Ambulatory Visit: Payer: Self-pay | Admitting: Cardiology

## 2018-06-15 DIAGNOSIS — R600 Localized edema: Secondary | ICD-10-CM

## 2018-06-15 DIAGNOSIS — E785 Hyperlipidemia, unspecified: Secondary | ICD-10-CM

## 2018-06-15 DIAGNOSIS — I10 Essential (primary) hypertension: Secondary | ICD-10-CM

## 2018-07-05 ENCOUNTER — Telehealth: Payer: Self-pay | Admitting: Physician Assistant

## 2018-07-05 NOTE — Telephone Encounter (Signed)
Patient set up for MyChart?  Yes -sent text message   Is patient using Smartphone/computer/tablet? smartphone   Did audio/video work?no  Does patient need telephone visit? No   Best phone number to [phone]  Special Instructions? pateint will have vitals ready       Virtual Visit Pre-Appointment Phone Call  "(Name), I am calling you today to discuss your upcoming appointment. We are currently trying to limit exposure to the virus that causes COVID-19 by seeing patients at home rather than in the office."  1. "What is the BEST phone number to call the day of the visit?" - include this in appointment notes  2. Do you have or have access to (through a family member/friend) a smartphone with video capability that we can use for your visit?" a. If yes - list this number in appt notes as cell (if different from BEST phone #) and list the appointment type as a VIDEO visit in appointment notes b. If no - list the appointment type as a PHONE visit in appointment notes  Confirm consent - "In the setting of the current Covid19 crisis, you are scheduled for a (phone or video) visit with your provider on (date) at (time).  Just as we do with many in-office visits, in order for you to participate in this visit, we must obtain consent.  If you'd like, I can send this to your mychart (if signed up) or email for you to review.  Otherwise, I can obtain your verbal consent now.  All virtual visits are billed to your insurance company just like a normal visit would be.  By agreeing to a virtual visit, we'd like you to understand that the technology does not allow for your provider to perform an examination, and thus may limit your provider's ability to fully assess your condition. If your provider identifies any concerns that need to be evaluated in person, we will make arrangements to do so.  Finally, though the technology is pretty good, we cannot assure that it will always work on either your or  our end, and in the setting of a video visit, we may have to convert it to a phone-only visit.  In either situation, we cannot ensure that we have a secure connection.  Are you willing to proceed?" STAFF: Did the patient verbally acknowledge consent to telehealth visit? Document YES/NO here: yes  3. Advise patient to be prepared - "Two hours prior to your appointment, go ahead and check your blood pressure, pulse, oxygen saturation, and your weight (if you have the equipment to check those) and write them all down. When your visit starts, your provider will ask you for this information. If you have an Apple Watch or Kardia device, please plan to have heart rate information ready on the day of your appointment. Please have a pen and paper handy nearby the day of the visit as well."  4. Give patient instructions for MyChart download to smartphone OR Doximity/Doxy.me as below if video visit (depending on what platform provider is using)  5. Inform patient they will receive a phone call 15 minutes prior to their appointment time (may be from unknown caller ID) so they should be prepared to answer    TELEPHONE CALL NOTE  [name] has been deemed a candidate for a follow-up tele-health visit to limit community exposure during the Covid-19 pandemic. I spoke with the patient via phone to ensure availability of phone/video source, confirm preferred email & phone number, and  discuss instructions and expectations.  I reminded [name] to be prepared with any vital sign and/or heart rhythm information that could potentially be obtained via home monitoring, at the time of her visit. I reminded [name] to expect a phone call prior to her visit.  [name] 07/05/2018 10:00 AM   INSTRUCTIONS FOR DOWNLOADING THE MYCHART APP TO SMARTPHONE  - The patient must first make sure to have activated MyChart and know their login information - If Apple, go to [employer] and type in MyChart in the  search bar and download the app. If Android, ask patient to go to [employer] and type in [place] in the search bar and download the app. The app is free but as with any other app downloads, their phone may require them to verify saved payment information or Apple/Android password.  - The patient will need to then log into the app with their MyChart username and password, and select [place] as their healthcare provider to link the account. When it is time for your visit, go to the MyChart app, find appointments, and click Begin Video Visit. Be sure to Select Allow for your device to access the Microphone and Camera for your visit. You will then be connected, and your provider will be with you shortly.  **If they have any issues connecting, or need assistance please contact MyChart service desk [phone])**  **If using a computer, in order to ensure the best quality for their visit they will need to use either of the following Internet Browsers: [employer], or Google Chrome**  IF USING DOXIMITY or DOXY.ME - The patient will receive a link just prior to their visit by text.     FULL LENGTH CONSENT FOR TELE-HEALTH VISIT   I hereby voluntarily request, consent and authorize [facility] [place] [facility] and its employed or contracted physicians, physician assistants, nurse practitioners or other licensed health care professionals (the Practitioner), to provide me with telemedicine health care services (the Services") as deemed necessary by the treating Practitioner. I acknowledge and consent to receive the Services by the Practitioner via telemedicine. I understand that the telemedicine visit will involve communicating with the Practitioner through live audiovisual communication technology and the disclosure of certain medical information by electronic transmission. I acknowledge that I have been given the opportunity to request an in-person assessment or other available alternative prior  to the telemedicine visit and am voluntarily participating in the telemedicine visit.  I understand that I have the right to withhold or withdraw my consent to the use of telemedicine in the course of my care at any time, without affecting my right to future care or treatment, and that the Practitioner or I may terminate the telemedicine visit at any time. I understand that I have the right to inspect all information obtained and/or recorded in the course of the telemedicine visit and may receive copies of available information for a reasonable fee.  I understand that some of the potential risks of receiving the Services via telemedicine include:   Delay or interruption in medical evaluation due to technological equipment failure or disruption;  Information transmitted may not be sufficient (e.g. poor resolution of images) to allow for appropriate medical decision making by the Practitioner; and/or   In rare instances, security protocols could fail, causing a breach of personal health information.  Furthermore, I acknowledge that it is my responsibility to provide information about my medical history, conditions and care that is complete and  accurate to the best of my ability. I acknowledge that Practitioner's advice, recommendations, and/or decision may be based on factors not within their control, such as incomplete or inaccurate data provided by me or distortions of diagnostic images or specimens that may result from electronic transmissions. I understand that the practice of medicine is not an exact science and that Practitioner makes no warranties or guarantees regarding treatment outcomes. I acknowledge that I will receive a copy of this consent concurrently upon execution via email to the email address I last provided but may also request a printed copy by calling the office of [facility].    I understand that my insurance will be billed for this visit.   I have read or had this consent read  to me.  I understand the contents of this consent, which adequately explains the benefits and risks of the Services being provided via telemedicine.   I have been provided ample opportunity to ask questions regarding this consent and the Services and have had my questions answered to my satisfaction.  I give my informed consent for the services to be provided through the use of telemedicine in my medical care  By participating in this telemedicine visit I agree to the above.

## 2018-07-08 ENCOUNTER — Telehealth (INDEPENDENT_AMBULATORY_CARE_PROVIDER_SITE_OTHER): Payer: Medicare Other | Admitting: Physician Assistant

## 2018-07-08 ENCOUNTER — Encounter: Payer: Self-pay | Admitting: Physician Assistant

## 2018-07-08 ENCOUNTER — Other Ambulatory Visit: Payer: Self-pay

## 2018-07-08 VITALS — BP 115/65 | HR 47 | Ht 68.0 in | Wt 214.0 lb

## 2018-07-08 DIAGNOSIS — R001 Bradycardia, unspecified: Secondary | ICD-10-CM

## 2018-07-08 DIAGNOSIS — I2510 Atherosclerotic heart disease of native coronary artery without angina pectoris: Secondary | ICD-10-CM | POA: Diagnosis not present

## 2018-07-08 DIAGNOSIS — R600 Localized edema: Secondary | ICD-10-CM

## 2018-07-08 DIAGNOSIS — I7771 Dissection of carotid artery: Secondary | ICD-10-CM

## 2018-07-08 MED ORDER — ATORVASTATIN CALCIUM 40 MG PO TABS: 40.0000 mg | ORAL_TABLET | Freq: Every day | ORAL | 3 refills | Status: DC

## 2018-07-08 NOTE — Patient Instructions (Addendum)
Medication Instructions:  Your physician has recommended you make the following change in your medication:  1.  STOP Pravastatin 2.  START Atorvastatin 40 mg taking 1 tablet nightly   Labwork: 3 MONTHS:  10/08/18 ARRIVE AT 9:00:  FASTING LIPID, CMET, & CBC  Testing/Procedures: None ordered  Follow-Up: Your physician recommends that you schedule a follow-up appointment in: [address], [place]    Any Other Special Instructions Will Be Listed Below (If Applicable).     If you need a refill on your cardiac medications before your next appointment, please call your pharmacy.

## 2018-07-08 NOTE — Progress Notes (Signed)
Virtual Visit via Video Note   This visit type was conducted due to national recommendations for restrictions regarding the COVID-19 Pandemic (e.g. social distancing) in an effort to limit this patient's exposure and mitigate transmission in our community.  Due to her co-morbid illnesses, this patient is at least at moderate risk for complications without adequate follow up.  This format is felt to be most appropriate for this patient at this time.  All issues noted in this document were discussed and addressed.  A limited physical exam was performed with this format.  Please refer to the patient's chart for her consent to telehealth for [facility].   Date:  07/08/2018   ID:  [name] 1946/01/31, MRN [id number]  Patient Location: Home Provider Location: Home  PCP:  [name], MD  Cardiologist:  [name], MD  Electrophysiologist:  None   Evaluation Performed:  Follow-Up Visit  Chief Complaint:  Overdue f/u CAD (last OV 01/2017)  History of Present Illness:    [name] is a 73 y.o. 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 She was last seen in 01/2017. Her last cath was in 2008 - this otherwise showed 50% residual Cx.  Last nuc in 06/2015 showed 33mm of horizontal ST depression that became downsloping in recovery, but otherwise low risk with normal perfusion imaging. 2D echo 07/2015 showed EF 76-19%, normal diastolic function, trivial AI. She had palpitations in 2017 with Holter showing frequent PVCs and bigeminy and sinus bradycardia almost entire monitoring time. Repeat 24-hour Holter 04/2016 showed  sinus bradycardia to sinus rhythm frequent PACs (0.58%), infrequent PVCs (0.2%), no AV blocks or pauses. Last carotid duplex 08/2016 showed 1-39% stenosis, otherwise OK. [facility], but last MRI 2017 showed no mention of prior CVA - she does have a hx of event at cardiac rehab in 2011 with facial droop felt due to cerebral hypoperfusion. Independent of this information, given her first generation DES remotely it has been recommended she continue Plavix indefinitely. She also has a history of mild oral bleeding and was advised she could stop ASA. Last labs 03/2018 showed normal AST/ALT, BUN 19, Cr 1.29, LDL 73, TSH wnl, 09/2017 albumin 4.1, K 4.8, 05/2017 Hgb 12.7, plt 173.  In general the patient states she is feeling good. She recalls a history of infrequent chest discomfort. The last episode was a mild one about a week ago. It lasted for several hours but did not really concern her. It was not brought on or made worse by activity. It was not associated with inspiration, palpation, food or movement. She did not feel the need to seek care and has felt just fine since that time with spontaneous resolution. During our visit she seemed to ambulate a lot on the video and even went up and down stairs. With this activity she said she felt perfectly normal and did not report CP. She was not SOB. She reports edema is well controlled. She does sometimes wakes up with blood in her mouth and is not sure where it's from. This is documented even back to 2015. She previously stopped her ASA by Dr. Meda Coffee and this  helped, but indicates her PCP had since told her to resume. She denies any other unusual bleeding. The patient does not have symptoms concerning for COVID-19 infection (fever, chills, cough, or new shortness of breath). She is unsure why she is only on pravastatin and denies hx of myalgias with other statins.   Past Medical History:  Diagnosis Date   Carotid artery dissection (HCC)    a.  remote LICA dissection.   CKD (chronic kidney disease), stage III (HCC)    Coronary artery disease    a. remote stents to LAD and large diagonal branch. b. PTCA of diag for ISR June 05, 2005. c. PTCA/DES to diagonal and PTCA of mid LAD 01/2007.   Fibromuscular dysplasia (HCC)    GERD (gastroesophageal reflux disease)    Headache(784.0)    hx of   Heart murmur    Hyperlipidemia    Hypertension    Internal hemorrhoids without mention of complication    Lower extremity edema    Lower extremity edema    Neutropenia, unspecified (HCC)    Obesity    Premature atrial contractions    PVC's (premature ventricular contractions)    Sinus brady-tachy syndrome ([place])    Stroke [facility])    Past Surgical History:  Procedure Laterality Date   ABDOMINAL HYSTERECTOMY     CARDIAC CATHETERIZATION  06-Jun-2006   Cardiac Stents  06-06-2006   COLONOSCOPY  2008/06/05   normal    gallstones removed     PARS PLANA VITRECTOMY  06/26/2011   Procedure: PARS PLANA VITRECTOMY WITH 23 GAUGE;  Surgeon: Adonis Brook, MD;  Location: [place];  Service: Ophthalmology;  Laterality: Left;     Current Meds  Medication Sig   aspirin EC 81 MG tablet Take 81 mg daily by mouth.   Cholecalciferol (VITAMIN D) 2000 units CAPS Take 1,000 Units by mouth daily.   clopidogrel (PLAVIX) 75 MG tablet Take 1 tablet (75 mg total) by mouth daily.   furosemide (LASIX) 40 MG tablet TAKE 1 TABLET (40 MG TOTAL) DAILY BY MOUTH.   nitroGLYCERIN (NITROSTAT) 0.4 MG SL tablet Place 0.4 mg under the tongue every 5 (five) minutes as needed. For chest pain   omeprazole (PRILOSEC) 40 MG capsule Take 40 mg by mouth daily.   pravastatin (PRAVACHOL) 40 MG tablet Take 40 mg by mouth daily.   valsartan-hydrochlorothiazide (DIOVAN-HCT) 80-12.5 MG tablet Take 1 tablet by mouth daily.   Vitamin D, Ergocalciferol, (DRISDOL) 50000 units CAPS capsule TAKE 1 CAPSULE EVERY WEEK   Current Facility-Administered Medications for the 07/08/18 encounter  (Telemedicine) with Charlie Pitter, PA-C  Medication   0.9 %  sodium chloride infusion     Allergies:   Fenoprofen calcium   Social History   Tobacco Use   Smoking status: Never Smoker   Smokeless tobacco: Never Used  Substance Use Topics   Alcohol use: No   Drug use: No     Family Hx: The patient's family history includes Breast cancer in her sister; Colon cancer in her mother; Heart attack in her father; Heart disease in her mother. There is no history of Anesthesia problems, Hypotension, Malignant hyperthermia, or Pseudochol deficiency.  ROS:   Please see the history of present illness.     All other systems reviewed and are negative.   Prior CV studies:   The following studies were reviewed today:  Most recent pertinent cardiac studies are outlined above. Cath 06/06/06: CINE FINDINGS:  Coronary cineangiography:  1. Left coronary artery:  The ostium and left  main appear normal.  2. Left anterior descending:  The LAD has two stents in the middle      segment proximal and distal to the takeoff of the anterolateral      branch.  The LAD now has a very good appearance without significant      restenosis within the stents.  The remainder of the middle segment      and distal LAD appear normal.  3. Large anterolateral branch:  This branch has a stent in its      proximal segment and there is a critical restenosis within the      stent with a restenosis of 95%.  There is antegrade flow.  4. Circumflex coronary artery:  The circumflex is a small nondominant      vessel.  There is an ostial 50% stenosis.  There is a normal      antegrade flow and this is a small nondominant vessel.  5. Right coronary artery:  The right coronary artery is a large      dominant vessel supplying the posterolateral and posterior      descending branches.  The right coronary artery is normal in      appearance with normal flow.  Labs/Other Tests and Data Reviewed:    EKG:  An ECG dated  01/08/17 was personally reviewed today and demonstrated:  sinus bradycardia 52bpm nonspecific TW changes  Recent Labs: No results found for requested labs within last 8760 hours.   Recent Lipid Panel Lab Results  Component Value Date/Time   CHOL 144 01/08/2017 01:09 PM   TRIG 80 01/08/2017 01:09 PM   HDL 61 01/08/2017 01:09 PM   CHOLHDL 2.4 01/08/2017 01:09 PM   CHOLHDL 2.3 07/07/2015 09:13 AM   LDLCALC 67 01/08/2017 01:09 PM    Wt Readings from Last 3 Encounters:  07/08/18 214 lb (97.1 kg)  08/09/17 216 lb (98 kg)  02/07/17 215 lb 9.6 oz (97.8 kg)     Objective:    Vital Signs:  BP 115/65    Pulse (!) 47    Ht 5\' 8"  (1.727 m)    Wt 214 lb (97.1 kg)    BMI 32.54 kg/m    VITAL SIGNS:  reviewed General - obese AAF in no acute distress HEENT - NCAT Pulm - No labored breathing, no coughing during visit, no audible wheezing, speaking in full sentences Neuro - A+Ox3, no slurred speech, answers questions appropriately Psych - Pleasant affect    ASSESSMENT & PLAN:    1. CAD - she has very infrequent chest discomfort. Stress test in 2017 showed ST depression but no evidence of ischemia and felt to be a low risk study. Her episode last week was somewhat atypical. She has since been able to exert herself around the house (I.e. during our call) without any recurrent symptoms. Discussed warning signs, and advised next time she seek medical attention for recurrent pain. BP tends to run on the lower side (107-120) so will continue current regimen and observe closely. Since Dr. Meda Coffee has previously given her permission to stop ASA, I asked her to call her PCP to inquire if they are OK with her stopping it (I do not have their records to indicate why she was advised to resume). She was also asked to discuss what kind of workup would be needed for mild longstanding oral bleeding. Since they prescribe her PPI, we also asked her to discuss changing omeprazole to alternative such as pantoprazole  since omeprazole  interferes with Plavix. We also discussed her cholesterol. She verbalizes a desire for [place] to manage this. I advised given her h/o CAD that we change pravastatin to atorvastation 40mg  nightly and recheck labs in 3 months (will get CMET and lipid profile, but also get CBC given chronic blood thinner therapy). 2. Sinus bradycardia - the patient reports a HR of 47 on her monitor today but denies any other associated symptoms. Lowest HR documented on prior event monitor was 45 and baseline seems around low 50s. She also has other times documented in the chart where she's been 44-46 so this seems to be stable. Continue to avoid AVN blocking agents. She will observe and call for any symptoms of dizziness, lightheadedness, syncope, confusion or falls. 3. Carotid artery disease (hx of dissection/FMD) - last duplex in 2018 was stable. There was no specific timeline mentioned in last cardiology note about when planned f/u was anticipated to be needed. She has not had any recent issues with this remote problem. In light of Covid pandemic I would be hesitant to proceed with routine surveillance at this time as her age and comorbidities would put her at increase risk for Covid complications. Will defer for now, and consider revisiting timing at f/u OV in 6 months. 4. Lower extremity edema - patient states this is well controlled on current regimen. If she requires increased dose of Lasix in the future, I would recommend to consider stopping the HCTZ component of her antihypertensive since the combination itself can produce more-than-expected diuresis. However, given that she's feeling good on current regimen, we'll continue.  COVID-19 Education: The signs and symptoms of COVID-19 were discussed with the patient and how to seek care for testing (follow up with PCP or arrange E-visit).  The importance of social distancing was discussed today.  Time:   Today, I have spent 25 minutes with the patient with  telehealth technology discussing the above problems.     Medication Adjustments/Labs and Tests Ordered: Current medicines are reviewed at length with the patient today.  Concerns regarding medicines are outlined above.   Disposition:  Follow up 6 months with Dr. Meda Coffee (sooner if symptoms recur) - needs to be in-person visit  Signed, Charlie Pitter, PA-C  07/08/2018 3:01 PM    [place]

## 2018-10-08 ENCOUNTER — Other Ambulatory Visit: Payer: Medicare Other

## 2018-10-31 ENCOUNTER — Other Ambulatory Visit: Payer: Medicare Other | Admitting: *Deleted

## 2018-10-31 ENCOUNTER — Other Ambulatory Visit: Payer: Self-pay

## 2018-10-31 DIAGNOSIS — R600 Localized edema: Secondary | ICD-10-CM

## 2018-10-31 DIAGNOSIS — I2510 Atherosclerotic heart disease of native coronary artery without angina pectoris: Secondary | ICD-10-CM

## 2018-10-31 DIAGNOSIS — I7771 Dissection of carotid artery: Secondary | ICD-10-CM

## 2018-10-31 DIAGNOSIS — R001 Bradycardia, unspecified: Secondary | ICD-10-CM

## 2018-10-31 LAB — CBC
Hematocrit: 33.1 % — ABNORMAL LOW (ref 34.0–46.6)
Hemoglobin: 11 g/dL — ABNORMAL LOW (ref 11.1–15.9)
MCH: 29 pg (ref 26.6–33.0)
MCHC: 33.2 g/dL (ref 31.5–35.7)
MCV: 87 fL (ref 79–97)
Platelets: 206 10*3/uL (ref 150–450)
RBC: 3.79 x10E6/uL (ref 3.77–5.28)
RDW: 14.6 % (ref 11.7–15.4)
WBC: 2.7 10*3/uL — ABNORMAL LOW (ref 3.4–10.8)

## 2018-10-31 LAB — COMPREHENSIVE METABOLIC PANEL
ALT: 14 IU/L (ref 0–32)
AST: 28 IU/L (ref 0–40)
Albumin/Globulin Ratio: 1.4 (ref 1.2–2.2)
Albumin: 4 g/dL (ref 3.7–4.7)
Alkaline Phosphatase: 95 IU/L (ref 39–117)
BUN/Creatinine Ratio: 15 (ref 12–28)
BUN: 20 mg/dL (ref 8–27)
Bilirubin Total: 0.8 mg/dL (ref 0.0–1.2)
CO2: 24 mmol/L (ref 20–29)
Calcium: 9.4 mg/dL (ref 8.7–10.3)
Chloride: 101 mmol/L (ref 96–106)
Creatinine, Ser: 1.37 mg/dL — ABNORMAL HIGH (ref 0.57–1.00)
GFR calc Af Amer: 44 mL/min/{1.73_m2} — ABNORMAL LOW (ref >59)
GFR calc non Af Amer: 38 mL/min/{1.73_m2} — ABNORMAL LOW (ref >59)
Globulin, Total: 2.9 g/dL (ref 1.5–4.5)
Glucose: 92 mg/dL (ref 65–99)
Potassium: 4.2 mmol/L (ref 3.5–5.2)
Sodium: 140 mmol/L (ref 134–144)
Total Protein: 6.9 g/dL (ref 6.0–8.5)

## 2018-10-31 LAB — LIPID PANEL
Chol/HDL Ratio: 2.3 ratio (ref 0.0–4.4)
Cholesterol, Total: 129 mg/dL (ref 100–199)
HDL: 55 mg/dL (ref >39)
LDL Chol Calc (NIH): 61 mg/dL (ref 0–99)
Triglycerides: 59 mg/dL (ref 0–149)
VLDL Cholesterol Cal: 13 mg/dL (ref 5–40)

## 2018-11-01 ENCOUNTER — Telehealth: Payer: Self-pay | Admitting: *Deleted

## 2018-11-01 DIAGNOSIS — D649 Anemia, unspecified: Secondary | ICD-10-CM

## 2018-11-01 DIAGNOSIS — D729 Disorder of white blood cells, unspecified: Secondary | ICD-10-CM

## 2018-11-01 NOTE — Telephone Encounter (Signed)
  Patient is returning call for lab results and to follow up on paperwork that was to be filled out

## 2018-11-01 NOTE — Telephone Encounter (Signed)
Call placed to pt re: lab results, left a message for pt to call back.  

## 2018-11-01 NOTE — Telephone Encounter (Signed)
-----   Message from Charlie Pitter, PA-C sent at 11/01/2018  8:58 AM EDT ----- Cr 1.45    Please let pt know labs stable. Prior Cr was 1.45 so this one is very similar. Her blood count shows mild anemia and low white blood cell count. Does she know if her PCP has ever done any workup of this or sent her to hematology? I did not see any heme notes in Epic. If not, would be happy to refer to hematology for input on why her blood counts appear this way. They have been low for many years. Dayna Dunn PA-C

## 2018-11-04 NOTE — Telephone Encounter (Signed)
Left another message for pt to call back re: lab results.

## 2018-11-04 NOTE — Telephone Encounter (Signed)
Unable to locate the pts placard paperwork she dropped off to check in "desk # [phone]." Filled out a new handicap placard for Dr Meda Coffee to fill out and be mailed to the pt, as she requested, tomorrow when she returns to the office. Pt is aware of this and very gracious for all the assistance provided.

## 2018-11-04 NOTE — Telephone Encounter (Signed)
Follow up: ° ° °Patient returning call  ° ° ° °

## 2018-11-04 NOTE — Telephone Encounter (Signed)
Call placed to pt re: lab results. Pt has already been made aware of her lab results. Pt is wanting to check and see if Dr. Meda Coffee has signed her Handicap Renewal as of yet, states she dropped it off the day she got her lab results. Pt aware I would send Dr. Meda Coffee / Karlene Einstein, LPN a message and they will reach out to her tomorrow. Pt grateful for the call.

## 2018-11-05 ENCOUNTER — Telehealth: Payer: Self-pay

## 2018-11-05 NOTE — Telephone Encounter (Signed)
Pt advised that her signed Disability Placard paperwork has been mailed to her... copy filed in the chart.

## 2018-11-07 ENCOUNTER — Telehealth: Payer: Self-pay | Admitting: Hematology and Oncology

## 2018-11-07 NOTE — Telephone Encounter (Signed)
[name] has been cld and scheduled to see Dr. Lindi Adie on 10/5 at 1pm. She's been made aware to arrive 15 minutes early.

## 2018-11-24 NOTE — Progress Notes (Signed)
[facility] NOTE  Patient Care Team: [name], MD as PCP - General (Internal Medicine) Dorothy Spark, MD as PCP - Cardiology (Cardiology)  CHIEF COMPLAINTS/PURPOSE OF CONSULTATION:  Newly diagnosed anemia and leukopenia   HISTORY OF PRESENTING ILLNESS:  [name] 73 y.o. female is here because of recent diagnosis of mild anemia and a history of leukopenia. WBC has ranged between 2.7-3.1 over the last 3 years. Labs on 10/31/2018 showed: WBC 2,700, Hg 11.0, HCT 33.1, MCV 87, creatinine 1.37. She presents to the clinic today for initial evaluation.   I reviewed her records extensively and collaborated the history with the patient.  MEDICAL HISTORY:  Past Medical History:  Diagnosis Date  . Carotid artery dissection (HCC)    a. remote LICA dissection.  . CKD (chronic kidney disease), stage III ([place])   . Coronary artery disease    a. remote stents to LAD and large diagonal branch. b. PTCA of diag for ISR 2007. c. PTCA/DES to diagonal and PTCA of mid LAD 01/2007.  . Fibromuscular dysplasia ([place])   . GERD (gastroesophageal reflux disease)   . Headache(784.0)    hx of  . Heart murmur   . Hyperlipidemia   . Hypertension   . Internal hemorrhoids without mention of complication   . Lower extremity edema   . Lower extremity edema   . Neutropenia, unspecified ([place])   . Obesity   . Premature atrial contractions   . PVC's (premature ventricular contractions)   . 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 Stroke [facility])     SURGICAL HISTORY: Past Surgical History:  Procedure Laterality Date  . ABDOMINAL HYSTERECTOMY    . CARDIAC CATHETERIZATION  2008  . Cardiac Stents  2008  . COLONOSCOPY  2010   normal   . gallstones removed    . PARS PLANA VITRECTOMY  06/26/2011   Procedure: PARS PLANA VITRECTOMY WITH 23 GAUGE;  Surgeon: Adonis Brook, MD;  Location: [facility];  Service: Ophthalmology;  Laterality: Left;    SOCIAL HISTORY: Social History   Socioeconomic  History  . Marital status: Married    Spouse name: [name]  . Number of children: 1  . Years of education: 54  . Highest education level: Not on file  Occupational History  . Occupation: Retired  Scientific laboratory technician  . Financial resource strain: Not on file  . Food insecurity    Worry: Not on file    Inability: Not on file  . Transportation needs    Medical: Not on file    Non-medical: Not on file  Tobacco Use  . Smoking status: Never Smoker  . Smokeless tobacco: Never Used  Substance and Sexual Activity  . Alcohol use: No  . Drug use: No  . Sexual activity: Yes    Birth control/protection: Surgical  Lifestyle  . Physical activity    Days per week: Not on file    Minutes per session: Not on file  . Stress: Not on file  Relationships  . Social Herbalist on phone: Not on file    Gets together: Not on file    Attends religious service: Not on file    Active member of club or organization: Not on file    Attends meetings of clubs or organizations: Not on file    Relationship status: Not on file  . Intimate partner violence    Fear of current or ex partner: Not on file    Emotionally abused:  Not on file    Physically abused: Not on file    Forced sexual activity: Not on file  Other Topics Concern  . Not on file  Social History Narrative   Lives with husband   Caffeine use: Tea rare    FAMILY HISTORY: Family History  Problem Relation Age of Onset  . Heart disease Mother   . Colon cancer Mother   . Heart attack Father   . Breast cancer Sister        x 2  . Anesthesia problems Neg Hx   . Hypotension Neg Hx   . Malignant hyperthermia Neg Hx   . Pseudochol deficiency Neg Hx     ALLERGIES:  is allergic to fenoprofen calcium.  MEDICATIONS:  Current Outpatient Medications  Medication Sig Dispense Refill  . aspirin EC 81 MG tablet Take 81 mg daily by mouth.    Marland Kitchen atorvastatin (LIPITOR) 40 MG tablet Take 1 tablet (40 mg total) by mouth daily. 90 tablet 3  .  Cholecalciferol (VITAMIN D) 2000 units CAPS Take 1,000 Units by mouth daily.    . clopidogrel (PLAVIX) 75 MG tablet Take 1 tablet (75 mg total) by mouth daily. 90 tablet 3  . furosemide (LASIX) 40 MG tablet TAKE 1 TABLET (40 MG TOTAL) DAILY BY MOUTH. 90 tablet 1  . nitroGLYCERIN (NITROSTAT) 0.4 MG SL tablet Place 0.4 mg under the tongue every 5 (five) minutes as needed. For chest pain    . omeprazole (PRILOSEC) 40 MG capsule Take 40 mg by mouth daily.    . valsartan-hydrochlorothiazide (DIOVAN-HCT) 80-12.5 MG tablet Take 1 tablet by mouth daily.    . Vitamin D, Ergocalciferol, (DRISDOL) 50000 units CAPS capsule TAKE 1 CAPSULE EVERY WEEK  2   Current Facility-Administered Medications  Medication Dose Route Frequency Provider Last Rate Last Dose  . 0.9 %  sodium chloride infusion  500 mL Intravenous Continuous Armbruster, Carlota Raspberry, MD        REVIEW OF SYSTEMS:   Constitutional: Denies fevers, chills or abnormal night sweats Eyes: Denies blurriness of vision, double vision or watery eyes Ears, nose, mouth, throat, and face: Denies mucositis or sore throat Respiratory: Denies cough, dyspnea or wheezes Cardiovascular: Denies palpitation, chest discomfort or lower extremity swelling Gastrointestinal:  Denies nausea, heartburn or change in bowel habits Skin: Denies abnormal skin rashes Lymphatics: Denies new lymphadenopathy or easy bruising Neurological:Denies numbness, tingling or new weaknesses Behavioral/Psych: Mood is stable, no new changes  Breast: Denies any palpable lumps or discharge All other systems were reviewed with the patient and are negative.  PHYSICAL EXAMINATION: ECOG PERFORMANCE STATUS: 1 - Symptomatic but completely ambulatory  Vitals:   11/25/18 1258  BP: (!) 112/41  Pulse: (!) 53  Resp: 18  Temp: 98.3 F (36.8 C)  SpO2: 100%   Filed Weights   11/25/18 1258  Weight: 212 lb 4.8 oz (96.3 kg)    GENERAL:alert, no distress and comfortable SKIN: skin color,  texture, turgor are normal, no rashes or significant lesions EYES: normal, conjunctiva are pink and non-injected, sclera clear OROPHARYNX:no exudate, no erythema and lips, buccal mucosa, and tongue normal  NECK: supple, thyroid normal size, non-tender, without nodularity LYMPH:  no palpable lymphadenopathy in the cervical, axillary or inguinal LUNGS: clear to auscultation and percussion with normal breathing effort HEART: regular rate & rhythm and no murmurs and no lower extremity edema ABDOMEN:abdomen soft, non-tender and normal bowel sounds Musculoskeletal:no cyanosis of digits and no clubbing  PSYCH: alert & oriented x 3 with  fluent speech NEURO: no focal motor/sensory deficits  LABORATORY DATA:  I have reviewed the data as listed Lab Results  Component Value Date   WBC 2.7 (L) 10/31/2018   HGB 11.0 (L) 10/31/2018   HCT 33.1 (L) 10/31/2018   MCV 87 10/31/2018   PLT 206 10/31/2018   Lab Results  Component Value Date   NA 141 11/25/2018   K 3.8 11/25/2018   CL 104 11/25/2018   CO2 30 11/25/2018    RADIOGRAPHIC STUDIES: I have personally reviewed the radiological reports and agreed with the findings in the report.  ASSESSMENT AND PLAN:  NEUTROPENIA UNSPECIFIED Leukopenia dating back to at least 3 years 7 years ago the white count was 4.5 2017: WBC 3.1, hemoglobin 12.5 2019: WBC 2.9, hemoglobin 11.8 10/31/2018: WBC 2.7, hemoglobin 11 MCV 87  Differential diagnosis: 1. Medications: 4 of her medications which include Lasix, Diovan HCTZ, Lipitor and omeprazole could all cause mild leukopenia's. 2. viral illnesses: No symptoms of viral illnesses 3. Autoimmune conditions like lupus 4. 0000000 or folic acid deficiencies 5. Bone marrow disorders: Because the rest of the CBC is normal I do not suspect any bone marrow failure syndrome. 6. Cyclical neutropenia 7. Ethnicity related    Recommendation: 1. Recheck CBC with differential  2. 0000000 and folic acid levels 3. ANA   For  the mild normocytic anemia, we will also check iron studies.  If the above tests are normal, then it could indicate ethnicity related.   we will see her back in 6 months with labs and follow-up.   All questions were answered. The patient knows to call the clinic with any problems, questions or concerns.   Rulon Eisenmenger, MD 11/25/2018    I, [name], am acting as scribe for Nicholas Lose, MD.  I have reviewed the above documentation for accuracy and completeness, and I agree with the above.

## 2018-11-25 ENCOUNTER — Inpatient Hospital Stay: Payer: Medicare Other

## 2018-11-25 ENCOUNTER — Other Ambulatory Visit: Payer: Self-pay

## 2018-11-25 ENCOUNTER — Inpatient Hospital Stay: Payer: Medicare Other | Attending: Hematology and Oncology | Admitting: Hematology and Oncology

## 2018-11-25 VITALS — BP 112/41 | HR 53 | Temp 98.3°F | Resp 18 | Ht 68.0 in | Wt 212.3 lb

## 2018-11-25 DIAGNOSIS — K219 Gastro-esophageal reflux disease without esophagitis: Secondary | ICD-10-CM | POA: Insufficient documentation

## 2018-11-25 DIAGNOSIS — E785 Hyperlipidemia, unspecified: Secondary | ICD-10-CM | POA: Insufficient documentation

## 2018-11-25 DIAGNOSIS — N183 Chronic kidney disease, stage 3 unspecified: Secondary | ICD-10-CM | POA: Insufficient documentation

## 2018-11-25 DIAGNOSIS — Z8673 Personal history of transient ischemic attack (TIA), and cerebral infarction without residual deficits: Secondary | ICD-10-CM | POA: Insufficient documentation

## 2018-11-25 DIAGNOSIS — Z7982 Long term (current) use of aspirin: Secondary | ICD-10-CM | POA: Insufficient documentation

## 2018-11-25 DIAGNOSIS — D709 Neutropenia, unspecified: Secondary | ICD-10-CM | POA: Diagnosis not present

## 2018-11-25 DIAGNOSIS — I2510 Atherosclerotic heart disease of native coronary artery without angina pectoris: Secondary | ICD-10-CM | POA: Insufficient documentation

## 2018-11-25 DIAGNOSIS — D649 Anemia, unspecified: Secondary | ICD-10-CM | POA: Diagnosis not present

## 2018-11-25 DIAGNOSIS — E669 Obesity, unspecified: Secondary | ICD-10-CM | POA: Insufficient documentation

## 2018-11-25 DIAGNOSIS — R011 Cardiac murmur, unspecified: Secondary | ICD-10-CM | POA: Insufficient documentation

## 2018-11-25 DIAGNOSIS — Z79899 Other long term (current) drug therapy: Secondary | ICD-10-CM | POA: Diagnosis not present

## 2018-11-25 DIAGNOSIS — D72819 Decreased white blood cell count, unspecified: Secondary | ICD-10-CM | POA: Insufficient documentation

## 2018-11-25 DIAGNOSIS — Z800 Family history of malignant neoplasm of digestive organs: Secondary | ICD-10-CM | POA: Insufficient documentation

## 2018-11-25 LAB — CMP (CANCER CENTER ONLY)
ALT: 17 U/L (ref 0–44)
AST: 23 U/L (ref 15–41)
Albumin: 3.6 g/dL (ref 3.5–5.0)
Alkaline Phosphatase: 99 U/L (ref 38–126)
Anion gap: 7 (ref 5–15)
BUN: 23 mg/dL (ref 8–23)
CO2: 30 mmol/L (ref 22–32)
Calcium: 9.2 mg/dL (ref 8.9–10.3)
Chloride: 104 mmol/L (ref 98–111)
Creatinine: 1.52 mg/dL — ABNORMAL HIGH (ref 0.44–1.00)
GFR, Est AFR Am: 39 mL/min — ABNORMAL LOW
GFR, Estimated: 34 mL/min — ABNORMAL LOW
Glucose, Bld: 128 mg/dL — ABNORMAL HIGH (ref 70–99)
Potassium: 3.8 mmol/L (ref 3.5–5.1)
Sodium: 141 mmol/L (ref 135–145)
Total Bilirubin: 0.8 mg/dL (ref 0.3–1.2)
Total Protein: 7.5 g/dL (ref 6.5–8.1)

## 2018-11-25 LAB — FOLATE: Folate: 30.5 ng/mL (ref >5.9)

## 2018-11-25 LAB — RETICULOCYTES
Immature Retic Fract: 8.8 % (ref 2.3–15.9)
RBC.: 3.69 MIL/uL — ABNORMAL LOW (ref 3.87–5.11)
Retic Count, Absolute: 44.3 10*3/uL (ref 19.0–186.0)
Retic Ct Pct: 1.2 % (ref 0.4–3.1)

## 2018-11-25 LAB — FERRITIN: Ferritin: 208 ng/mL (ref 11–307)

## 2018-11-25 LAB — IRON AND TIBC
Iron: 62 ug/dL (ref 41–142)
Saturation Ratios: 25 % (ref 21–57)
TIBC: 245 ug/dL (ref 236–444)
UIBC: 183 ug/dL (ref 120–384)

## 2018-11-25 LAB — VITAMIN B12: Vitamin B-12: 445 pg/mL (ref 180–914)

## 2018-11-25 NOTE — Assessment & Plan Note (Signed)
Leukopenia dating back to at least 3 years 7 years ago the white count was 4.5 2017: WBC 3.1, hemoglobin 12.5 2019: WBC 2.9, hemoglobin 11.8 10/31/2018: WBC 2.7, hemoglobin 11 MCV 87  Differential diagnosis: 1. Medications 2. viral illnesses 3. Autoimmune conditions like lupus 4. H-15 or folic acid deficiencies 5. Bone marrow disorders 6. Cyclical neutropenia 7. Ethnicity related    Recommendation: 1. Recheck CBC with differential  2. A-56 and folic acid levels 3. ANA    If the above tests are normal, then we may have to perform a bone marrow biopsy.

## 2018-11-26 LAB — ANTINUCLEAR ANTIBODIES, IFA: ANA Ab, IFA: NEGATIVE

## 2018-11-27 ENCOUNTER — Telehealth: Payer: Self-pay | Admitting: Hematology and Oncology

## 2018-11-27 ENCOUNTER — Ambulatory Visit (HOSPITAL_BASED_OUTPATIENT_CLINIC_OR_DEPARTMENT_OTHER): Payer: Medicare Other | Admitting: Hematology and Oncology

## 2018-11-27 DIAGNOSIS — D709 Neutropenia, unspecified: Secondary | ICD-10-CM

## 2018-11-27 NOTE — Progress Notes (Signed)
TELEPHONE VISIT  Patient Care Team: [name], MD as PCP - General (Internal Medicine) Dorothy Spark, MD as PCP - Cardiology (Cardiology)  DIAGNOSIS:  Encounter Diagnosis  Name Primary?  . Neutropenia, unspecified type ([place])     CHIEF COMPLIANT: Follow-up to review the blood work  INTERVAL HISTORY: [name] is a 72-year with above-mentioned history of leukopenia primarily neutropenia who underwent blood work to look for nutritional deficiencies and I am touching base with her on the telephone to discuss the results.  REVIEW OF SYSTEMS:   Constitutional: Denies fevers, chills or abnormal weight loss Eyes: Denies blurriness of vision Ears, nose, mouth, throat, and face: Denies mucositis or sore throat Respiratory: Denies cough, dyspnea or wheezes Cardiovascular: Denies palpitation, chest discomfort Gastrointestinal:  Denies nausea, heartburn or change in bowel habits Skin: Denies abnormal skin rashes Lymphatics: Denies new lymphadenopathy or easy bruising Neurological:Denies numbness, tingling or new weaknesses Behavioral/Psych: Mood is stable, no new changes  Extremities: No lower extremity edema   All other systems were reviewed with the patient and are negative.  I have reviewed the past medical history, past surgical history, social history and family history with the patient and they are unchanged from previous note.  ALLERGIES:  is allergic to fenoprofen calcium.  MEDICATIONS:  Current Outpatient Medications  Medication Sig Dispense Refill  . aspirin EC 81 MG tablet Take 81 mg daily by mouth.    Marland Kitchen atorvastatin (LIPITOR) 40 MG tablet Take 1 tablet (40 mg total) by mouth daily. 90 tablet 3  . Cholecalciferol (VITAMIN D) 2000 units CAPS Take 1,000 Units by mouth daily.    . clopidogrel (PLAVIX) 75 MG tablet Take 1 tablet (75 mg total) by mouth daily. 90 tablet 3  . furosemide (LASIX) 40 MG tablet TAKE 1 TABLET (40 MG TOTAL) DAILY BY MOUTH. 90 tablet 1  .  nitroGLYCERIN (NITROSTAT) 0.4 MG SL tablet Place 0.4 mg under the tongue every 5 (five) minutes as needed. For chest pain    . omeprazole (PRILOSEC) 40 MG capsule Take 40 mg by mouth daily.    . valsartan-hydrochlorothiazide (DIOVAN-HCT) 80-12.5 MG tablet Take 1 tablet by mouth daily.    . Vitamin D, Ergocalciferol, (DRISDOL) 50000 units CAPS capsule TAKE 1 CAPSULE EVERY WEEK  2   Current Facility-Administered Medications  Medication Dose Route Frequency Provider Last Rate Last Dose  . 0.9 %  sodium chloride infusion  500 mL Intravenous Continuous Armbruster, Carlota Raspberry, MD        PHYSICAL EXAMINATION: ECOG PERFORMANCE STATUS: 1 - Symptomatic but completely ambulatory     LABORATORY DATA:  I have reviewed the data as listed CMP Latest Ref Rng & Units 11/25/2018 10/31/2018 01/08/2017  Glucose 70 - 99 mg/dL 128(H) 92 94  BUN 8 - 23 mg/dL 23 20 19   Creatinine 0.44 - 1.00 mg/dL 1.52(H) 1.37(H) 1.10(H)  Sodium 135 - 145 mmol/L 141 140 143  Potassium 3.5 - 5.1 mmol/L 3.8 4.2 4.3  Chloride 98 - 111 mmol/L 104 101 103  CO2 22 - 32 mmol/L 30 24 27   Calcium 8.9 - 10.3 mg/dL 9.2 9.4 9.3  Total Protein 6.5 - 8.1 g/dL 7.5 6.9 7.0  Total Bilirubin 0.3 - 1.2 mg/dL 0.8 0.8 0.6  Alkaline Phos 38 - 126 U/L 99 95 92  AST 15 - 41 U/L 23 28 20   ALT 0 - 44 U/L 17 14 13     Lab Results  Component Value Date   WBC 2.7 (L) 10/31/2018  HGB 11.0 (L) 10/31/2018   HCT 33.1 (L) 10/31/2018   MCV 87 10/31/2018   PLT 206 10/31/2018   NEUTROABS 1.3 (L) 01/08/2017    ASSESSMENT & PLAN:  NEUTROPENIA UNSPECIFIED Leukopenia dating back to at least 3 years 7 years ago the white count was 4.5 2017: WBC 3.1, hemoglobin 12.5 2019: WBC 2.9, hemoglobin 11.8 10/31/2018: WBC 2.7, hemoglobin 11 MCV 87  Differential diagnosis: 1. Medications: 4 of her medications which include Lasix, Diovan HCTZ, Lipitor and omeprazole could all cause mild leukopenia's. 2. viral illnesses: No symptoms of viral illnesses 3.  Autoimmune conditions like lupus 4. 0000000 or folic acid deficiencies 5. Bone marrow disorders: Because the rest of the CBC is normal I do not suspect any bone marrow failure syndrome. 6. Cyclical neutropenia 7. Ethnicity related -------------------------------------------------- Lab results: [id number], folic acid [id number] ANA negative CMP revealed a creatinine 1.5 Reticulocyte count 1.2%  I suspect it is a medication that is causing her cytopenias.  Because we cannot stop any of those medications we will watch and monitor.  She has not had any increased risk of infections. RTC in 6 months with labs and follow-up.   No orders of the defined types were placed in this encounter.  The patient has a good understanding of the overall plan. she agrees with it. she will call with any problems that may develop before the next visit here.   Harriette Ohara, MD 11/27/18

## 2018-11-27 NOTE — Assessment & Plan Note (Signed)
Leukopenia dating back to at least 3 years 7 years ago the white count was 4.5 2017: WBC 3.1, hemoglobin 12.5 2019: WBC 2.9, hemoglobin 11.8 10/31/2018: WBC 2.7, hemoglobin 11 MCV 87  Differential diagnosis: 1. Medications: 4 of her medications which include Lasix, Diovan HCTZ, Lipitor and omeprazole could all cause mild leukopenia's. 2. viral illnesses: No symptoms of viral illnesses 3. Autoimmune conditions like lupus 4. 0000000 or folic acid deficiencies 5. Bone marrow disorders: Because the rest of the CBC is normal I do not suspect any bone marrow failure syndrome. 6. Cyclical neutropenia 7. Ethnicity related -------------------------------------------------- Lab results: [id number], folic acid [id number] ANA negative CMP revealed a creatinine 1.5 Reticulocyte count 1.2%

## 2018-11-27 NOTE — Telephone Encounter (Signed)
I talk with patient regarding phone visit and April

## 2018-12-16 ENCOUNTER — Other Ambulatory Visit: Payer: Self-pay | Admitting: Cardiology

## 2018-12-16 DIAGNOSIS — R600 Localized edema: Secondary | ICD-10-CM

## 2018-12-16 DIAGNOSIS — I10 Essential (primary) hypertension: Secondary | ICD-10-CM

## 2018-12-16 DIAGNOSIS — E785 Hyperlipidemia, unspecified: Secondary | ICD-10-CM

## 2019-01-07 ENCOUNTER — Ambulatory Visit: Payer: Medicare Other | Admitting: Cardiology

## 2019-01-08 ENCOUNTER — Encounter: Payer: Medicare Other | Admitting: Cardiology

## 2019-01-08 ENCOUNTER — Encounter: Payer: Self-pay | Admitting: Cardiology

## 2019-01-08 ENCOUNTER — Other Ambulatory Visit: Payer: Self-pay

## 2019-01-08 NOTE — Progress Notes (Signed)
This encounter was created in error - please disregard.

## 2019-01-09 ENCOUNTER — Ambulatory Visit: Payer: Medicare Other | Admitting: Cardiology

## 2019-01-09 ENCOUNTER — Encounter: Payer: Self-pay | Admitting: Cardiology

## 2019-01-09 VITALS — BP 118/68 | HR 53 | Ht 68.0 in | Wt 216.2 lb

## 2019-01-09 DIAGNOSIS — I5033 Acute on chronic diastolic (congestive) heart failure: Secondary | ICD-10-CM

## 2019-01-09 DIAGNOSIS — N183 Chronic kidney disease, stage 3 unspecified: Secondary | ICD-10-CM | POA: Diagnosis not present

## 2019-01-09 DIAGNOSIS — I2510 Atherosclerotic heart disease of native coronary artery without angina pectoris: Secondary | ICD-10-CM | POA: Diagnosis not present

## 2019-01-09 MED ORDER — FUROSEMIDE 20 MG PO TABS: 20.0000 mg | ORAL_TABLET | Freq: Every day | ORAL | 2 refills | Status: DC

## 2019-01-09 NOTE — Patient Instructions (Signed)
Medication Instructions:   DECREASE YOUR LASIX TO 20 MG ONCE DAILY  DR. Meda Coffee HAS PRESCRIBED FOR YOU TO WEAR COMPRESSION STOCKINGS FOR SWELLING.  PLEASE TAKE THIS PRESCRIPTION TO YOUR LOCAL MEDICAL SUPPLY STORE TO RECEIVE THEM.  *If you need a refill on your cardiac medications before your next appointment, please call your pharmacy*   You have been referred to Tatum Donato Heinz MD   Follow-Up: At [facility], you and your health needs are our priority.  As part of our continuing mission to provide you with exceptional heart care, we have created designated Provider Care Teams.  These Care Teams include your primary Cardiologist (physician) and Advanced Practice Providers (APPs -  Physician Assistants and Nurse Practitioners) who all work together to provide you with the care you need, when you need it.  Your next appointment:   6 month(s)  The format for your next appointment:   In Person  Provider:   [name], MD

## 2019-01-09 NOTE — Progress Notes (Signed)
Cardiology Office Note    Date:  01/09/2019   ID:  [name], [name] 07/28/1945, MRN [id number]  PCP:  [name], MD  Cardiologist: Dr Haroldine Laws -->  [name], MD   Chief compliant: Lower extremity edema  History of Present Illness:  [name] is a 73 y.o. female  with a history of hypertension, hyperlipidemia, fibromuscular dysplasia of the intracranial vessels, and previous left internal carotid artery dissection. She also has a history of coronary artery disease. She is status post previous stenting to the LAD and diagonal. Most recently, she had a Promus drug-eluting stent to the second diagonal in December 2008. Unable to tolerate b-blockers due to bradycardia.  In 2018  complained of dyspnea on exertion and underwent nuclear stress testing that was negative for prior infarct or ischemia and showed normal LVEF. She also underwent Holter monitoring for palpitations that showed frequent PVCs and bigeminy and sinus bradycardia almost the entire monitoring time list. The patient underwent repeat echocardiogram that showed normal LVEF grade 1 diastolic dysfunction and only trivial aortic regurgitation. Her LE edema has resolved with lasix. She otherwise denies any chest shortness of breath no orthopnea or proximal nocturnal dyspnea. No dizziness or falls.  01/09/2019 the patient is coming after 2 years, she has been doing well, she has stable angina on moderate exertion, that resolves quickly at rest, she never takes nitroglycerin, denies any palpitations dizziness or syncope.  She continues to have lower extremity edema.  She doesn't wear compression socks.  Past Medical History:  Diagnosis Date  . Carotid artery dissection (HCC)    a. remote LICA dissection.  . CKD (chronic kidney disease), stage III   . Coronary artery disease    a. remote stents to LAD and large diagonal branch. b. PTCA of diag for ISR 2007. c. PTCA/DES to diagonal and PTCA of mid LAD 01/2007.  .  Fibromuscular dysplasia ([place])   . GERD (gastroesophageal reflux disease)   . Headache(784.0)    hx of  . Heart murmur   . Hyperlipidemia   . Hypertension   . Internal hemorrhoids without mention of complication   . Lower extremity edema   . Lower extremity edema   . Neutropenia, unspecified ([place])   . Obesity   . Premature atrial contractions   . PVC's (premature ventricular contractions)   . Sinus brady-tachy syndrome ([place])   . Stroke [facility])     Past Surgical History:  Procedure Laterality Date  . ABDOMINAL HYSTERECTOMY    . CARDIAC CATHETERIZATION  2008  . Cardiac Stents  2008  . COLONOSCOPY  2010   normal   . gallstones removed    . PARS PLANA VITRECTOMY  06/26/2011   Procedure: PARS PLANA VITRECTOMY WITH 23 GAUGE;  Surgeon: Adonis Brook, MD;  Location: [place];  Service: Ophthalmology;  Laterality: Left;    Current Medications: Outpatient Medications Prior to Visit  Medication Sig Dispense Refill  . Cholecalciferol (VITAMIN D) 2000 units CAPS Take 1,000 Units by mouth daily.    . clopidogrel (PLAVIX) 75 MG tablet Take 1 tablet (75 mg total) by mouth daily. 90 tablet 3  . nitroGLYCERIN (NITROSTAT) 0.4 MG SL tablet Place 0.4 mg under the tongue every 5 (five) minutes as needed. For chest pain    . pantoprazole (PROTONIX) 40 MG tablet Take 40 mg by mouth daily.    . valsartan-hydrochlorothiazide (DIOVAN-HCT) 80-12.5 MG tablet Take 1 tablet by mouth daily.    . furosemide (LASIX) 40 MG tablet  TAKE 1 TABLET BY MOUTH EVERY DAY 90 tablet 1  . atorvastatin (LIPITOR) 40 MG tablet Take 1 tablet (40 mg total) by mouth daily. 90 tablet 3   Facility-Administered Medications Prior to Visit  Medication Dose Route Frequency Provider Last Rate Last Dose  . 0.9 %  sodium chloride infusion  500 mL Intravenous Continuous Armbruster, Carlota Raspberry, MD         Allergies:   Fenoprofen calcium   Social History   Socioeconomic History  . Marital status: Married    Spouse name: [name]  . Number  of children: 1  . Years of education: 48  . Highest education level: Not on file  Occupational History  . Occupation: Retired  Scientific laboratory technician  . Financial resource strain: Not on file  . Food insecurity    Worry: Not on file    Inability: Not on file  . Transportation needs    Medical: Not on file    Non-medical: Not on file  Tobacco Use  . Smoking status: Never Smoker  . Smokeless tobacco: Never Used  Substance and Sexual Activity  . Alcohol use: No  . Drug use: No  . Sexual activity: Yes    Birth control/protection: Surgical  Lifestyle  . Physical activity    Days per week: Not on file    Minutes per session: Not on file  . Stress: Not on file  Relationships  . Social Herbalist on phone: Not on file    Gets together: Not on file    Attends religious service: Not on file    Active member of club or organization: Not on file    Attends meetings of clubs or organizations: Not on file    Relationship status: Not on file  Other Topics Concern  . Not on file  Social History Narrative   Lives with husband   Caffeine use: Tea rare     Family History:  The patient's family history includes Breast cancer in her sister; Colon cancer in her mother; Heart attack in her father; Heart disease in her mother.   ROS:   Please see the history of present illness.    ROS All other systems reviewed and are negative.   PHYSICAL EXAM:   VS:  BP 118/68   Pulse (!) 53   Ht 5\' 8"  (1.727 m)   Wt 216 lb 3.2 oz (98.1 kg)   SpO2 99%   BMI 32.87 kg/m    GEN: Well nourished, well developed, in no acute distress  HEENT: normal  Neck: no JVD, carotid bruits, or masses Cardiac: RRR; 2/6 DIAST murmurs, rubs, or gallops, mild bilateral lower extremity edema only around ankles  Respiratory:  clear to auscultation bilaterally, normal work of breathing GI: soft, nontender, nondistended, + BS MS: no deformity or atrophy  Skin: warm and dry, no rash Neuro:  Alert and Oriented x 3,  Strength and sensation are intact Psych: euthymic mood, full affect  Wt Readings from Last 3 Encounters:  01/09/19 216 lb 3.2 oz (98.1 kg)  01/08/19 214 lb (97.1 kg)  11/25/18 212 lb 4.8 oz (96.3 kg)     Studies/Labs Reviewed:   EKG:  Performed today 01/08/2017 was personally reviewed and shows sinus bradycardia with nonspecific ST-T wave abnormalities, unchanged from prior.  Recent Labs: 10/31/2018: Hemoglobin 11.0; Platelets 206 11/25/2018: ALT 17; BUN 23; Creatinine 1.52; Potassium 3.8; Sodium 141   Lipid Panel    Component Value Date/Time   CHOL 129  10/31/2018 0906   TRIG 59 10/31/2018 0906   HDL 55 10/31/2018 0906   CHOLHDL 2.3 10/31/2018 0906   CHOLHDL 2.3 07/07/2015 0913   VLDL 13 07/07/2015 0913   LDLCALC 61 10/31/2018 0906    Additional studies/ records that were reviewed today include:   TTE: 06/2015 Left ventricle: The cavity size was normal. There was mild focal   basal hypertrophy of the septum. Systolic function was normal.   The estimated ejection fraction was in the range of 60% to 65%.   Wall motion was normal; there were no regional wall motion   abnormalities. Left ventricular diastolic function parameters   were normal. - Aortic valve: Calcified non coronary cusp. There was trivial   regurgitation..  Nuclear stress test: 06/2015  Nuclear stress EF: 71%.  There was 29mm of horizontal ST segment depression in the lateral precordial leads that became downsloping in recovery. Patient had submaximal exercise treadmill test and was change to a Lexiscan.  The nuclear images are normal.  This is a low risk study.  The left ventricular ejection fraction is hyperdynamic (>65%).   EKG today is no reviewed and shows sinus bradycardia 56 bpm, nonspecific T-wave abnormalities unchanged from prior.   ASSESSMENT:    1. Stage 3 chronic kidney disease, unspecified whether stage 3a or 3b CKD   2. Coronary artery disease involving native coronary artery of native  heart without angina pectoris   3. Acute on chronic diastolic CHF (congestive heart failure), NYHA class 3 (HCC)      PLAN:  In order of problems listed above:  1. CAD - Stable angina on moderate exertion, negative stress test in 2017, EKG is unchanged.  We'll continue the same management.   Plavix indefinitely, atorvastatin and valsartan.  2. HTN - Blood pressure well controlled. Continue current regimen.  3. HLP - as above  4. Lower extremity edema, we will prescribe compression socks, her creatinine has increased from 1.1-1.3-1.5. We will refer to nephrology.  Decrease Lasix from 40 to 20 mg daily.  Medication Adjustments/Labs and Tests Ordered: Current medicines are reviewed at length with the patient today.  Concerns regarding medicines are outlined above.  Medication changes, Labs and Tests ordered today are listed in the Patient Instructions below. Patient Instructions  Medication Instructions:   DECREASE YOUR LASIX TO 20 MG ONCE DAILY  DR. Meda Coffee HAS PRESCRIBED FOR YOU TO WEAR COMPRESSION STOCKINGS FOR SWELLING.  PLEASE TAKE THIS PRESCRIPTION TO YOUR LOCAL MEDICAL SUPPLY STORE TO RECEIVE THEM.  *If you need a refill on your cardiac medications before your next appointment, please call your pharmacy*   You have been referred to [place] Donato Heinz MD   Follow-Up: At [facility], you and your health needs are our priority.  As part of our continuing mission to provide you with exceptional heart care, we have created designated Provider Care Teams.  These Care Teams include your primary Cardiologist (physician) and Advanced Practice Providers (APPs -  Physician Assistants and Nurse Practitioners) who all work together to provide you with the care you need, when you need it.  Your next appointment:   6 month(s)  The format for your next appointment:   In Person  Provider:   [name], MD      Follow-up in 6  months.  Signed, [name], MD  01/09/2019 9:39 AM    [address], [place], [place]  [address] Phone: [phone])  [phone]; Fax: [phone]

## 2019-02-04 ENCOUNTER — Ambulatory Visit: Payer: Medicare Other | Admitting: Cardiology

## 2019-03-24 ENCOUNTER — Telehealth: Payer: Self-pay | Admitting: Cardiology

## 2019-03-24 NOTE — Telephone Encounter (Signed)
New message  Patient is calling in stating that she needs a letter of permission from Dr. Meda Coffee to get the covid-19 vaccine. Please fax letter to [phone].

## 2019-03-24 NOTE — Telephone Encounter (Signed)
Phone rang several times no answer and no way to leave message Will try later ./cy

## 2019-03-24 NOTE — Telephone Encounter (Signed)
New message  We are recommending the COVID-19 vaccine to all of our patients. Cardiac medications (including blood thinners) should not deter anyone from being vaccinated and there is no need to hold any of those medications prior to vaccine administration.     Currently, there is a hotline to call (active 02/28/19) to schedule vaccination appointments as no walk-ins will be accepted.   Number: [phone].    If an appointment is not available please go to [email or web address] to sign up for notification when additional vaccine appointments are available.   If you have further questions or concerns about the vaccine process, please visit [email or web address] or contact your primary care physician.

## 2019-03-25 ENCOUNTER — Encounter: Payer: Self-pay | Admitting: *Deleted

## 2019-03-25 NOTE — Telephone Encounter (Signed)
Faxed over clearance note to receive the covid vaccine to number requested, as mentioned below. Pt made aware this was faxed.  Pt verbalized understanding and gracious for all the assistance provided.     We are recommending the COVID-19 vaccine to all of our patients. Cardiac medications (including blood thinners) should not deter anyone from being vaccinated and there is no need to hold any of those medications prior to vaccine administration.

## 2019-04-02 DIAGNOSIS — H35371 Puckering of macula, right eye: Secondary | ICD-10-CM | POA: Diagnosis not present

## 2019-04-02 DIAGNOSIS — H35342 Macular cyst, hole, or pseudohole, left eye: Secondary | ICD-10-CM | POA: Diagnosis not present

## 2019-04-02 DIAGNOSIS — Z961 Presence of intraocular lens: Secondary | ICD-10-CM | POA: Diagnosis not present

## 2019-04-15 DIAGNOSIS — Z1231 Encounter for screening mammogram for malignant neoplasm of breast: Secondary | ICD-10-CM | POA: Diagnosis not present

## 2019-05-02 DIAGNOSIS — H35342 Macular cyst, hole, or pseudohole, left eye: Secondary | ICD-10-CM | POA: Diagnosis not present

## 2019-05-02 DIAGNOSIS — H35371 Puckering of macula, right eye: Secondary | ICD-10-CM | POA: Diagnosis not present

## 2019-05-02 DIAGNOSIS — Z961 Presence of intraocular lens: Secondary | ICD-10-CM | POA: Diagnosis not present

## 2019-05-02 DIAGNOSIS — D2310 Other benign neoplasm of skin of unspecified eyelid, including canthus: Secondary | ICD-10-CM | POA: Diagnosis not present

## 2019-05-20 DIAGNOSIS — H35342 Macular cyst, hole, or pseudohole, left eye: Secondary | ICD-10-CM | POA: Diagnosis not present

## 2019-05-20 DIAGNOSIS — Z961 Presence of intraocular lens: Secondary | ICD-10-CM | POA: Diagnosis not present

## 2019-05-20 DIAGNOSIS — H35371 Puckering of macula, right eye: Secondary | ICD-10-CM | POA: Diagnosis not present

## 2019-05-27 NOTE — Progress Notes (Signed)
Patient Care Team: [name], MD as PCP - General (Internal Medicine) Dorothy Spark, MD as PCP - Cardiology (Cardiology)  DIAGNOSIS:    ICD-10-CM   1. Neutropenia, unspecified type ([place])  D70.9     CHIEF COMPLIANT: Follow-up of neutropenia  INTERVAL HISTORY: [name] is a 74 y.o. with above-mentioned history of leukopenia, primarily neutropenia. She presents to the clinic today for follow-up.  She reports no new health issues or concerns.  She has been doing quite well.  Her major complaint today is that she is gaining weight.  She also tells me that she has been lazy and has not been doing any activity.  ALLERGIES:  is allergic to fenoprofen calcium.  MEDICATIONS:  Current Outpatient Medications  Medication Sig Dispense Refill  . atorvastatin (LIPITOR) 40 MG tablet Take 1 tablet (40 mg total) by mouth daily. 90 tablet 3  . Cholecalciferol (VITAMIN D) 2000 units CAPS Take 1,000 Units by mouth daily.    . clopidogrel (PLAVIX) 75 MG tablet Take 1 tablet (75 mg total) by mouth daily. 90 tablet 3  . furosemide (LASIX) 20 MG tablet Take 1 tablet (20 mg total) by mouth daily. 90 tablet 2  . nitroGLYCERIN (NITROSTAT) 0.4 MG SL tablet Place 0.4 mg under the tongue every 5 (five) minutes as needed. For chest pain    . pantoprazole (PROTONIX) 40 MG tablet Take 40 mg by mouth daily.    . valsartan-hydrochlorothiazide (DIOVAN-HCT) 80-12.5 MG tablet Take 1 tablet by mouth daily.     Current Facility-Administered Medications  Medication Dose Route Frequency Provider Last Rate Last Admin  . 0.9 %  sodium chloride infusion  500 mL Intravenous Continuous Armbruster, Carlota Raspberry, MD        PHYSICAL EXAMINATION: ECOG PERFORMANCE STATUS: 1 - Symptomatic but completely ambulatory  Vitals:   05/28/19 1348  BP: (!) 109/48  Pulse: (!) 52  Resp: 18  Temp: 97.8 F (36.6 C)  SpO2: 100%   Filed Weights   05/28/19 1348  Weight: 218 lb 3.2 oz (99 kg)    LABORATORY DATA:  I have  reviewed the data as listed CMP Latest Ref Rng & Units 11/25/2018 10/31/2018 01/08/2017  Glucose 70 - 99 mg/dL 128(H) 92 94  BUN 8 - 23 mg/dL '23 20 19  '$ Creatinine 0.44 - 1.00 mg/dL 1.52(H) 1.37(H) 1.10(H)  Sodium 135 - 145 mmol/L 141 140 143  Potassium 3.5 - 5.1 mmol/L 3.8 4.2 4.3  Chloride 98 - 111 mmol/L 104 101 103  CO2 22 - 32 mmol/L '30 24 27  '$ Calcium 8.9 - 10.3 mg/dL 9.2 9.4 9.3  Total Protein 6.5 - 8.1 g/dL 7.5 6.9 7.0  Total Bilirubin 0.3 - 1.2 mg/dL 0.8 0.8 0.6  Alkaline Phos 38 - 126 U/L 99 95 92  AST 15 - 41 U/L '23 28 20  '$ ALT 0 - 44 U/L '17 14 13    '$ Lab Results  Component Value Date   WBC 3.3 (L) 05/28/2019   HGB 11.0 (L) 05/28/2019   HCT 33.4 (L) 05/28/2019   MCV 89.1 05/28/2019   PLT 199 05/28/2019   NEUTROABS 1.6 (L) 05/28/2019    ASSESSMENT & PLAN:  NEUTROPENIA UNSPECIFIED Leukopenia dating back to at least 3 years 7 years ago the white count was 4.5 2017: WBC 3.1, hemoglobin 12.5 2019: WBC 2.9, hemoglobin 11.8 10/31/2018: WBC 2.7, hemoglobin 11 MCV 87 05/28/2019: WBC 3.3, ANC 1.6, hemoglobin 11  Work-up performed previously:  H41 937, folic acid 90.2 ANA  negative CMP revealed a creatinine 1.5 Reticulocyte count 1.2%  I suspect it is a medication that is causing her cytopenias.  Because we cannot stop any of those medications we will watch and monitor.  She has not had any increased risk of infections.  Based on fairly stable counts there is no indication for bone marrow biopsy.  I discussed with her that the white count may fluctuate up and down and there is no indication to do any major procedures unless she has more profound cytopenias are there is a clear trend identified.  I will see her back in 1 year for follow-up.   No orders of the defined types were placed in this encounter.  The patient has a good understanding of the overall plan. she agrees with it. she will call with any problems that may develop before the next visit here.  Total time  spent: 20 mins including face to face time and time spent for planning, charting and coordination of care  Nicholas Lose, MD 05/28/2019  I, Cloyde Reams Dorshimer, am acting as scribe for Dr. Nicholas Lose.  I have reviewed the above documentation for accuracy and completeness, and I agree with the above.

## 2019-05-28 ENCOUNTER — Inpatient Hospital Stay (HOSPITAL_BASED_OUTPATIENT_CLINIC_OR_DEPARTMENT_OTHER): Payer: Medicare PPO | Admitting: Hematology and Oncology

## 2019-05-28 ENCOUNTER — Inpatient Hospital Stay: Payer: Medicare PPO | Attending: Hematology and Oncology

## 2019-05-28 ENCOUNTER — Other Ambulatory Visit: Payer: Self-pay

## 2019-05-28 DIAGNOSIS — N1831 Chronic kidney disease, stage 3a: Secondary | ICD-10-CM | POA: Diagnosis not present

## 2019-05-28 DIAGNOSIS — D709 Neutropenia, unspecified: Secondary | ICD-10-CM

## 2019-05-28 DIAGNOSIS — K219 Gastro-esophageal reflux disease without esophagitis: Secondary | ICD-10-CM | POA: Diagnosis not present

## 2019-05-28 DIAGNOSIS — M7989 Other specified soft tissue disorders: Secondary | ICD-10-CM | POA: Diagnosis not present

## 2019-05-28 DIAGNOSIS — E785 Hyperlipidemia, unspecified: Secondary | ICD-10-CM | POA: Diagnosis not present

## 2019-05-28 DIAGNOSIS — Z79899 Other long term (current) drug therapy: Secondary | ICD-10-CM | POA: Insufficient documentation

## 2019-05-28 DIAGNOSIS — I773 Arterial fibromuscular dysplasia: Secondary | ICD-10-CM | POA: Diagnosis not present

## 2019-05-28 DIAGNOSIS — N1832 Chronic kidney disease, stage 3b: Secondary | ICD-10-CM | POA: Diagnosis not present

## 2019-05-28 LAB — CBC WITH DIFFERENTIAL (CANCER CENTER ONLY)
Abs Immature Granulocytes: 0.01 10*3/uL (ref 0.00–0.07)
Basophils Absolute: 0 10*3/uL (ref 0.0–0.1)
Basophils Relative: 0 %
Eosinophils Absolute: 0.1 10*3/uL (ref 0.0–0.5)
Eosinophils Relative: 2 %
HCT: 33.4 % — ABNORMAL LOW (ref 36.0–46.0)
Hemoglobin: 11 g/dL — ABNORMAL LOW (ref 12.0–15.0)
Immature Granulocytes: 0 %
Lymphocytes Relative: 38 %
Lymphs Abs: 1.2 10*3/uL (ref 0.7–4.0)
MCH: 29.3 pg (ref 26.0–34.0)
MCHC: 32.9 g/dL (ref 30.0–36.0)
MCV: 89.1 fL (ref 80.0–100.0)
Monocytes Absolute: 0.3 10*3/uL (ref 0.1–1.0)
Monocytes Relative: 10 %
Neutro Abs: 1.6 10*3/uL — ABNORMAL LOW (ref 1.7–7.7)
Neutrophils Relative %: 50 %
Platelet Count: 199 10*3/uL (ref 150–400)
RBC: 3.75 MIL/uL — ABNORMAL LOW (ref 3.87–5.11)
RDW: 15.9 % — ABNORMAL HIGH (ref 11.5–15.5)
WBC Count: 3.3 10*3/uL — ABNORMAL LOW (ref 4.0–10.5)
nRBC: 0 % (ref 0.0–0.2)

## 2019-05-28 NOTE — Assessment & Plan Note (Signed)
Leukopenia dating back to at least 3 years 7 years ago the white count was 4.5 2017: WBC 3.1, hemoglobin 12.5 2019: WBC 2.9, hemoglobin 11.8 10/31/2018: WBC 2.7, hemoglobin 11 MCV 87 05/28/2019:  Work-up performed previously:  [id number], folic acid [id number] ANA negative CMP revealed a creatinine 1.5 Reticulocyte count 1.2%  I suspect it is a medication that is causing her cytopenias.  Because we cannot stop any of those medications we will watch and monitor.  She has not had any increased risk of infections.

## 2019-06-03 ENCOUNTER — Other Ambulatory Visit: Payer: Self-pay | Admitting: Nephrology

## 2019-06-09 ENCOUNTER — Other Ambulatory Visit: Payer: Self-pay | Admitting: Nephrology

## 2019-06-09 DIAGNOSIS — I773 Arterial fibromuscular dysplasia: Secondary | ICD-10-CM

## 2019-06-09 DIAGNOSIS — R609 Edema, unspecified: Secondary | ICD-10-CM

## 2019-06-09 DIAGNOSIS — N1832 Chronic kidney disease, stage 3b: Secondary | ICD-10-CM

## 2019-06-10 ENCOUNTER — Other Ambulatory Visit: Payer: Self-pay

## 2019-06-10 ENCOUNTER — Ambulatory Visit
Admission: RE | Admit: 2019-06-10 | Discharge: 2019-06-10 | Disposition: A | Discharge status: Home / Self Care | Payer: Medicare PPO | Source: Ambulatory Visit | Attending: Nephrology | Admitting: Nephrology

## 2019-06-10 DIAGNOSIS — R600 Localized edema: Secondary | ICD-10-CM | POA: Diagnosis not present

## 2019-06-10 DIAGNOSIS — I773 Arterial fibromuscular dysplasia: Secondary | ICD-10-CM

## 2019-06-10 DIAGNOSIS — N1832 Chronic kidney disease, stage 3b: Secondary | ICD-10-CM

## 2019-06-10 DIAGNOSIS — R609 Edema, unspecified: Secondary | ICD-10-CM

## 2019-06-23 ENCOUNTER — Ambulatory Visit
Admission: RE | Admit: 2019-06-23 | Discharge: 2019-06-23 | Disposition: A | Discharge status: Home / Self Care | Payer: Medicare PPO | Source: Ambulatory Visit | Attending: Nephrology | Admitting: Nephrology

## 2019-06-23 ENCOUNTER — Other Ambulatory Visit: Payer: Self-pay

## 2019-06-23 DIAGNOSIS — R609 Edema, unspecified: Secondary | ICD-10-CM

## 2019-06-23 DIAGNOSIS — I773 Arterial fibromuscular dysplasia: Secondary | ICD-10-CM

## 2019-06-23 DIAGNOSIS — R198 Other specified symptoms and signs involving the digestive system and abdomen: Secondary | ICD-10-CM | POA: Diagnosis not present

## 2019-06-23 DIAGNOSIS — N1832 Chronic kidney disease, stage 3b: Secondary | ICD-10-CM

## 2019-06-23 MED ORDER — GADOBENATE DIMEGLUMINE 529 MG/ML IV SOLN
20.0000 mL | Freq: Once | INTRAVENOUS | Status: AC | PRN
  Administered 2019-06-23: 20 mL via INTRAVENOUS

## 2019-06-25 DIAGNOSIS — H52203 Unspecified astigmatism, bilateral: Secondary | ICD-10-CM | POA: Diagnosis not present

## 2019-06-25 DIAGNOSIS — I129 Hypertensive chronic kidney disease with stage 1 through stage 4 chronic kidney disease, or unspecified chronic kidney disease: Secondary | ICD-10-CM | POA: Diagnosis not present

## 2019-06-25 DIAGNOSIS — I2510 Atherosclerotic heart disease of native coronary artery without angina pectoris: Secondary | ICD-10-CM | POA: Diagnosis not present

## 2019-06-25 DIAGNOSIS — H524 Presbyopia: Secondary | ICD-10-CM | POA: Diagnosis not present

## 2019-06-25 DIAGNOSIS — I773 Arterial fibromuscular dysplasia: Secondary | ICD-10-CM | POA: Diagnosis not present

## 2019-06-25 DIAGNOSIS — N390 Urinary tract infection, site not specified: Secondary | ICD-10-CM | POA: Diagnosis not present

## 2019-06-25 DIAGNOSIS — N1831 Chronic kidney disease, stage 3a: Secondary | ICD-10-CM | POA: Diagnosis not present

## 2019-07-07 ENCOUNTER — Telehealth: Payer: Self-pay | Admitting: Cardiology

## 2019-07-07 NOTE — Telephone Encounter (Signed)
Dr. Burt Knack spoke with [place] and instructed them to hold Plavix 3-5 days prior to pulling.

## 2019-07-07 NOTE — Telephone Encounter (Signed)
1. What dental office are you calling from? [facility]   2. What is your office phone number? [phone]  3. What is your fax number? [phone]  4. What type of procedure is the patient having performed? Seven Extractions broke up into two appointments for right and left side will also need deep cleaning under the gum   5. What date is procedure scheduled or is the patient there now? TBD  (if the patient is at the dentist's office question goes to their cardiologist if he/she is in the office.  If not, question should go to the DOD).   6. What is your question (ex. Antibiotics prior to procedure, holding medication-we need to know how long dentist wants pt to hold med)? How long should clopidogrel (PLAVIX) 75 MG tablet be held prior

## 2019-07-17 ENCOUNTER — Other Ambulatory Visit: Payer: Self-pay | Admitting: Physician Assistant

## 2019-07-30 DIAGNOSIS — E559 Vitamin D deficiency, unspecified: Secondary | ICD-10-CM | POA: Diagnosis not present

## 2019-07-30 DIAGNOSIS — I10 Essential (primary) hypertension: Secondary | ICD-10-CM | POA: Diagnosis not present

## 2019-08-06 DIAGNOSIS — D649 Anemia, unspecified: Secondary | ICD-10-CM | POA: Diagnosis not present

## 2019-08-06 DIAGNOSIS — L989 Disorder of the skin and subcutaneous tissue, unspecified: Secondary | ICD-10-CM | POA: Diagnosis not present

## 2019-08-06 DIAGNOSIS — Z0000 Encounter for general adult medical examination without abnormal findings: Secondary | ICD-10-CM | POA: Diagnosis not present

## 2019-08-06 DIAGNOSIS — Z01419 Encounter for gynecological examination (general) (routine) without abnormal findings: Secondary | ICD-10-CM | POA: Diagnosis not present

## 2019-08-06 DIAGNOSIS — E7800 Pure hypercholesterolemia, unspecified: Secondary | ICD-10-CM | POA: Diagnosis not present

## 2019-08-06 DIAGNOSIS — I10 Essential (primary) hypertension: Secondary | ICD-10-CM | POA: Diagnosis not present

## 2019-08-06 DIAGNOSIS — I2510 Atherosclerotic heart disease of native coronary artery without angina pectoris: Secondary | ICD-10-CM | POA: Diagnosis not present

## 2019-08-11 DIAGNOSIS — D649 Anemia, unspecified: Secondary | ICD-10-CM | POA: Diagnosis not present

## 2019-08-21 ENCOUNTER — Ambulatory Visit: Payer: Medicare PPO | Admitting: Cardiology

## 2019-08-21 ENCOUNTER — Other Ambulatory Visit: Payer: Self-pay

## 2019-08-21 ENCOUNTER — Encounter: Payer: Self-pay | Admitting: Cardiology

## 2019-08-21 VITALS — BP 98/60 | HR 60 | Ht 68.0 in | Wt 220.0 lb

## 2019-08-21 DIAGNOSIS — E782 Mixed hyperlipidemia: Secondary | ICD-10-CM | POA: Diagnosis not present

## 2019-08-21 DIAGNOSIS — R600 Localized edema: Secondary | ICD-10-CM

## 2019-08-21 DIAGNOSIS — I10 Essential (primary) hypertension: Secondary | ICD-10-CM | POA: Diagnosis not present

## 2019-08-21 DIAGNOSIS — I2510 Atherosclerotic heart disease of native coronary artery without angina pectoris: Secondary | ICD-10-CM

## 2019-08-21 DIAGNOSIS — R011 Cardiac murmur, unspecified: Secondary | ICD-10-CM | POA: Diagnosis not present

## 2019-08-21 MED ORDER — FUROSEMIDE 40 MG PO TABS
40.0000 mg | ORAL_TABLET | Freq: Every day | ORAL | 3 refills | Status: DC
Start: 2019-08-21 — End: 2019-12-10

## 2019-08-21 MED ORDER — VALSARTAN 80 MG PO TABS
80.0000 mg | ORAL_TABLET | Freq: Every day | ORAL | 3 refills | Status: DC
Start: 2019-08-21 — End: 2020-08-30

## 2019-08-21 NOTE — Patient Instructions (Addendum)
Medication Instructions:  Your physician has recommended you make the following change in your medication: 1) STOP taking Diovan-HCT 2) START taking Diovan (valsartan) 80 mg daily  3) INCREASE Lasix (furosemide) to 40 mg daily *If you need a refill on your cardiac medications before your next appointment, please call your pharmacy*  Testing/Procedures: Your physician has requested that you have an echocardiogram. Echocardiography is a painless test that uses sound waves to create images of your heart. It provides your doctor with information about the size and shape of your heart and how well your heart's chambers and valves are working. This procedure takes approximately one hour. There are no restrictions for this procedure.   Follow-Up: At [facility], you and your health needs are our priority.  As part of our continuing mission to provide you with exceptional heart care, we have created designated Provider Care Teams.  These Care Teams include your primary Cardiologist (physician) and Advanced Practice Providers (APPs -  Physician Assistants and Nurse Practitioners) who all work together to provide you with the care you need, when you need it.  We recommend signing up for the patient portal called "MyChart".  Sign up information is provided on this After Visit Summary.  MyChart is used to connect with patients for Virtual Visits (Telemedicine).  Patients are able to view lab/test results, encounter notes, upcoming appointments, etc.  Non-urgent messages can be sent to your provider as well.   To learn more about what you can do with MyChart, go to [email or web address].    Your next appointment:   3 month(s)  The format for your next appointment:   In Person  Provider:   You may see [name], MD or one of the following Advanced Practice Providers on your designated Care Team:    Melina Copa, PA-C  Ermalinda Barrios, PA-C

## 2019-08-21 NOTE — Progress Notes (Addendum)
Cardiology Office Note    Date:  08/21/2019   ID:  Leslie, Hart 03/30/1945, MRN [id number]  PCP:  [name], MD  Cardiologist: Dr Haroldine Laws -->  [name], MD   Chief compliant: Lower extremity edema  History of Present Illness:  [name] is a 74 y.o. female  with a history of hypertension, hyperlipidemia, fibromuscular dysplasia of the intracranial vessels, and previous left internal carotid artery dissection. She also has a history of coronary artery disease. She is status post previous stenting to the LAD and diagonal. Most recently, she had a Promus drug-eluting stent to the second diagonal in December 2008. Unable to tolerate b-blockers due to bradycardia.  In 2018  complained of dyspnea on exertion and underwent nuclear stress testing that was negative for prior infarct or ischemia and showed normal LVEF. She also underwent Holter monitoring for palpitations that showed frequent PVCs and bigeminy and sinus bradycardia almost the entire monitoring time list. The patient underwent repeat echocardiogram that showed normal LVEF grade 1 diastolic dysfunction and only trivial aortic regurgitation. Her LE edema has resolved with lasix. She otherwise denies any chest shortness of breath no orthopnea or proximal nocturnal dyspnea. No dizziness or falls.  08/21/2019 -the patient is coming for follow-up, she has been doing well, denies any chest pain or shortness of breath, she has received call with vaccine without any side effects.  She continues to have lower extremity edema, it is better in the morning and gets worse during the day, she is not very active stays at home most days.  She use compression socks in winter but they are too warm and summer.  She has been compliant with her medications has no dizziness.  Past Medical History:  Diagnosis Date  . Carotid artery dissection (HCC)    a. remote LICA dissection.  . CKD (chronic kidney disease), stage III   . Coronary  artery disease    a. remote stents to LAD and large diagonal branch. b. PTCA of diag for ISR 2007. c. PTCA/DES to diagonal and PTCA of mid LAD 01/2007.  . Fibromuscular dysplasia ([place])   . GERD (gastroesophageal reflux disease)   . Headache(784.0)    hx of  . Heart murmur   . Hyperlipidemia   . Hypertension   . Internal hemorrhoids without mention of complication   . Lower extremity edema   . Lower extremity edema   . Neutropenia, unspecified ([place])   . Obesity   . Premature atrial contractions   . PVC's (premature ventricular contractions)   . Sinus brady-tachy syndrome ([place])   . Stroke [facility])     Past Surgical History:  Procedure Laterality Date  . ABDOMINAL HYSTERECTOMY    . CARDIAC CATHETERIZATION  2008  . Cardiac Stents  2008  . COLONOSCOPY  2010   normal   . gallstones removed    . PARS PLANA VITRECTOMY  06/26/2011   Procedure: PARS PLANA VITRECTOMY WITH 23 GAUGE;  Surgeon: Adonis Brook, MD;  Location: [place];  Service: Ophthalmology;  Laterality: Left;    Current Medications: Outpatient Medications Prior to Visit  Medication Sig Dispense Refill  . Acetaminophen-Codeine 300-30 MG tablet Take 1 tablet by mouth every 4 (four) hours as needed.    Marland Kitchen atorvastatin (LIPITOR) 40 MG tablet TAKE 1 TABLET BY MOUTH EVERY DAY 90 tablet 1  . Cholecalciferol (VITAMIN D) 2000 units CAPS Take 1,000 Units by mouth daily.    . clopidogrel (PLAVIX) 75 MG tablet Take 1  tablet (75 mg total) by mouth daily. 90 tablet 3  . ibuprofen (ADVIL) 600 MG tablet Take 600 mg by mouth every 4 (four) hours as needed.    . nitroGLYCERIN (NITROSTAT) 0.4 MG SL tablet Place 0.4 mg under the tongue every 5 (five) minutes as needed. For chest pain    . pantoprazole (PROTONIX) 40 MG tablet Take 40 mg by mouth daily.    . Vitamin D, Ergocalciferol, (DRISDOL) 1.25 MG (50000 UNIT) CAPS capsule Take 50,000 Units by mouth once a week.    . furosemide (LASIX) 20 MG tablet Take 1 tablet (20 mg total) by mouth daily. 90  tablet 2  . valsartan-hydrochlorothiazide (DIOVAN-HCT) 80-12.5 MG tablet Take 1 tablet by mouth daily.     Facility-Administered Medications Prior to Visit  Medication Dose Route Frequency Provider Last Rate Last Admin  . 0.9 %  sodium chloride infusion  500 mL Intravenous Continuous Armbruster, Carlota Raspberry, MD         Allergies:   Fenoprofen calcium   Social History   Socioeconomic History  . Marital status: Married    Spouse name: [name]  . Number of children: 1  . Years of education: 30  . Highest education level: Not on file  Occupational History  . Occupation: Retired  Tobacco Use  . Smoking status: Never Smoker  . Smokeless tobacco: Never Used  Vaping Use  . Vaping Use: Never used  Substance and Sexual Activity  . Alcohol use: No  . Drug use: No  . Sexual activity: Yes    Birth control/protection: Surgical  Other Topics Concern  . Not on file  Social History Narrative   Lives with husband   Caffeine use: Tea rare   Social Determinants of Health   Financial Resource Strain:   . Difficulty of Paying Living Expenses:   Food Insecurity:   . Worried About Charity fundraiser in the Last Year:   . Arboriculturist in the Last Year:   Transportation Needs:   . Film/video editor (Medical):   Marland Kitchen Lack of Transportation (Non-Medical):   Physical Activity:   . Days of Exercise per Week:   . Minutes of Exercise per Session:   Stress:   . Feeling of Stress :   Social Connections:   . Frequency of Communication with Friends and Family:   . Frequency of Social Gatherings with Friends and Family:   . Attends Religious Services:   . Active Member of Clubs or Organizations:   . Attends Archivist Meetings:   Marland Kitchen Marital Status:     Family History:  The patient's family history includes Breast cancer in her sister; Colon cancer in her mother; Heart attack in her father; Heart disease in her mother.   ROS:   Please see the history of present illness.    ROS All  other systems reviewed and are negative.  PHYSICAL EXAM:   VS:  BP 98/60   Pulse 60   Ht 5\' 8"  (1.727 m)   Wt 220 lb (99.8 kg)   SpO2 97%   BMI 33.45 kg/m    GEN: Well nourished, well developed, in no acute distress  HEENT: normal  Neck: no JVD, carotid bruits, or masses Cardiac: RRR; 3/6 systolic murmer, rubs, or gallops, mild bilateral lower extremity edema only around ankles  Respiratory:  clear to auscultation bilaterally, normal work of breathing GI: soft, nontender, nondistended, + BS MS: no deformity or atrophy  Skin: warm and dry,  no rash Neuro:  Alert and Oriented x 3, Strength and sensation are intact Psych: euthymic mood, full affect  Wt Readings from Last 3 Encounters:  08/21/19 220 lb (99.8 kg)  05/28/19 218 lb 3.2 oz (99 kg)  01/09/19 216 lb 3.2 oz (98.1 kg)     Studies/Labs Reviewed:   EKG:  Performed today 08/21/2019 it shows normal sinus rhythm 61 bpm, normal EKG unchanged from prior.  This was personally reviewed.  Recent Labs: 11/25/2018: ALT 17; BUN 23; Creatinine 1.52; Potassium 3.8; Sodium 141 05/28/2019: Hemoglobin 11.0; Platelet Count 199   Lipid Panel    Component Value Date/Time   CHOL 129 10/31/2018 0906   TRIG 59 10/31/2018 0906   HDL 55 10/31/2018 0906   CHOLHDL 2.3 10/31/2018 0906   CHOLHDL 2.3 07/07/2015 0913   VLDL 13 07/07/2015 0913   LDLCALC 61 10/31/2018 0906    Additional studies/ records that were reviewed today include:   TTE: 06/2015 Left ventricle: The cavity size was normal. There was mild focal   basal hypertrophy of the septum. Systolic function was normal.   The estimated ejection fraction was in the range of 60% to 65%.   Wall motion was normal; there were no regional wall motion   abnormalities. Left ventricular diastolic function parameters   were normal. - Aortic valve: Calcified non coronary cusp. There was trivial   regurgitation..  Nuclear stress test: 06/2015  Nuclear stress EF: 71%.  There was 14mm of  horizontal ST segment depression in the lateral precordial leads that became downsloping in recovery. Patient had submaximal exercise treadmill test and was change to a Lexiscan.  The nuclear images are normal.  This is a low risk study.  The left ventricular ejection fraction is hyperdynamic (>65%).   EKG today is no reviewed and shows sinus bradycardia 56 bpm, nonspecific T-wave abnormalities unchanged from prior.   ASSESSMENT:    1. Coronary artery disease involving native coronary artery of native heart without angina pectoris   2. Essential hypertension   3. Mixed hyperlipidemia   4. Lower extremity edema   5. Systolic murmur      PLAN:  In order of problems listed above:  CAD - Stable angina on moderate exertion, negative stress test in 2017, EKG is unchanged.  We'll continue the same management.  Plavix indefinitely, atorvastatin and valsartan.  HTN - Blood pressure rather low today, I will discontinue Diovan, and continue valsartan only.  Lower extremity edema -Increase Lasix to 40 mg daily, encouraged to walk daily, encouraged to perform ankle pumps and elevate legs when not active use compression socks, educated about low-sodium diet.  HLP - as above  Lower extremity edema, we will prescribe compression socks, her creatinine has increased from 1.1-1.3-1.5. She was seen by nephrology.  Increase Lasix to 40 mg daily, discontinue hydrochlorothiazide.  Systolic murmur -no significant valvular abnormality on the last echo in 2017 we will repeat.  Medication Adjustments/Labs and Tests Ordered: Current medicines are reviewed at length with the patient today.  Concerns regarding medicines are outlined above.  Medication changes, Labs and Tests ordered today are listed in the Patient Instructions below. Patient Instructions  Medication Instructions:  Your physician has recommended you make the following change in your medication: 1) STOP taking Diovan-HCT 2) START taking  Diovan (valsartan) 80 mg daily  3) INCREASE Lasix (furosemide) to 40 mg daily *If you need a refill on your cardiac medications before your next appointment, please call your pharmacy*  Testing/Procedures: Your physician  has requested that you have an echocardiogram. Echocardiography is a painless test that uses sound waves to create images of your heart. It provides your doctor with information about the size and shape of your heart and how well your heart's chambers and valves are working. This procedure takes approximately one hour. There are no restrictions for this procedure.   Follow-Up: At [facility], you and your health needs are our priority.  As part of our continuing mission to provide you with exceptional heart care, we have created designated Provider Care Teams.  These Care Teams include your primary Cardiologist (physician) and Advanced Practice Providers (APPs -  Physician Assistants and Nurse Practitioners) who all work together to provide you with the care you need, when you need it.  We recommend signing up for the patient portal called "MyChart".  Sign up information is provided on this After Visit Summary.  MyChart is used to connect with patients for Virtual Visits (Telemedicine).  Patients are able to view lab/test results, encounter notes, upcoming appointments, etc.  Non-urgent messages can be sent to your provider as well.   To learn more about what you can do with MyChart, go to [email or web address].    Your next appointment:   3 month(s)  The format for your next appointment:   In Person  Provider:   You may see [name], MD or one of the following Advanced Practice Providers on your designated Care Team:    Melina Copa, PA-C  Ermalinda Barrios, PA-C     Follow-up in 6 months.  Signed, [name], MD  08/21/2019 9:36 AM    [facility] [address], [place], [place]  [address] Phone: [phone]; Fax: [phone]

## 2019-09-08 DIAGNOSIS — R600 Localized edema: Secondary | ICD-10-CM | POA: Diagnosis not present

## 2019-09-08 DIAGNOSIS — M2041 Other hammer toe(s) (acquired), right foot: Secondary | ICD-10-CM | POA: Diagnosis not present

## 2019-09-08 DIAGNOSIS — R2689 Other abnormalities of gait and mobility: Secondary | ICD-10-CM | POA: Diagnosis not present

## 2019-09-08 DIAGNOSIS — M25471 Effusion, right ankle: Secondary | ICD-10-CM | POA: Diagnosis not present

## 2019-09-15 ENCOUNTER — Other Ambulatory Visit: Payer: Self-pay

## 2019-09-15 ENCOUNTER — Ambulatory Visit (HOSPITAL_COMMUNITY): Payer: Medicare PPO | Attending: Internal Medicine

## 2019-09-15 DIAGNOSIS — R011 Cardiac murmur, unspecified: Secondary | ICD-10-CM | POA: Diagnosis not present

## 2019-09-15 LAB — ECHOCARDIOGRAM COMPLETE
Area-P 1/2: 2.71 cm2
P 1/2 time: 647 msec
S' Lateral: 3 cm

## 2019-09-17 ENCOUNTER — Telehealth: Payer: Self-pay

## 2019-09-17 DIAGNOSIS — R011 Cardiac murmur, unspecified: Secondary | ICD-10-CM

## 2019-09-17 DIAGNOSIS — N183 Chronic kidney disease, stage 3 unspecified: Secondary | ICD-10-CM

## 2019-09-17 DIAGNOSIS — Z79899 Other long term (current) drug therapy: Secondary | ICD-10-CM

## 2019-09-17 DIAGNOSIS — R001 Bradycardia, unspecified: Secondary | ICD-10-CM

## 2019-09-17 DIAGNOSIS — R600 Localized edema: Secondary | ICD-10-CM

## 2019-09-17 NOTE — Telephone Encounter (Signed)
-----   Message from Sueanne Margarita, MD sent at 09/17/2019 11:53 AM EDT ----- Echo showed normal heart function, mildly leaky MV and AV with mild to moderately thickened of AV leaflets with no AS.  Compared to prior echo, MR and AR had increased.  Repeat echo in 1 year

## 2019-09-17 NOTE — Telephone Encounter (Signed)
Pt verbalized understanding of her Echo results and will plan for repeat in one year.

## 2019-09-18 NOTE — Telephone Encounter (Signed)
RE: repeat echo in one year per Meda Coffee Received: Today Jerlyn Ly, LPN 1-64-29 pt is aware

## 2019-09-18 NOTE — Telephone Encounter (Signed)
Order for echo to be repeated in one year placed in the system. Message sent to our Echo scheduler to call the pt back and arrange this for one year out.  Pt is also aware.

## 2019-10-02 DIAGNOSIS — I739 Peripheral vascular disease, unspecified: Secondary | ICD-10-CM | POA: Diagnosis not present

## 2019-10-22 DIAGNOSIS — L308 Other specified dermatitis: Secondary | ICD-10-CM | POA: Diagnosis not present

## 2019-10-22 DIAGNOSIS — I872 Venous insufficiency (chronic) (peripheral): Secondary | ICD-10-CM | POA: Diagnosis not present

## 2019-11-18 ENCOUNTER — Ambulatory Visit: Payer: Medicare PPO | Attending: Internal Medicine

## 2019-11-18 DIAGNOSIS — Z23 Encounter for immunization: Secondary | ICD-10-CM

## 2019-11-18 NOTE — Progress Notes (Signed)
   Covid-19 Vaccination Clinic  Name:  SHAMICKA INGA    MRN: [id number] DOB: 1945/08/28  11/18/2019  Ms. Schermerhorn was observed post Covid-19 immunization for 15 minutes without incident. She was provided with Vaccine Information Sheet and instruction to access the V-Safe system.   Ms. Haverstick was instructed to call 911 with any severe reactions post vaccine: Marland Kitchen Difficulty breathing  . Swelling of face and throat  . A fast heartbeat  . A bad rash all over body  . Dizziness and weakness

## 2019-12-10 ENCOUNTER — Encounter: Payer: Self-pay | Admitting: Cardiology

## 2019-12-10 ENCOUNTER — Ambulatory Visit: Payer: Medicare PPO | Admitting: Cardiology

## 2019-12-10 ENCOUNTER — Other Ambulatory Visit: Payer: Self-pay

## 2019-12-10 VITALS — BP 110/66 | HR 52 | Ht 68.0 in | Wt 217.0 lb

## 2019-12-10 DIAGNOSIS — R600 Localized edema: Secondary | ICD-10-CM | POA: Diagnosis not present

## 2019-12-10 DIAGNOSIS — R011 Cardiac murmur, unspecified: Secondary | ICD-10-CM | POA: Diagnosis not present

## 2019-12-10 DIAGNOSIS — I10 Essential (primary) hypertension: Secondary | ICD-10-CM | POA: Diagnosis not present

## 2019-12-10 DIAGNOSIS — I2510 Atherosclerotic heart disease of native coronary artery without angina pectoris: Secondary | ICD-10-CM | POA: Diagnosis not present

## 2019-12-10 DIAGNOSIS — E782 Mixed hyperlipidemia: Secondary | ICD-10-CM | POA: Diagnosis not present

## 2019-12-10 MED ORDER — FUROSEMIDE 20 MG PO TABS
20.0000 mg | ORAL_TABLET | Freq: Every day | ORAL | 2 refills | Status: DC
Start: 2019-12-10 — End: 2021-01-17

## 2019-12-10 NOTE — Patient Instructions (Signed)
Medication Instructions:   DECREASE YOUR FUROSEMIDE (LASIX) TO 20 MG BY MOUTH DAILY  *If you need a refill on your cardiac medications before your next appointment, please call your pharmacy*   Follow-Up: At [facility], you and your health needs are our priority.  As part of our continuing mission to provide you with exceptional heart care, we have created designated Provider Care Teams.  These Care Teams include your primary Cardiologist (physician) and Advanced Practice Providers (APPs -  Physician Assistants and Nurse Practitioners) who all work together to provide you with the care you need, when you need it.  We recommend signing up for the patient portal called "MyChart".  Sign up information is provided on this After Visit Summary.  MyChart is used to connect with patients for Virtual Visits (Telemedicine).  Patients are able to view lab/test results, encounter notes, upcoming appointments, etc.  Non-urgent messages can be sent to your provider as well.   To learn more about what you can do with MyChart, go to [email or web address].    Your next appointment:   6 month(s)  The format for your next appointment:   In Person  Provider:   [name], MD

## 2019-12-10 NOTE — Progress Notes (Signed)
Cardiology Office Note    Date:  12/10/2019   ID:  [name], [name] 1945-12-05, MRN [id number]  PCP:  [name], MD  Cardiologist: Dr Haroldine Laws -->  [name], MD   Chief compliant: 3 months follow-up for lower extremity edema.  History of Present Illness:  [name] is a 74 y.o. female  with a history of hypertension, hyperlipidemia, fibromuscular dysplasia of the intracranial vessels, and previous left internal carotid artery dissection. She also has a history of coronary artery disease. She is status post previous stenting to the LAD and diagonal. Most recently, she had a Promus drug-eluting stent to the second diagonal in December 2008. Unable to tolerate b-blockers due to bradycardia.  In 2018  complained of dyspnea on exertion and underwent nuclear stress testing that was negative for prior infarct or ischemia and showed normal LVEF. She also underwent Holter monitoring for palpitations that showed frequent PVCs and bigeminy and sinus bradycardia almost the entire monitoring time list.   At the last visit the patient complained of worsening lower extremity edema and her Lasix was increased to 40 mg daily.  She underwent repeat echocardiography that showed LVEF of 70%, global longitudinal strain -23%, aortic insufficiency mild to moderate and mild mitral regurgitation.  Today she states that her lower extremity edema has improved significantly, she admits to eating out frequently.  She remains active walking daily, volunteering at church with no signs of chest pain or shortness of breath.  She has lost 3 pounds since the last visit.  She denies any orthopnea proximal nocturnal dyspnea.  She wears compression socks.  She gets occasional palpitations without any associated symptoms, approximately once a month lasting less than a minute.  Past Medical History:  Diagnosis Date  . Carotid artery dissection (HCC)    a. remote LICA dissection.  . CKD (chronic kidney disease),  stage III ([place])   . Coronary artery disease    a. remote stents to LAD and large diagonal branch. b. PTCA of diag for ISR 2007. c. PTCA/DES to diagonal and PTCA of mid LAD 01/2007.  . Fibromuscular dysplasia ([place])   . GERD (gastroesophageal reflux disease)   . Headache(784.0)    hx of  . Heart murmur   . Hyperlipidemia   . Hypertension   . Internal hemorrhoids without mention of complication   . Lower extremity edema   . Lower extremity edema   . Neutropenia, unspecified ([place])   . Obesity   . Premature atrial contractions   . PVC's (premature ventricular contractions)   . Sinus brady-tachy syndrome ([place])   . Stroke [facility])    Past Surgical History:  Procedure Laterality Date  . ABDOMINAL HYSTERECTOMY    . CARDIAC CATHETERIZATION  2008  . Cardiac Stents  2008  . COLONOSCOPY  2010   normal   . gallstones removed    . PARS PLANA VITRECTOMY  06/26/2011   Procedure: PARS PLANA VITRECTOMY WITH 23 GAUGE;  Surgeon: Adonis Brook, MD;  Location: [facility];  Service: Ophthalmology;  Laterality: Left;   Current Medications: Outpatient Medications Prior to Visit  Medication Sig Dispense Refill  . Acetaminophen-Codeine 300-30 MG tablet Take 1 tablet by mouth every 4 (four) hours as needed.    Marland Kitchen atorvastatin (LIPITOR) 40 MG tablet TAKE 1 TABLET BY MOUTH EVERY DAY 90 tablet 1  . clopidogrel (PLAVIX) 75 MG tablet Take 1 tablet (75 mg total) by mouth daily. 90 tablet 3  . ibuprofen (ADVIL) 600 MG tablet Take  600 mg by mouth every 4 (four) hours as needed.    . nitroGLYCERIN (NITROSTAT) 0.4 MG SL tablet Place 0.4 mg under the tongue every 5 (five) minutes as needed. For chest pain    . pantoprazole (PROTONIX) 40 MG tablet Take 40 mg by mouth daily.    . valsartan (DIOVAN) 80 MG tablet Take 1 tablet (80 mg total) by mouth daily. 90 tablet 3  . Vitamin D, Ergocalciferol, (DRISDOL) 1.25 MG (50000 UNIT) CAPS capsule Take 50,000 Units by mouth once a week.    . furosemide (LASIX) 40 MG tablet Take 1 tablet  (40 mg total) by mouth daily. 90 tablet 3  . Cholecalciferol (VITAMIN D) 2000 units CAPS Take 1,000 Units by mouth daily. (Patient not taking: Reported on 12/10/2019)     Facility-Administered Medications Prior to Visit  Medication Dose Route Frequency Provider Last Rate Last Admin  . 0.9 %  sodium chloride infusion  500 mL Intravenous Continuous Armbruster, Carlota Raspberry, MD        Allergies:   Fenoprofen calcium   Social History   Socioeconomic History  . Marital status: Married    Spouse name: [name]  . Number of children: 1  . Years of education: 68  . Highest education level: Not on file  Occupational History  . Occupation: Retired  Tobacco Use  . Smoking status: Never Smoker  . Smokeless tobacco: Never Used  Vaping Use  . Vaping Use: Never used  Substance and Sexual Activity  . Alcohol use: No  . Drug use: No  . Sexual activity: Yes    Birth control/protection: Surgical  Other Topics Concern  . Not on file  Social History Narrative   Lives with husband   Caffeine use: Tea rare   Social Determinants of Health   Financial Resource Strain:   . Difficulty of Paying Living Expenses: Not on file  Food Insecurity:   . Worried About Charity fundraiser in the Last Year: Not on file  . Ran Out of Food in the Last Year: Not on file  Transportation Needs:   . Lack of Transportation (Medical): Not on file  . Lack of Transportation (Non-Medical): Not on file  Physical Activity:   . Days of Exercise per Week: Not on file  . Minutes of Exercise per Session: Not on file  Stress:   . Feeling of Stress : Not on file  Social Connections:   . Frequency of Communication with Friends and Family: Not on file  . Frequency of Social Gatherings with Friends and Family: Not on file  . Attends Religious Services: Not on file  . Active Member of Clubs or Organizations: Not on file  . Attends Archivist Meetings: Not on file  . Marital Status: Not on file    Family History:   The patient's family history includes Breast cancer in her sister; Colon cancer in her mother; Heart attack in her father; Heart disease in her mother.   ROS:   Please see the history of present illness.    ROS All other systems reviewed and are negative.  PHYSICAL EXAM:   VS:  BP 110/66   Pulse (!) 52   Ht 5\' 8"  (1.727 m)   Wt 217 lb (98.4 kg)   SpO2 99%   BMI 32.99 kg/m    GEN: Well nourished, well developed, in no acute distress  HEENT: normal  Neck: no JVD, carotid bruits, or masses Cardiac: RRR; 2/6 systolic murmur and 3  out of 6 diastolic murmur, rubs, or gallops, mild bilateral lower extremity edema   Respiratory:  clear to auscultation bilaterally, normal work of breathing GI: soft, nontender, nondistended, + BS MS: no deformity or atrophy  Skin: warm and dry, no rash Neuro:  Alert and Oriented x 3, Strength and sensation are intact Psych: euthymic mood, full affect  Wt Readings from Last 3 Encounters:  12/10/19 217 lb (98.4 kg)  08/21/19 220 lb (99.8 kg)  05/28/19 218 lb 3.2 oz (99 kg)    Studies/Labs Reviewed:   EKG:  Performed today 08/21/2019 it shows normal sinus rhythm 61 bpm, normal EKG unchanged from prior.  This was personally reviewed.  Recent Labs: 05/28/2019: Hemoglobin 11.0; Platelet Count 199   Lipid Panel    Component Value Date/Time   CHOL 129 10/31/2018 0906   TRIG 59 10/31/2018 0906   HDL 55 10/31/2018 0906   CHOLHDL 2.3 10/31/2018 0906   CHOLHDL 2.3 07/07/2015 0913   VLDL 13 07/07/2015 0913   LDLCALC 61 10/31/2018 0906    Additional studies/ records that were reviewed today include:   TTE: July 2021 1. Global longitudinal strain is -23.1%. Left ventricular ejection  fraction, by estimation, is 65 to 70%. The left ventricle has normal  function. The left ventricle has no regional wall motion abnormalities.  Left ventricular diastolic parameters were  normal.  2. Right ventricular systolic function is normal. The right ventricular  size  is normal. There is normal pulmonary artery systolic pressure.  3. There are 2 prominent jets of MR. Mild mitral valve regurgitation.  4. Compared to report from previous echo, aortic valve insufficiency is  more prominent. No images to compare. The aortic valve is tricuspid.  Aortic valve regurgitation is mild. Mild to moderate aortic valve  sclerosis/calcification is present, without  any evidence of aortic stenosis.   Nuclear stress test: 06/2015  Nuclear stress EF: 71%.  There was 71mm of horizontal ST segment depression in the lateral precordial leads that became downsloping in recovery. Patient had submaximal exercise treadmill test and was change to a Lexiscan.  The nuclear images are normal.  This is a low risk study.  The left ventricular ejection fraction is hyperdynamic (>65%).   EKG today is no reviewed and shows sinus bradycardia 56 bpm, nonspecific T-wave abnormalities unchanged from prior.   ASSESSMENT:    1. Essential hypertension   2. Lower extremity edema   3. Mixed hyperlipidemia   4. Coronary artery disease involving native coronary artery of native heart without angina pectoris   5. Systolic murmur     PLAN:  In order of problems listed above:  CAD -the patient is asymptomatic, very active, will continue same management with atorvastatin and valsartan.  HTN -blood pressure well controlled, she gets occasional orthostatic hypotension but no falls..  Lower extremity edema -Her edema has improved, creatinine is slightly up, I will decrease Lasix to 20 mg daily she is instructed about increasing or decreasing the dose as needed.  She is also educated about necessity of low-sodium diet and limiting eating out.  She is complementing her wearing her compression socks.Marland Kitchen  HLP -on high-dose atorvastatin, or lipids at goal in June 2021.  Systolic and diastolic murmur, her most recent echocardiogram showed mild to moderate aortic insufficiency and mild mitral  regurgitation.  We will follow.  Medication Adjustments/Labs and Tests Ordered: Current medicines are reviewed at length with the patient today.  Concerns regarding medicines are outlined above.  Medication changes, Labs and  Tests ordered today are listed in the Patient Instructions below. Patient Instructions  Medication Instructions:   DECREASE YOUR FUROSEMIDE (LASIX) TO 20 MG BY MOUTH DAILY  *If you need a refill on your cardiac medications before your next appointment, please call your pharmacy*   Follow-Up: At [facility], you and your health needs are our priority.  As part of our continuing mission to provide you with exceptional heart care, we have created designated Provider Care Teams.  These Care Teams include your primary Cardiologist (physician) and Advanced Practice Providers (APPs -  Physician Assistants and Nurse Practitioners) who all work together to provide you with the care you need, when you need it.  We recommend signing up for the patient portal called "MyChart".  Sign up information is provided on this After Visit Summary.  MyChart is used to connect with patients for Virtual Visits (Telemedicine).  Patients are able to view lab/test results, encounter notes, upcoming appointments, etc.  Non-urgent messages can be sent to your provider as well.   To learn more about what you can do with MyChart, go to [email or web address].    Your next appointment:   6 month(s)  The format for your next appointment:   In Person  Provider:   [name], MD       Follow-up in 6 months.  Signed, [name], MD  12/10/2019 10:05 AM    [address], [place], [place]  [address] Phone: [phone]; Fax: [phone]

## 2019-12-11 ENCOUNTER — Ambulatory Visit: Payer: Medicare PPO | Admitting: Cardiology

## 2019-12-25 DIAGNOSIS — E559 Vitamin D deficiency, unspecified: Secondary | ICD-10-CM | POA: Diagnosis not present

## 2019-12-25 DIAGNOSIS — E785 Hyperlipidemia, unspecified: Secondary | ICD-10-CM | POA: Diagnosis not present

## 2019-12-25 DIAGNOSIS — I129 Hypertensive chronic kidney disease with stage 1 through stage 4 chronic kidney disease, or unspecified chronic kidney disease: Secondary | ICD-10-CM | POA: Diagnosis not present

## 2019-12-25 DIAGNOSIS — N1831 Chronic kidney disease, stage 3a: Secondary | ICD-10-CM | POA: Diagnosis not present

## 2019-12-25 DIAGNOSIS — I773 Arterial fibromuscular dysplasia: Secondary | ICD-10-CM | POA: Diagnosis not present

## 2019-12-25 DIAGNOSIS — I2510 Atherosclerotic heart disease of native coronary artery without angina pectoris: Secondary | ICD-10-CM | POA: Diagnosis not present

## 2020-01-22 ENCOUNTER — Other Ambulatory Visit: Payer: Self-pay | Admitting: Cardiology

## 2020-02-03 DIAGNOSIS — I2510 Atherosclerotic heart disease of native coronary artery without angina pectoris: Secondary | ICD-10-CM | POA: Diagnosis not present

## 2020-02-03 DIAGNOSIS — E7800 Pure hypercholesterolemia, unspecified: Secondary | ICD-10-CM | POA: Diagnosis not present

## 2020-02-10 DIAGNOSIS — Z23 Encounter for immunization: Secondary | ICD-10-CM | POA: Diagnosis not present

## 2020-02-10 DIAGNOSIS — E7800 Pure hypercholesterolemia, unspecified: Secondary | ICD-10-CM | POA: Diagnosis not present

## 2020-02-10 DIAGNOSIS — I10 Essential (primary) hypertension: Secondary | ICD-10-CM | POA: Diagnosis not present

## 2020-02-10 DIAGNOSIS — I2510 Atherosclerotic heart disease of native coronary artery without angina pectoris: Secondary | ICD-10-CM | POA: Diagnosis not present

## 2020-02-10 DIAGNOSIS — K625 Hemorrhage of anus and rectum: Secondary | ICD-10-CM | POA: Diagnosis not present

## 2020-02-10 DIAGNOSIS — K219 Gastro-esophageal reflux disease without esophagitis: Secondary | ICD-10-CM | POA: Diagnosis not present

## 2020-02-10 DIAGNOSIS — N183 Chronic kidney disease, stage 3 unspecified: Secondary | ICD-10-CM | POA: Diagnosis not present

## 2020-02-23 ENCOUNTER — Encounter: Payer: Self-pay | Admitting: Nurse Practitioner

## 2020-03-11 ENCOUNTER — Ambulatory Visit: Payer: Medicare PPO | Admitting: Nurse Practitioner

## 2020-03-25 ENCOUNTER — Ambulatory Visit: Payer: Medicare PPO | Admitting: Nurse Practitioner

## 2020-04-06 ENCOUNTER — Ambulatory Visit: Payer: Medicare PPO | Admitting: Nurse Practitioner

## 2020-04-06 ENCOUNTER — Other Ambulatory Visit (INDEPENDENT_AMBULATORY_CARE_PROVIDER_SITE_OTHER): Payer: Medicare PPO

## 2020-04-06 ENCOUNTER — Other Ambulatory Visit: Payer: Self-pay

## 2020-04-06 ENCOUNTER — Encounter: Payer: Self-pay | Admitting: Nurse Practitioner

## 2020-04-06 ENCOUNTER — Telehealth: Payer: Self-pay

## 2020-04-06 VITALS — BP 110/64 | HR 66 | Ht 68.0 in | Wt 219.0 lb

## 2020-04-06 DIAGNOSIS — D649 Anemia, unspecified: Secondary | ICD-10-CM | POA: Diagnosis not present

## 2020-04-06 DIAGNOSIS — Z800 Family history of malignant neoplasm of digestive organs: Secondary | ICD-10-CM | POA: Diagnosis not present

## 2020-04-06 DIAGNOSIS — K625 Hemorrhage of anus and rectum: Secondary | ICD-10-CM | POA: Diagnosis not present

## 2020-04-06 LAB — CBC
HCT: 34.9 % — ABNORMAL LOW (ref 36.0–46.0)
Hemoglobin: 11.7 g/dL — ABNORMAL LOW (ref 12.0–15.0)
MCHC: 33.6 g/dL (ref 30.0–36.0)
MCV: 87.5 fl (ref 78.0–100.0)
Platelets: 178 10*3/uL (ref 150.0–400.0)
RBC: 3.99 Mil/uL (ref 3.87–5.11)
RDW: 16.3 % — ABNORMAL HIGH (ref 11.5–15.5)
WBC: 3 10*3/uL — ABNORMAL LOW (ref 4.0–10.5)

## 2020-04-06 MED ORDER — SUTAB 1479-225-188 MG PO TABS: 1.0000 | ORAL_TABLET | ORAL | 0 refills | Status: DC

## 2020-04-06 NOTE — Patient Instructions (Addendum)
If you are age 76 or older, your body mass index should be between 23-30. Your Body mass index is 33.3 kg/m. If this is out of the aforementioned range listed, please consider follow up with your Primary Care Provider.  If you are age 80 or younger, your body mass index should be between 19-25. Your Body mass index is 33.3 kg/m. If this is out of the aformentioned range listed, please consider follow up with your Primary Care Provider.   LABS:   Labwork has been ordered for you today.  Press "B" on the elevator. The lab is located at the first door on the left as you exit the elevator.  HEALTHCARE LAWS AND MY CHART RESULTS: Due to recent changes in healthcare laws, you may see the results of your imaging and laboratory studies on MyChart before your provider has had a chance to review them.   We understand that in some cases there may be results that are confusing or concerning to you. Not all laboratory results come back in the same time frame and the provider may be waiting for multiple results in order to interpret others.  Please give [place] 48 hours in order for your provider to thoroughly review all the results before contacting the office for clarification of your results.   You will be contacted by our office prior to your procedure for directions on holding your Plavix.  If you do not hear from our office 1 week prior to your scheduled procedure, please call [phone] to discuss.   PROCEDURES:  You have been scheduled for a colonoscopy. Please follow the written instructions given to you at your visit today. Please pick up your prep supplies at the pharmacy within the next 1-3 days. If you use inhalers (even only as needed), please bring them with you on the day of your procedure.  OVER THE COUNTER MEDICATION  Please purchase the following medications over the counter and take as directed:  Miralax. Dissolve one capful in 8 ounces of water and drink before bed.  Try to avoid  straining.   It was great seeing you today!  Thank you for entrusting me with your care and choosing [facility].  Noralyn Pick, CRNP

## 2020-04-06 NOTE — Telephone Encounter (Signed)
Request for surgical clearance:     Endoscopy Procedure  What type of surgery is being performed?     Colonoscopy  When is this surgery scheduled?     05/26/20  What type of clearance is required ?   Pharmacy  Are there any medications that need to be held prior to surgery and how long? Plavix 3-5 day hold  Practice name and name of physician performing surgery?      [facility]  What is your office phone and fax number?      Phone- [phone]  [phone]  Anesthesia type (None, local, MAC, general) ?       MAC

## 2020-04-06 NOTE — Progress Notes (Signed)
04/06/2020 [name] [id number] 1945/07/13   CHIEF COMPLAINT:  Rectal bleeding   HISTORY OF PRESENT ILLNESS: [name] is a 75 year old female with a past medical history of hypertension, hyperlipidemia, coronary artery disease s/p stent placement x 2 in 2007 and 2008 on Plavix, fibromuscular dysplasia in the intracranial vessels and left internal carotid artery dissection, CKD stage III, chronic anemia and neutropenia. Past cholecystectomy. She presents to our office today as referred by Dr. [name] for further evaluation regarding rectal bleeding which occurred on one day Oct or Nov. 2021. She reported seeing a small amount of bright red blood on the toilet tissue after straining. No associated anorectal pain. No further rectal bleeding since then. No melena. She typically passes a normal formed brown BM daily. She underwent a colonoscopy by Dr. Olevia Perches 05/29/2008 which was normal. Colonoscopy in 2005 was normal. She was previously advised by Dr. Havery Moros to repeat a colonoscopy 05/2018 which was not done. Her GERD symptoms are well controlled on Pantoprazole 77m QD. She underwent an EGD 01/26/2016 which identified a 4cm hiatal hernia, a benign appearing esophageal stenosis and a duodenal mucosal lymphangiectasia. No evidence of H. Pylori. History of CAD as noted above. She as last seen by her cardiologist Dr. NMeda Coffeeon 12/10/2019, cardiac status, BP and LE edema were well controlled at that time and she was advised to follow up in 6 months. No other complaints today.  Labs 1214/2021 from Dr. KJulianne Riceoffice: WBC 3.2. Hg 11.8. HCT 35.3. MCV 86.3. PLT 206. Na 146. Glu 97. BUN 21. Cr. 1.3. T. Bili 0.80. Alk phos 115. AST 24. ALT 28.   Labs 11/25/2018: Iron 62. B12 445.   EGD 01/26/2016 by Dr. AHavery Moros  - Esophagogastric landmarks identified. - 4 cm hiatal hernia. - Benign-appearing esophageal stenosis. - Normal stomach. Biopsied to rule out H pylori - Duodenal mucosal  lymphangiectasia. Biopsy report: 1. Surgical [P], small bowel nodule - BENIGN VILLOUS SMALL INTESTINAL MUCOSA WITH SMALL NODULE OF BRUNNER GLANDS AND LYMPHANGIECTASIA. - NO EVIDENCE OF PEPTIC DUODENITIS, ADENOMATOUS CHANGE OR MALIGNANCY. 2. Surgical [P], gastric antrum and gastric body - MILD CHRONIC GASTRITIS. -A WARTHIN-STARRY STAIN IS NEGATIVE FOR HELICOBACTER PYLORI. -NEGATIVE FOR INTESTINAL METAPLASIA OR MALIGNANCY.  Colonoscopy 05/29/2008 by Dr. BOlevia Perches Normal colonoscopy  Repeat colonoscopy 5 years   ECHO 09/15/2019: 1. Global longitudinal strain is -23.1%. Left ventricular ejection fraction, by estimation, is 65 to 70%. The left ventricle has normal function. The left ventricle has no regional wall motion abnormalities. Left ventricular diastolic parameters were normal. 2. Right ventricular systolic function is normal. The right ventricular size is normal. There is normal pulmonary artery systolic pressure. 3. There are 2 prominent jets of MR. Mild mitral valve regurgitation. 4. Compared to report from previous echo, aortic valve insufficiency is more prominent. No images to compare. The aortic valve is tricuspid. Aortic valve regurgitation is mild. Mild to moderate aortic valve sclerosis/calcification is present, without any evidence of aortic stenosis.   Past Medical History:  Diagnosis Date  . Carotid artery dissection (HCC)    a. remote LICA dissection.  . [place]   . Coronary artery disease    a. remote stents to LAD and large diagonal branch. b. PTCA of diag for ISR 2007. c. PTCA/DES to diagonal and PTCA of mid LAD 01/2007.  . Fibromuscular dysplasia ([place]   . GERD (gastroesophageal reflux disease)   . Headache(784.0)    hx of  .  Heart murmur   . Hyperlipidemia   . Hypertension   . Internal hemorrhoids without mention of complication   . Lower extremity edema   . Lower extremity edema   . Neutropenia, unspecified ([place])    . Obesity   . Premature atrial contractions   . PVC's (premature ventricular contractions)   . Sinus brady-tachy syndrome ([place])   . Stroke [facility])    Past Surgical History:  Procedure Laterality Date  . ABDOMINAL HYSTERECTOMY    . CARDIAC CATHETERIZATION  2008  . Cardiac Stents  2008  . COLONOSCOPY  2010   normal   . gallstones removed    . PARS PLANA VITRECTOMY  06/26/2011   Procedure: PARS PLANA VITRECTOMY WITH 23 GAUGE;  Surgeon: Adonis Brook, MD;  Location: [facility];  Service: Ophthalmology;  Laterality: Left;   Social History: She is widowed. She is retired. She has one daughter. Nonsmoker. No alcohol use. No drug use.  Family History: Mother with history of heart disease, colon cancer diagnosed at the age of 27, died at 1. Father had a MI. Sister with breast cancer.    Allergies  Allergen Reactions  . Fenoprofen Calcium Other (See Comments)    Stopped kidneys from functioning Stopped kidneys from functioning      Outpatient Encounter Medications as of 04/06/2020  Medication Sig  . atorvastatin (LIPITOR) 40 MG tablet TAKE 1 TABLET BY MOUTH EVERY DAY  . clopidogrel (PLAVIX) 75 MG tablet Take 1 tablet (75 mg total) by mouth daily.  . furosemide (LASIX) 20 MG tablet Take 1 tablet (20 mg total) by mouth daily.  . pantoprazole (PROTONIX) 40 MG tablet Take 40 mg by mouth daily.  . valsartan (DIOVAN) 80 MG tablet Take 1 tablet (80 mg total) by mouth daily.  . Vitamin D, Ergocalciferol, (DRISDOL) 1.25 MG (50000 UNIT) CAPS capsule Take 50,000 Units by mouth once a week.  . [DISCONTINUED] Acetaminophen-Codeine 300-30 MG tablet Take 1 tablet by mouth every 4 (four) hours as needed.  . [DISCONTINUED] ibuprofen (ADVIL) 600 MG tablet Take 600 mg by mouth every 4 (four) hours as needed.  . [DISCONTINUED] nitroGLYCERIN (NITROSTAT) 0.4 MG SL tablet Place 0.4 mg under the tongue every 5 (five) minutes as needed. For chest pain   Facility-Administered Encounter Medications as of 04/06/2020   Medication  . 0.9 %  sodium chloride infusion    REVIEW OF SYSTEMS:  Gen: Denies fever, sweats or chills. No weight loss.  CV: = LE edema. Denies chest pain or palpitations.  Resp: Denies cough, shortness of breath of hemoptysis.  GI: See HPI.  GU : + Increased urination. No dysuria.  MS: Denies joint pain, muscles aches or weakness. Derm: Denies rash, itchiness, skin lesions or unhealing ulcers. Psych: Denies depression, anxiety, memory loss, suicidal ideation and confusion. Heme: Denies bruising, bleeding. Neuro:  Denies headaches, dizziness or paresthesias. Endo:  Excessive thirst  PHYSICAL EXAM: BP 110/64   Pulse 66   Ht _0  (1.727 m)   Wt 219 lb (99.3 kg)   BMI 33.30 kg/m  General: 75 year old female in no acute distress. Head: Normocephalic and atraumatic. Eyes:  Sclerae non-icteric, conjunctive pink. Ears: Normal auditory acuity. Mouth: Upper dentures. No ulcers or lesions.  Neck: Supple, no lymphadenopathy or thyromegaly.  Lungs: Clear bilaterally to auscultation without wheezes, crackles or rhonchi. Heart: Regular rate and rhythm. Systolic murmur. No rub or gallop appreciated.  Abdomen: Soft, nontender, non distended. No masses. No hepatosplenomegaly. Normoactive bowel sounds x 4 quadrants.  Rectal: Deferred.  Musculoskeletal: Symmetrical with no gross deformities. Skin: Warm and dry. No rash or lesions on visible extremities. Extremities: No edema. Neurological: Alert oriented x 4, no focal deficits.  Psychological:  Alert and cooperative. Normal mood and affect.  ASSESSMENT AND PLAN:  75. 75 year old female with rectal bleeding x 1 episode 4 or 5 months ago without recurrence. Positive family history of colon cancer (mother). -Colonoscopy benefits and risks discussed including risk with sedation, risk of bleeding, perforation and infection  -Miralax Q HS as needed to avoid straining  -Patient to call our office if rectal bleeding recurs   2. GERD, stable  on Pantoprazole 66m QD  3. History of CAD s/p DES x 2 2007 and 2008 on Plavix  -Our office will contact cardiologist Dr. KEna Dawleyto verify Plavix instructions prior to colonoscopy. On Lasix for LE edema. LV EF 70% with mild MR per ECHO 09/15/2019.   4. Carotid artery disease, fibromuscular dysplasia of the intracranial vessels, past left internal carotid artery dissection.   5. Neutropenia followed by hematologist Dr. GLindi Adie Neutropenia thought to be due to medications,  no indication for bone marrow biopsy.   6. Chronic normocytic anemia, stable with baseline Hg 11s. -CBC, iron, iron saturation, TIBC and Ferritin level  7. CKD stage III.        CC:  KJani Gravel MD

## 2020-04-07 LAB — IRON,TIBC AND FERRITIN PANEL
%SAT: 24 % (calc) (ref 16–45)
Ferritin: 112 ng/mL (ref 16–288)
Iron: 62 ug/dL (ref 45–160)
TIBC: 261 mcg/dL (calc) (ref 250–450)

## 2020-04-07 NOTE — Telephone Encounter (Signed)
Pt w CAD, carotid artery dz. Only on single antiplatelet Rx with Plavix.  Last seen by Dr. Meda Coffee in 10/21.   Will fwd to Dr. Meda Coffee to get recommendations re: holding Plavix for colo. Richardson Dopp, PA-C    04/07/2020 1:01 PM

## 2020-04-08 NOTE — Progress Notes (Signed)
Agree with assessment and plan as outlined.  

## 2020-04-08 NOTE — Telephone Encounter (Signed)
   Primary Cardiologist: [name], MD  Chart reviewed as part of pre-operative protocol coverage. Given past medical history and time since last visit, based on ACC/AHA guidelines, [name] would be at acceptable risk for the planned procedure without further cardiovascular testing.   She has a hx of CAD and carotid artery disease treated with single antiplatelet therapy. She last saw Dr. Meda Coffee 11/2019. Per Dr. Meda Coffee, she may hold Plavix for 5 days prior to procedure and resume once okay per surgical team.   I will route this recommendation to the requesting party via Epic fax function and remove from pre-op pool.  Please call with questions.  Kathyrn Drown, NP 04/08/2020, 11:29 AM

## 2020-04-08 NOTE — Telephone Encounter (Signed)
Yes she can hold Plavix 5 days prior and post procedure.

## 2020-04-20 DIAGNOSIS — Z1231 Encounter for screening mammogram for malignant neoplasm of breast: Secondary | ICD-10-CM | POA: Diagnosis not present

## 2020-05-26 ENCOUNTER — Encounter: Payer: Medicare PPO | Admitting: Gastroenterology

## 2020-06-23 DIAGNOSIS — N1831 Chronic kidney disease, stage 3a: Secondary | ICD-10-CM | POA: Diagnosis not present

## 2020-06-23 DIAGNOSIS — I2510 Atherosclerotic heart disease of native coronary artery without angina pectoris: Secondary | ICD-10-CM | POA: Diagnosis not present

## 2020-06-23 DIAGNOSIS — K625 Hemorrhage of anus and rectum: Secondary | ICD-10-CM | POA: Diagnosis not present

## 2020-06-23 DIAGNOSIS — M7989 Other specified soft tissue disorders: Secondary | ICD-10-CM | POA: Diagnosis not present

## 2020-06-23 DIAGNOSIS — R5383 Other fatigue: Secondary | ICD-10-CM | POA: Diagnosis not present

## 2020-06-23 DIAGNOSIS — I773 Arterial fibromuscular dysplasia: Secondary | ICD-10-CM | POA: Diagnosis not present

## 2020-06-23 DIAGNOSIS — Z0000 Encounter for general adult medical examination without abnormal findings: Secondary | ICD-10-CM | POA: Diagnosis not present

## 2020-06-23 DIAGNOSIS — I129 Hypertensive chronic kidney disease with stage 1 through stage 4 chronic kidney disease, or unspecified chronic kidney disease: Secondary | ICD-10-CM | POA: Diagnosis not present

## 2020-06-23 DIAGNOSIS — E785 Hyperlipidemia, unspecified: Secondary | ICD-10-CM | POA: Diagnosis not present

## 2020-06-25 ENCOUNTER — Encounter: Payer: Medicare PPO | Admitting: Gastroenterology

## 2020-06-30 ENCOUNTER — Encounter: Payer: Self-pay | Admitting: Gastroenterology

## 2020-06-30 ENCOUNTER — Other Ambulatory Visit: Payer: Self-pay

## 2020-06-30 ENCOUNTER — Ambulatory Visit (AMBULATORY_SURGERY_CENTER): Payer: Medicare PPO | Admitting: Gastroenterology

## 2020-06-30 VITALS — BP 157/67 | HR 58 | Temp 98.0°F | Resp 14 | Ht 68.0 in | Wt 219.0 lb

## 2020-06-30 DIAGNOSIS — D120 Benign neoplasm of cecum: Secondary | ICD-10-CM

## 2020-06-30 DIAGNOSIS — K648 Other hemorrhoids: Secondary | ICD-10-CM | POA: Diagnosis not present

## 2020-06-30 DIAGNOSIS — Z1211 Encounter for screening for malignant neoplasm of colon: Secondary | ICD-10-CM | POA: Diagnosis not present

## 2020-06-30 DIAGNOSIS — K625 Hemorrhage of anus and rectum: Secondary | ICD-10-CM

## 2020-06-30 DIAGNOSIS — Z800 Family history of malignant neoplasm of digestive organs: Secondary | ICD-10-CM

## 2020-06-30 MED ORDER — SODIUM CHLORIDE 0.9 % IV SOLN: 500.0000 mL | Freq: Once | INTRAVENOUS | Status: DC

## 2020-06-30 NOTE — Op Note (Signed)
[place] Patient Name: [name] Procedure Date: 06/30/2020 2:48 PM MRN: [id number] Endoscopist: Remo Lipps P. Havery Moros , MD Age: 75 Referring MD:  Date of Birth: 1945-05-12 Gender: Female Account #: [id number] Procedure:                Colonoscopy Indications:              history of rectal bleeding, family history of colon                            cancer (mother dx age 59s), last colonoscopy 2010 Medicines:                Monitored Anesthesia Care Procedure:                Pre-Anesthesia Assessment:                           - Prior to the procedure, a History and Physical                            was performed, and patient medications and                            allergies were reviewed. The patient's tolerance of                            previous anesthesia was also reviewed. The risks                            and benefits of the procedure and the sedation                            options and risks were discussed with the patient.                            All questions were answered, and informed consent                            was obtained. Prior Anticoagulants: The patient has                            taken Plavix (clopidogrel), last dose was 5 days                            prior to procedure. ASA Grade Assessment: III - A                            patient with severe systemic disease. After                            reviewing the risks and benefits, the patient was                            deemed in satisfactory condition to undergo the  procedure.                           After obtaining informed consent, the colonoscope                            was passed under direct vision. Throughout the                            procedure, the patient's blood pressure, pulse, and                            oxygen saturations were monitored continuously. The                            Olympus [id number]-[id number])  Colonoscope was                            introduced through the anus and advanced to the the                            cecum, identified by appendiceal orifice and                            ileocecal valve. The colonoscopy was performed                            without difficulty. The patient tolerated the                            procedure well. The quality of the bowel                            preparation was good. The ileocecal valve,                            appendiceal orifice, and rectum were photographed. Scope In: 2:54:27 PM Scope Out: 3:14:52 PM Scope Withdrawal Time: 0 hours 16 minutes 49 seconds  Total Procedure Duration: 0 hours 20 minutes 25 seconds  Findings:                 The perianal and digital rectal examinations were                            normal.                           A diminutive polyp was found in the cecum. The                            polyp was sessile. The polyp was removed with a                            cold snare. Resection and retrieval were complete.  Internal hemorrhoids were found during                            retroflexion. The hemorrhoids were moderate.                           The exam was otherwise without abnormality. Complications:            No immediate complications. Estimated blood loss:                            Minimal. Estimated Blood Loss:     Estimated blood loss was minimal. Impression:               - One diminutive polyp in the cecum, removed with a                            cold snare. Resected and retrieved.                           - Internal hemorrhoids.                           - The examination was otherwise normal.                           Suspect patient likely had hemorrhoidal bleeding as                            cause of prior symptoms, which have since resolved. Recommendation:           - Patient has a contact number available for                             emergencies. The signs and symptoms of potential                            delayed complications were discussed with the                            patient. Return to normal activities tomorrow.                            Written discharge instructions were provided to the                            patient.                           - Resume previous diet.                           - Continue present medications.                           - Resume Plavix tomorrow                           -  Await pathology results.                           - No further colon cancer screening is likely                            warranted given no high risk findings on this exam                            and the patient's age [name]. Leslie Smither, MD 06/30/2020 3:19:19 PM This report has been signed electronically.

## 2020-06-30 NOTE — Progress Notes (Signed)
CW vitala sand SH IV.

## 2020-06-30 NOTE — Progress Notes (Signed)
Called to room to assist during endoscopic procedure.  Patient ID and intended procedure confirmed with present staff. Received instructions for my participation in the procedure from the performing physician.  

## 2020-06-30 NOTE — Patient Instructions (Signed)
Please read handouts provided. Continue present medications. Await pathology results. Resume Plavix tomorrow.     YOU HAD AN ENDOSCOPIC PROCEDURE TODAY AT [facility]:   Refer to the procedure report that was given to you for any specific questions about what was found during the examination.  If the procedure report does not answer your questions, please call your gastroenterologist to clarify.  If you requested that your care partner not be given the details of your procedure findings, then the procedure report has been included in a sealed envelope for you to review at your convenience later.  YOU SHOULD EXPECT: Some feelings of bloating in the abdomen. Passage of more gas than usual.  Walking can help get rid of the air that was put into your GI tract during the procedure and reduce the bloating. If you had a lower endoscopy (such as a colonoscopy or flexible sigmoidoscopy) you may notice spotting of blood in your stool or on the toilet paper. If you underwent a bowel prep for your procedure, you may not have a normal bowel movement for a few days.  Please Note:  You might notice some irritation and congestion in your nose or some drainage.  This is from the oxygen used during your procedure.  There is no need for concern and it should clear up in a day or so.  SYMPTOMS TO REPORT IMMEDIATELY:   Following lower endoscopy (colonoscopy or flexible sigmoidoscopy):  Excessive amounts of blood in the stool  Significant tenderness or worsening of abdominal pains  Swelling of the abdomen that is new, acute  Fever of 100F or higher   For urgent or emergent issues, a gastroenterologist can be reached at any hour by calling [phone]. Do not use MyChart messaging for urgent concerns.    DIET:  We do recommend a small meal at first, but then you may proceed to your regular diet.  Drink plenty of fluids but you should avoid alcoholic beverages for 24 hours.  ACTIVITY:  You  should plan to take it easy for the rest of today and you should NOT DRIVE or use heavy machinery until tomorrow (because of the sedation medicines used during the test).    FOLLOW UP: Our staff will call the number listed on your records 48-72 hours following your procedure to check on you and address any questions or concerns that you may have regarding the information given to you following your procedure. If we do not reach you, we will leave a message.  We will attempt to reach you two times.  During this call, we will ask if you have developed any symptoms of COVID 19. If you develop any symptoms (ie: fever, flu-like symptoms, shortness of breath, cough etc.) before then, please call [phone].  If you test positive for Covid 19 in the 2 weeks post procedure, please call and report this information to [place].    If any biopsies were taken you will be contacted by phone or by letter within the next 1-3 weeks.  Please call us at [phone] if you have not heard about the biopsies in 3 weeks.    SIGNATURES/CONFIDENTIALITY: You and/or your care partner have signed paperwork which will be entered into your electronic medical record.  These signatures attest to the fact that that the information above on your After Visit Summary has been reviewed and is understood.  Full responsibility of the confidentiality of this discharge information lies with you and/or your care-partner.

## 2020-06-30 NOTE — Progress Notes (Signed)
To PACU, VSS. Report to [email or web address] 

## 2020-07-02 ENCOUNTER — Telehealth: Payer: Self-pay

## 2020-07-02 NOTE — Telephone Encounter (Signed)
  Follow up Call-  Call back number 06/30/2020  Post procedure Call Back phone  # [phone]  Permission to leave phone message Yes  Some recent data might be hidden     Patient questions:  Do you have a fever, pain , or abdominal swelling? No. Pain Score  0 *  Have you tolerated food without any problems? Yes.    Have you been able to return to your normal activities? Yes.    Do you have any questions about your discharge instructions: Diet   No. Medications  No. Follow up visit  No.  Do you have questions or concerns about your Care? No.  Actions: * If pain score is 4 or above: No action needed, pain <4.  1. Have you developed a fever since your procedure? no  2.   Have you had an respiratory symptoms (SOB or cough) since your procedure? no  3.   Have you tested positive for COVID 19 since your procedure no  4.   Have you had any family members/close contacts diagnosed with the COVID 19 since your procedure?  no   If yes to any of these questions please route to Joylene John, RN and Joella Prince, RN

## 2020-07-06 ENCOUNTER — Telehealth: Payer: Self-pay | Admitting: Cardiology

## 2020-07-06 NOTE — Progress Notes (Deleted)
Cardiology Office Note:    Date:  07/06/2020   ID:  [name], [name] [birth year], MRN [id number]  PCP:  Leslie Gravel, MD   [facility] Providers Cardiologist:  [name], MD (Inactive) {   Referring MD: Leslie Gravel, MD    History of Present Illness:    [name] is a 75 y.o. 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 Meda Coffee who now returns to clinic for follow-up with concern for chest pain.  Per review of the record, the patient has history of myoview in 2017 for SOB which was negative for infarct or ischemia with normal LVEF. Also had Holter in 2017 palpitations that showed frequent PVCs, bigminy and sinus brady. Repeat holter in 2018 with significant improvement. During visit with Dr. Meda Coffee on 08/2019, she was experiencing worsening LE edema. TTE showed LVEF of 70%, global longitudinal strain -23%, aortic insufficiency mild to moderate and mild mitral regurgitation. Her lasix was increased at that visit to 40mg  daily with improvement.  She called clinic on 07/06/20 where she had an episode of chest pain while using the bathroom that radiated to her left arm. Symptoms lasted about 72min. She laid down, but symptoms recurred in the middle of the night prompting her to call clinic and schedule appointment today.  Today,  Past Medical History:  Diagnosis Date  . Carotid artery dissection (HCC)    a. remote LICA dissection.  . CKD (chronic kidney disease), stage III ([place])   . Coronary artery disease    a. remote stents to LAD and large diagonal branch. b. PTCA of diag for ISR 2007. c. PTCA/DES to diagonal and PTCA of mid LAD 01/2007.  . Fibromuscular dysplasia ([place])   . GERD (gastroesophageal reflux disease)   . Headache(784.0)    hx of  . Heart murmur   . Hyperlipidemia   .  Hypertension   . Internal hemorrhoids without mention of complication   . Lower extremity edema   . Lower extremity edema   . Neutropenia, unspecified ([place])   . Obesity   . Premature atrial contractions   . PVC's (premature ventricular contractions)   . Sinus brady-tachy syndrome ([place])   . Stroke [facility])     Past Surgical History:  Procedure Laterality Date  . ABDOMINAL HYSTERECTOMY    . CARDIAC CATHETERIZATION  2008  . Cardiac Stents  2008  . COLONOSCOPY  2010   normal   . gallstones removed    . PARS PLANA VITRECTOMY  06/26/2011   Procedure: PARS PLANA VITRECTOMY WITH 23 GAUGE;  Surgeon: Adonis Brook, MD;  Location: [facility];  Service: Ophthalmology;  Laterality: Left;    Current Medications: No outpatient medications have been marked as taking for the 07/07/20 encounter (Appointment) with [name], MD.     Allergies:   Fenoprofen calcium   Social History   Socioeconomic History  . Marital status: Married    Spouse name: [name]  . Number of children: 1  . Years of education: 59  . Highest education level: Not on file  Occupational History  . Occupation: Retired  Tobacco Use  . Smoking status: Never Smoker  . Smokeless tobacco: Never Used  Vaping Use  . Vaping Use: Never used  Substance and Sexual Activity  . Alcohol use: No  . Drug use: No  . Sexual  activity: Yes    Birth control/protection: Surgical  Other Topics Concern  . Not on file  Social History Narrative   Lives with husband   Caffeine use: Tea rare   Social Determinants of Health   Financial Resource Strain: Not on file  Food Insecurity: Not on file  Transportation Needs: Not on file  Physical Activity: Not on file  Stress: Not on file  Social Connections: Not on file     Family History: The patient's ***family history includes Breast cancer in her sister; Colon cancer in her mother; Heart attack in her father; Heart disease in her mother. There is no history of Anesthesia problems,  Hypotension, Malignant hyperthermia, Pseudochol deficiency, Esophageal cancer, Rectal cancer, or Stomach cancer.  ROS:   Please see the history of present illness.    *** All other systems reviewed and are negative.  EKGs/Labs/Other Studies Reviewed:    The following studies were reviewed today: TTE September 28, 2019: IMPRESSIONS    1. Global longitudinal strain is -23.1%. Left ventricular ejection  fraction, by estimation, is 65 to 70%. The left ventricle has normal  function. The left ventricle has no regional wall motion abnormalities.  Left ventricular diastolic parameters were  normal.  2. Right ventricular systolic function is normal. The right ventricular  size is normal. There is normal pulmonary artery systolic pressure.  3. There are 2 prominent jets of MR. Mild mitral valve regurgitation.  4. Compared to report from previous echo, aortic valve insufficiency is  more prominent. No images to compare. The aortic valve is tricuspid.  Aortic valve regurgitation is mild. Mild to moderate aortic valve  sclerosis/calcification is present, without  any evidence of aortic stenosis.   Comparison(s): 08/13/15 EF 60-65%.  Holter 2018:   Sinus bradycardia to sinus rhythm.  No AV blocks or pauses > 2 seconds.  Frequent PACs, infrequent PVCs.   No AV blocks or pauses > 2 seconds. No diary provided.  No reason for cardiac source of dizziness was identified.   Myoview 2017:  Nuclear stress EF: 71%.  There was 5mm of horizontal ST segment depression in the lateral precordial leads that became downsloping in recovery. Patient had submaximal exercise treadmill test and was change to a Lexiscan.  The nuclear images are normal.  This is a low risk study.  The left ventricular ejection fraction is hyperdynamic (>65%).  Holter 2017:  Sinus bradycardia to sinus rhythm.  Frequent PVCs and bigeminy, few triplets.  Infrequent PACs.    EKG:  EKG is *** ordered today.  The ekg  ordered today demonstrates ***  Recent Labs: 04/06/2020: Hemoglobin 11.7; Platelets 178.0  Recent Lipid Panel    Component Value Date/Time   CHOL 129 10/31/2018 0906   TRIG 59 10/31/2018 0906   HDL 55 10/31/2018 0906   CHOLHDL 2.3 10/31/2018 0906   CHOLHDL 2.3 07/07/2015 0913   VLDL 13 07/07/2015 0913   LDLCALC 61 10/31/2018 0906     Risk Assessment/Calculations:   {Does this patient have ATRIAL FIBRILLATION?:[id number]}   Physical Exam:    VS:  There were no vitals taken for this visit.    Wt Readings from Last 3 Encounters:  06/30/20 219 lb (99.3 kg)  04/06/20 219 lb (99.3 kg)  12/10/19 217 lb (98.4 kg)     GEN: *** Well nourished, well developed in no acute distress HEENT: Normal NECK: No JVD; No carotid bruits LYMPHATICS: No lymphadenopathy CARDIAC: ***RRR, no murmurs, rubs, gallops RESPIRATORY:  Clear to auscultation without rales, wheezing or rhonchi  ABDOMEN: Soft, non-tender, non-distended MUSCULOSKELETAL:  No edema; No deformity  SKIN: Warm and dry NEUROLOGIC:  Alert and oriented x 3 PSYCHIATRIC:  Normal affect   ASSESSMENT:    No diagnosis found. PLAN:    In order of problems listed above:  #Chest Pain: #Known CAD s/p PCI to LAD and D2 2008: -Repeat myoview to assess for ischemia -Recent TTE in 2021 with normal LVEF, normal strain -Continue lipitor 40mg  daily -Continue plavix 75mg  daily  #FMD with history of carotid dissection: -Repeat carotid ultrasound given arm pain -Continue atorvastatin 40mg  daily and plavix 75mg  daily as above  #HTN: Well controlled. 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 stress test, cath, DCCV, TEE, etc)?   Press F2        :[id number]}    Medication Adjustments/Labs and Tests Ordered: Current medicines are reviewed at length  with the patient today.  Concerns regarding medicines are outlined above.  No orders of the defined types were placed in this encounter.  No orders of the defined types were placed in this encounter.   There are no Patient Instructions on file for this visit.   Signed, [name], MD  07/06/2020 1:40 PM    [place]

## 2020-07-06 NOTE — Telephone Encounter (Signed)
[name] reports that last Thursday while using the bathroom she experienced CP under her L breast/side that radiated down her arm. Her arm then felt "swimmy" and tingly.  The episode lasted about 30 minutes. When it subsided, she was able to get up from the toilet and go lie down.  She was asymptomatic until the same symptoms woke her up last night. The chest pain and arm tingling had returned but the episode was not as long and not as severe. She denies any other symptoms during the episodes. She states that these symptoms do not resemble the symptoms exhibited when she had her stents placed.  She has no VS to report. She is taking her medications as directed, although she did hold Plavix last week for a colonoscopy - she restarted the med last Thursday (the same day she had her episode). She does not have a prescription for NTG. She is asymptomatic right now.  The scheduler arranged an appointment with Dr. Johney Frame tomorrow. Explained to the patient Dr. Meda Coffee is no longer practicing here and Dr. Johney Frame will be her new MD. ER precautions reviewed. She was grateful for call and agrees with plan.

## 2020-07-06 NOTE — Telephone Encounter (Signed)
Pt c/o of Chest Pain: STAT if CP now or developed within 24 hours  1. Are you having CP right now? Yes, heaviness in chest  2. Are you experiencing any other symptoms (ex. SOB, nausea, vomiting, sweating)? Sweating & numbness going down arm. Still having tingling in arm now   3. How long have you been experiencing CP? Started occurring last week   4. Is your CP continuous or coming and going? Coming and going, last night was the second occurrence   5. Have you taken Nitroglycerin? No    Pt is scheduled for an appt tomorrow morning in regards to this.  ?

## 2020-07-07 ENCOUNTER — Encounter: Payer: Self-pay | Admitting: *Deleted

## 2020-07-07 ENCOUNTER — Encounter: Payer: Self-pay | Admitting: Cardiology

## 2020-07-07 ENCOUNTER — Other Ambulatory Visit: Payer: Self-pay

## 2020-07-07 ENCOUNTER — Ambulatory Visit: Payer: Medicare PPO | Admitting: Cardiology

## 2020-07-07 VITALS — BP 118/72 | HR 54 | Ht 68.0 in | Wt 222.0 lb

## 2020-07-07 DIAGNOSIS — R079 Chest pain, unspecified: Secondary | ICD-10-CM | POA: Diagnosis not present

## 2020-07-07 DIAGNOSIS — I7771 Dissection of carotid artery: Secondary | ICD-10-CM | POA: Diagnosis not present

## 2020-07-07 DIAGNOSIS — I2510 Atherosclerotic heart disease of native coronary artery without angina pectoris: Secondary | ICD-10-CM | POA: Diagnosis not present

## 2020-07-07 DIAGNOSIS — R600 Localized edema: Secondary | ICD-10-CM

## 2020-07-07 DIAGNOSIS — N183 Chronic kidney disease, stage 3 unspecified: Secondary | ICD-10-CM

## 2020-07-07 DIAGNOSIS — R011 Cardiac murmur, unspecified: Secondary | ICD-10-CM | POA: Diagnosis not present

## 2020-07-07 DIAGNOSIS — R072 Precordial pain: Secondary | ICD-10-CM

## 2020-07-07 DIAGNOSIS — E782 Mixed hyperlipidemia: Secondary | ICD-10-CM

## 2020-07-07 DIAGNOSIS — R0989 Other specified symptoms and signs involving the circulatory and respiratory systems: Secondary | ICD-10-CM

## 2020-07-07 NOTE — Progress Notes (Signed)
Cardiology Office Note:    Date:  07/07/2020   ID:  Leslie, Hart 1945/09/28, MRN [id number]  PCP:  Leslie Gravel, MD   [facility] Providers Cardiologist:  Leslie Dawley, MD (Inactive) {   Referring MD: Leslie Gravel, MD    History of Present Illness:    [name] is a 75 y.o. 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 Meda Hart who now returns to clinic for follow-up with concern for chest pain.  Per review of the record, the patient has history of myoview in 2017 for SOB which was negative for infarct or ischemia with normal LVEF. Also had Holter in 2017 palpitations that showed frequent PVCs, bigminy and sinus brady. Repeat holter in 2018 with significant improvement. During visit with Dr. Meda Hart on 08/2019, she was experiencing worsening LE edema. TTE showed LVEF of 70%, global longitudinal strain -23%, aortic insufficiency mild to moderate and mild mitral regurgitation. Her lasix was increased at that visit to 40mg  daily with improvement.  She called clinic on 07/06/20 where she had an episode of chest pain while using the bathroom that radiated to her left arm. Symptoms lasted about 10min. She laid down, but symptoms recurred in the middle of the night prompting her to call clinic and schedule appointment today.  Today, she is doing better. Her chest pain has improved since her call yesterday.  As detailed above, she initially developed severe chest pain/pressure while urinating at night. Pain was in the left side of her chest and radiated down her left arm. Had some mild diaphoresis but no significant SOB. Pain was not pleuritic or positional. Resolved on it's own after about 5 minutes. She had another episode in the past that woke her up from her sleep. This episode was less severe in terms of  chest pressure and again resolved on it's own. Pain was different on both occasions than the angina she experienced with her stent in the past. No neck pain or changes in vision. Has since been able to walk stairs multiple times without exertional chest pain, SOB, palpitations, or dizziness. Has chronic LE edema, but no  PND, orthopnea, pre-syncopal, or syncopal episodes. Of note, she recently was off her plavix for 5 days for a colonscopy but has since resumed her medication.    Past Medical History:  Diagnosis Date  . Carotid artery dissection (HCC)    a. remote LICA dissection.  . CKD (chronic kidney disease), stage III ([place])   . Coronary artery disease    a. remote stents to LAD and large diagonal branch. b. PTCA of diag for ISR 2007. c. PTCA/DES to diagonal and PTCA of mid LAD 01/2007.  . Fibromuscular dysplasia ([place])   . GERD (gastroesophageal reflux disease)   . Headache(784.0)    hx of  . Heart murmur   . Hyperlipidemia   . Hypertension   . Internal hemorrhoids without mention of complication   . Lower extremity edema   . Lower extremity edema   . Neutropenia, unspecified ([place])   . Obesity   . Premature atrial contractions   . PVC's (premature ventricular contractions)   . Sinus brady-tachy syndrome ([place])   . Stroke [facility])     Past Surgical History:  Procedure Laterality Date  . ABDOMINAL HYSTERECTOMY    . CARDIAC CATHETERIZATION  2008  .  Cardiac Stents  2008  . COLONOSCOPY  2010   normal   . gallstones removed    . PARS PLANA VITRECTOMY  06/26/2011   Procedure: PARS PLANA VITRECTOMY WITH 23 GAUGE;  Surgeon: Adonis Brook, MD;  Location: [place];  Service: Ophthalmology;  Laterality: Left;    Current Medications: Current Meds  Medication Sig  . atorvastatin (LIPITOR) 40 MG tablet TAKE 1 TABLET BY MOUTH EVERY DAY  . clopidogrel (PLAVIX) 75 MG tablet Take 1 tablet (75 mg total) by mouth daily.  . furosemide (LASIX) 20 MG tablet Take 1 tablet (20 mg total) by mouth daily.   . pantoprazole (PROTONIX) 40 MG tablet Take 40 mg by mouth daily.  . valsartan (DIOVAN) 80 MG tablet Take 1 tablet (80 mg total) by mouth daily.  . Vitamin D, Ergocalciferol, (DRISDOL) 1.25 MG (50000 UNIT) CAPS capsule Take 50,000 Units by mouth once a week.     Allergies:   Fenoprofen calcium   Social History   Socioeconomic History  . Marital status: Married    Spouse name: [name]  . Number of children: 1  . Years of education: 46  . Highest education level: Not on file  Occupational History  . Occupation: Retired  Tobacco Use  . Smoking status: Never Smoker  . Smokeless tobacco: Never Used  Vaping Use  . Vaping Use: Never used  Substance and Sexual Activity  . Alcohol use: No  . Drug use: No  . Sexual activity: Yes    Birth control/protection: Surgical  Other Topics Concern  . Not on file  Social History Narrative   Lives with husband   Caffeine use: Tea rare   Social Determinants of Health   Financial Resource Strain: Not on file  Food Insecurity: Not on file  Transportation Needs: Not on file  Physical Activity: Not on file  Stress: Not on file  Social Connections: Not on file     Family History: The patient's family history includes Breast cancer in her sister; Colon cancer in her mother; Heart attack in her father; Heart disease in her mother. There is no history of Anesthesia problems, Hypotension, Malignant hyperthermia, Pseudochol deficiency, Esophageal cancer, Rectal cancer, or Stomach cancer.  ROS:   Review of Systems  Constitutional: Negative for chills, diaphoresis and fever.  HENT: Negative for congestion and sore throat.   Respiratory: Negative for shortness of breath and wheezing.   Cardiovascular: Positive for chest pain (pressure), palpitations (occasional) and leg swelling. Negative for orthopnea.  Gastrointestinal: Negative for abdominal pain, blood in stool, diarrhea, heartburn, nausea and vomiting.  Genitourinary: Negative for dysuria,  hematuria and urgency.  Neurological: Positive for dizziness. Negative for headaches.     EKGs/Labs/Other Studies Reviewed:    The following studies were reviewed today: TTE 09-18-2019: IMPRESSIONS    1. Global longitudinal strain is -23.1%. Left ventricular ejection  fraction, by estimation, is 65 to 70%. The left ventricle has normal  function. The left ventricle has no regional wall motion abnormalities.  Left ventricular diastolic parameters were  normal.  2. Right ventricular systolic function is normal. The right ventricular  size is normal. There is normal pulmonary artery systolic pressure.  3. There are 2 prominent jets of MR. Mild mitral valve regurgitation.  4. Compared to report from previous echo, aortic valve insufficiency is  more prominent. No images to compare. The aortic valve is tricuspid.  Aortic valve regurgitation is mild. Mild to moderate aortic valve  sclerosis/calcification is present, without  any evidence of  aortic stenosis.   Comparison(s): 08/13/15 EF 60-65%.  Holter 2018:   Sinus bradycardia to sinus rhythm.  No AV blocks or pauses > 2 seconds.  Frequent PACs, infrequent PVCs.   No AV blocks or pauses > 2 seconds. No diary provided.  No reason for cardiac source of dizziness was identified.   Myoview 2017:  Nuclear stress EF: 71%.  There was 29mm of horizontal ST segment depression in the lateral precordial leads that became downsloping in recovery. Patient had submaximal exercise treadmill test and was change to a Lexiscan.  The nuclear images are normal.  This is a low risk study.  The left ventricular ejection fraction is hyperdynamic (>65%).  Holter 2017:  Sinus bradycardia to sinus rhythm.  Frequent PVCs and bigeminy, few triplets.  Infrequent PACs.    EKG:   07/07/20-Sinus Bradycardia, rate: 85 bpm    Recent Labs: 04/06/2020: Hemoglobin 11.7; Platelets 178.0  Recent Lipid Panel    Component Value Date/Time   CHOL  129 10/31/2018 0906   TRIG 59 10/31/2018 0906   HDL 55 10/31/2018 0906   CHOLHDL 2.3 10/31/2018 0906   CHOLHDL 2.3 07/07/2015 0913   VLDL 13 07/07/2015 0913   LDLCALC 61 10/31/2018 0906     Physical Exam:    VS:  BP 118/72   Pulse (!) 54   Ht 5\' 8"  (1.727 m)   Wt 222 lb (100.7 kg)   SpO2 99%   BMI 33.75 kg/m     Wt Readings from Last 3 Encounters:  07/07/20 222 lb (100.7 kg)  06/30/20 219 lb (99.3 kg)  04/06/20 219 lb (99.3 kg)     GEN: Well nourished, well developed in no acute distress HEENT: Normal NECK: No JVD; No carotid bruits LYMPHATICS: No lymphadenopathy CARDIAC: RRR, 2/6 systolic murmurs, no rubs or  gallops RESPIRATORY:  Clear to auscultation without rales, wheezing or rhonchi  ABDOMEN: Soft, non-tender, non-distended MUSCULOSKELETAL:  +1 pitting edema; No deformity  SKIN: Warm and dry NEUROLOGIC:  Alert and oriented x 3 PSYCHIATRIC:  Normal affect   ASSESSMENT:    1. Coronary artery disease involving native coronary artery of native heart without angina pectoris   2. Mixed hyperlipidemia   3. Stage 3 chronic kidney disease, unspecified whether stage 3a or 3b CKD (HCC)   4. Carotid artery dissection (HCC)   5. Chest pain of uncertain etiology   6. Bilateral carotid bruits   7. Bilateral lower extremity edema   8. Precordial pain   9. Systolic murmur    PLAN:    In order of problems listed above:  #Chest Pain: #Known CAD s/p PCI to LAD and D2 in 2008: Patient with 2 episodes of substernal chest pressure radiating to her left arm while at rest. One occurred after using the bathroom in the middle of the night and the second episode occurred while sleeping. Both resolved on their own. Has no symptoms with exertion and no recurrence of symptoms for 2 days. Pain is atypical in nature, however, given her history of known CAD will proceed with ischemic work-up at this time. Per patient preference, will start with myoview, however, if she has another episode  of chest discomfort or symptoms that develop with exertion, will plan on cath -Check myoview to assess for ischemia -Check carotid dopplers given history of FMD and carotid dissection in the past -Recent TTE in 2021 with normal LVEF, normal strain -Continue lipitor 40mg  daily -Continue plavix 75mg  daily  #FMD with history of carotid dissection: -Repeat  carotid ultrasound given arm pain -Continue atorvastatin 40mg  daily and plavix 75mg  daily as above  #LE Edema: Chronic but slightly worse today per patient report. TTE 2021 with normal LVEF.  -Check BNP today -Continue lasix 20mg  daily -Compression socks and leg elevation  #HTN: Well controlled. -Continue valsartan 80mg  daily -Check BMET today  #HLD: -Continue lipitor 40mg  daily as above  #Mild-to-moderate AI: -Will need serial monitoring every 2-3 year with next TTE 2024  #Mild MR: -Continue serial monitoring with next TTE in 2024 unless clinical change   Shared Decision Making/Informed Consent The risks [chest pain, shortness of breath, cardiac arrhythmias, dizziness, blood pressure fluctuations, myocardial infarction, stroke/transient ischemic attack, nausea, vomiting, allergic reaction, radiation exposure, metallic taste sensation and life-threatening complications (estimated to be 1 in 10,000)], benefits (risk stratification, diagnosing coronary artery disease, treatment guidance) and alternatives of a nuclear stress test were discussed in detail with Ms. Neaves and she agrees to proceed.     Medication Adjustments/Labs and Tests Ordered: Current medicines are reviewed at length with the patient today.  Concerns regarding medicines are outlined above.  Orders Placed This Encounter  Procedures  . Basic metabolic panel  . Pro b natriuretic peptide  . MYOCARDIAL PERFUSION IMAGING  . EKG 12-Lead  . VAS US CAROTID   No orders of the defined types were placed in this encounter.   Patient Instructions  Medication  Instructions:   Your physician recommends that you continue on your current medications as directed. Please refer to the Current Medication list given to you today.  *If you need a refill on your cardiac medications before your next appointment, please call your pharmacy*   Lab Work:  TODAY--BMET AND PRO-BNP  If you have labs (blood work) drawn today and your tests are completely normal, you will receive your results only by: Marland Kitchen MyChart Message (if you have MyChart) OR . A paper copy in the mail If you have any lab test that is abnormal or we need to change your treatment, we will call you to review the results.   Testing/Procedures:  Your physician has requested that you have a lexiscan myoview. For further information please visit [email or web address]. Please follow instruction sheet, as given.  Your physician has requested that you have a carotid duplex. This test is an ultrasound of the carotid arteries in your neck. It looks at blood flow through these arteries that supply the brain with blood. Allow one hour for this exam. There are no restrictions or special instructions.   Follow-Up:  3 MONTHS WITH AN EXTENDER IN THE OFFICE       I,Alexis Bryant,acting as a scribe for [name], MD.,have documented all relevant documentation on the behalf of [name], MD,as directed by  [name], MD while in the presence of [name], MD.  I, [name], MD, have reviewed all documentation for this visit. The documentation on 07/07/20 for the exam, diagnosis, procedures, and orders are all accurate and complete.  Signed, [name], MD  07/07/2020 12:19 PM    [place]

## 2020-07-07 NOTE — Patient Instructions (Signed)
Medication Instructions:   Your physician recommends that you continue on your current medications as directed. Please refer to the Current Medication list given to you today.  *If you need a refill on your cardiac medications before your next appointment, please call your pharmacy*   Lab Work:  TODAY--BMET AND PRO-BNP  If you have labs (blood work) drawn today and your tests are completely normal, you will receive your results only by: Marland Kitchen MyChart Message (if you have MyChart) OR . A paper copy in the mail If you have any lab test that is abnormal or we need to change your treatment, we will call you to review the results.   Testing/Procedures:  Your physician has requested that you have a lexiscan myoview. For further information please visit [email or web address]. Please follow instruction sheet, as given.  Your physician has requested that you have a carotid duplex. This test is an ultrasound of the carotid arteries in your neck. It looks at blood flow through these arteries that supply the brain with blood. Allow one hour for this exam. There are no restrictions or special instructions.   Follow-Up:  3 MONTHS WITH AN EXTENDER IN THE OFFICE

## 2020-07-08 LAB — BASIC METABOLIC PANEL
BUN/Creatinine Ratio: 13 (ref 12–28)
BUN: 14 mg/dL (ref 8–27)
CO2: 24 mmol/L (ref 20–29)
Calcium: 9.8 mg/dL (ref 8.7–10.3)
Chloride: 105 mmol/L (ref 96–106)
Creatinine, Ser: 1.09 mg/dL — ABNORMAL HIGH (ref 0.57–1.00)
Glucose: 123 mg/dL — ABNORMAL HIGH (ref 65–99)
Potassium: 3.8 mmol/L (ref 3.5–5.2)
Sodium: 142 mmol/L (ref 134–144)
eGFR: 53 mL/min/{1.73_m2} — ABNORMAL LOW (ref >59)

## 2020-07-08 LAB — PRO B NATRIURETIC PEPTIDE: NT-Pro BNP: 196 pg/mL (ref 0–738)

## 2020-07-20 ENCOUNTER — Ambulatory Visit (HOSPITAL_COMMUNITY)
Admission: RE | Admit: 2020-07-20 | Payer: Medicare PPO | Source: Ambulatory Visit | Attending: Cardiology | Admitting: Cardiology

## 2020-07-21 ENCOUNTER — Encounter: Payer: Medicare PPO | Admitting: Gastroenterology

## 2020-07-22 ENCOUNTER — Telehealth (HOSPITAL_COMMUNITY): Payer: Self-pay | Admitting: *Deleted

## 2020-07-22 NOTE — Telephone Encounter (Signed)
Left message on voicemail per DPR in reference to upcoming appointment scheduled on 07/29/20 with detailed instructions given per Myocardial Perfusion Study Information Sheet for the test. LM to arrive 15 minutes early, and that it is imperative to arrive on time for appointment to keep from having the test rescheduled. If you need to cancel or reschedule your appointment, please call the office within 24 hours of your appointment. Failure to do so may result in a cancellation of your appointment, and a $50 no show fee. Phone number given for call back for any questions. Kirstie Peri

## 2020-07-28 ENCOUNTER — Other Ambulatory Visit: Payer: Medicare PPO

## 2020-07-28 DIAGNOSIS — Z20822 Contact with and (suspected) exposure to covid-19: Secondary | ICD-10-CM | POA: Diagnosis not present

## 2020-07-29 ENCOUNTER — Other Ambulatory Visit: Payer: Self-pay

## 2020-07-29 ENCOUNTER — Ambulatory Visit (HOSPITAL_COMMUNITY)
Admission: RE | Admit: 2020-07-29 | Discharge: 2020-07-29 | Disposition: A | Discharge status: Home / Self Care | Payer: Medicare PPO | Source: Ambulatory Visit | Attending: Cardiovascular Disease | Admitting: Cardiovascular Disease

## 2020-07-29 ENCOUNTER — Ambulatory Visit (HOSPITAL_BASED_OUTPATIENT_CLINIC_OR_DEPARTMENT_OTHER): Payer: Medicare PPO

## 2020-07-29 DIAGNOSIS — E782 Mixed hyperlipidemia: Secondary | ICD-10-CM | POA: Insufficient documentation

## 2020-07-29 DIAGNOSIS — I7771 Dissection of carotid artery: Secondary | ICD-10-CM | POA: Insufficient documentation

## 2020-07-29 DIAGNOSIS — I2510 Atherosclerotic heart disease of native coronary artery without angina pectoris: Secondary | ICD-10-CM | POA: Diagnosis not present

## 2020-07-29 DIAGNOSIS — R079 Chest pain, unspecified: Secondary | ICD-10-CM | POA: Insufficient documentation

## 2020-07-29 DIAGNOSIS — R0989 Other specified symptoms and signs involving the circulatory and respiratory systems: Secondary | ICD-10-CM | POA: Diagnosis not present

## 2020-07-29 DIAGNOSIS — R072 Precordial pain: Secondary | ICD-10-CM | POA: Diagnosis not present

## 2020-07-29 LAB — MYOCARDIAL PERFUSION IMAGING
LV dias vol: 94 mL (ref 46–106)
LV sys vol: 27 mL
Peak HR: 62 {beats}/min
Rest HR: 44 {beats}/min
SDS: 0
SRS: 0
SSS: 0
TID: 1.05

## 2020-07-29 MED ORDER — TECHNETIUM TC 99M TETROFOSMIN IV KIT
32.3000 | PACK | Freq: Once | INTRAVENOUS | Status: AC | PRN
Start: 2020-07-29 — End: 2020-07-29
  Administered 2020-07-29: 32.3 via INTRAVENOUS
  Filled 2020-07-29: qty 33

## 2020-07-29 MED ORDER — REGADENOSON 0.4 MG/5ML IV SOLN
0.4000 mg | Freq: Once | INTRAVENOUS | Status: AC
  Administered 2020-07-29: 0.4 mg via INTRAVENOUS

## 2020-07-29 MED ORDER — TECHNETIUM TC 99M TETROFOSMIN IV KIT
10.8000 | PACK | Freq: Once | INTRAVENOUS | Status: AC | PRN
  Administered 2020-07-29: 10.8 via INTRAVENOUS
  Filled 2020-07-29: qty 11

## 2020-08-05 DIAGNOSIS — E7801 Familial hypercholesterolemia: Secondary | ICD-10-CM | POA: Diagnosis not present

## 2020-08-05 DIAGNOSIS — I10 Essential (primary) hypertension: Secondary | ICD-10-CM | POA: Diagnosis not present

## 2020-08-05 DIAGNOSIS — R5383 Other fatigue: Secondary | ICD-10-CM | POA: Diagnosis not present

## 2020-08-10 DIAGNOSIS — M25512 Pain in left shoulder: Secondary | ICD-10-CM | POA: Diagnosis not present

## 2020-08-10 DIAGNOSIS — Z0000 Encounter for general adult medical examination without abnormal findings: Secondary | ICD-10-CM | POA: Diagnosis not present

## 2020-08-10 DIAGNOSIS — E7800 Pure hypercholesterolemia, unspecified: Secondary | ICD-10-CM | POA: Diagnosis not present

## 2020-08-10 DIAGNOSIS — D72819 Decreased white blood cell count, unspecified: Secondary | ICD-10-CM | POA: Diagnosis not present

## 2020-08-10 DIAGNOSIS — I2510 Atherosclerotic heart disease of native coronary artery without angina pectoris: Secondary | ICD-10-CM | POA: Diagnosis not present

## 2020-08-10 DIAGNOSIS — E559 Vitamin D deficiency, unspecified: Secondary | ICD-10-CM | POA: Diagnosis not present

## 2020-08-10 DIAGNOSIS — I10 Essential (primary) hypertension: Secondary | ICD-10-CM | POA: Diagnosis not present

## 2020-08-30 ENCOUNTER — Other Ambulatory Visit: Payer: Self-pay

## 2020-08-30 MED ORDER — VALSARTAN 80 MG PO TABS: 80.0000 mg | ORAL_TABLET | Freq: Every day | ORAL | 3 refills | Status: DC

## 2020-09-13 ENCOUNTER — Encounter: Payer: Self-pay | Admitting: Cardiology

## 2020-09-13 ENCOUNTER — Ambulatory Visit (HOSPITAL_COMMUNITY): Payer: Medicare PPO | Attending: Internal Medicine

## 2020-09-13 ENCOUNTER — Other Ambulatory Visit: Payer: Self-pay

## 2020-09-13 DIAGNOSIS — R011 Cardiac murmur, unspecified: Secondary | ICD-10-CM | POA: Diagnosis not present

## 2020-09-13 DIAGNOSIS — N183 Chronic kidney disease, stage 3 unspecified: Secondary | ICD-10-CM

## 2020-09-13 DIAGNOSIS — R001 Bradycardia, unspecified: Secondary | ICD-10-CM | POA: Diagnosis not present

## 2020-09-13 DIAGNOSIS — I071 Rheumatic tricuspid insufficiency: Secondary | ICD-10-CM | POA: Insufficient documentation

## 2020-09-13 DIAGNOSIS — R600 Localized edema: Secondary | ICD-10-CM | POA: Insufficient documentation

## 2020-09-13 DIAGNOSIS — I340 Nonrheumatic mitral (valve) insufficiency: Secondary | ICD-10-CM | POA: Insufficient documentation

## 2020-09-13 DIAGNOSIS — Z79899 Other long term (current) drug therapy: Secondary | ICD-10-CM | POA: Insufficient documentation

## 2020-09-13 DIAGNOSIS — I351 Nonrheumatic aortic (valve) insufficiency: Secondary | ICD-10-CM | POA: Insufficient documentation

## 2020-09-13 LAB — ECHOCARDIOGRAM COMPLETE
AR max vel: 1.96 cm2
AV Area VTI: 1.84 cm2
AV Area mean vel: 1.66 cm2
AV Mean grad: 8.5 mmHg
AV Peak grad: 15.3 mmHg
Ao pk vel: 1.96 m/s
Area-P 1/2: 3.02 cm2
P 1/2 time: 715 msec
S' Lateral: 2.9 cm

## 2020-09-14 ENCOUNTER — Telehealth: Payer: Self-pay | Admitting: *Deleted

## 2020-09-14 DIAGNOSIS — I351 Nonrheumatic aortic (valve) insufficiency: Secondary | ICD-10-CM

## 2020-09-14 DIAGNOSIS — I340 Nonrheumatic mitral (valve) insufficiency: Secondary | ICD-10-CM

## 2020-09-14 NOTE — Telephone Encounter (Signed)
Leslie Margarita, MD  09/13/2020  5:11 PM EDT      Echo showed normal heart function with mildly thickened heart muscle and icnreased stiffness of heart muscle.  There was mild to moderately leaky MV andTV and AV - repeat echo in 1 year for AI/MR/TR   Leslie Crome, MD  09/13/2020  5:05 PM EDT      Let the patient know there is no worrisome change c/w 1 year ago. Heart stillstrong and AR mild to moderate. LV is slightly smaller which is an improvement.    The patient has been notified of the result and verbalized understanding.  All questions (if any) were answered.  Repeat echo for one year placed in the system.  Pt is aware that I will send a message to our echo scheduler to call her back and arrange this for one year out. Pt verbalized understanding and agrees with this plan.

## 2020-09-14 NOTE — Telephone Encounter (Signed)
-----   Message from Loren Racer, RN sent at 09/13/2020  5:31 PM EDT ----- Left message to call back

## 2020-10-05 DIAGNOSIS — H903 Sensorineural hearing loss, bilateral: Secondary | ICD-10-CM | POA: Diagnosis not present

## 2020-10-05 DIAGNOSIS — Z822 Family history of deafness and hearing loss: Secondary | ICD-10-CM | POA: Diagnosis not present

## 2020-10-05 NOTE — Progress Notes (Deleted)
Cardiology Office Note    Date:  10/05/2020   ID:  [name] October 10, 1945, MRN [id number]   PCP:  Leslie Gravel, MD   [place]  Cardiologist:  Leslie Bergeron, MD   Advanced Practice Provider:  No care team member to display Electrophysiologist:  None   [phone]   No chief complaint on file.   History of Present Illness:  [name] is a 75 y.o. 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 Johney Frame 07/07/2020 with 2 episodes of substernal chest pain felt to be somewhat atypical but with history of CAD she ordered a Myoview performed 07/29/2020 and was normal, low risk study EF 71%.  Carotid Dopplers at same day were normal  Past Medical History:  Diagnosis Date   Aortic insufficiency    mild to moderate by echo 08/2020   Carotid artery dissection (HCC)    a. remote LICA dissection.   CKD (chronic kidney disease), stage III (HCC)    Coronary artery disease    a. remote stents to LAD and large diagonal branch. b. PTCA of diag for ISR 2007. c. PTCA/DES to diagonal and PTCA of mid LAD 01/2007.   Fibromuscular dysplasia (HCC)    GERD (gastroesophageal reflux disease)    Headache(784.0)    hx of   Heart murmur    Hyperlipidemia    Hypertension    Internal hemorrhoids without mention of complication    Lower extremity edema    Lower extremity edema    Mitral regurgitation    mild to moderate by echo 08/2020   Neutropenia, unspecified (HCC)    Obesity    Premature atrial contractions    PVC's (premature ventricular contractions)    Sinus brady-tachy syndrome (HCC)    Stroke (HCC)    Tricuspid regurgitation    mild to moderate by echo 08/2020    Past Surgical History:  Procedure Laterality Date   ABDOMINAL HYSTERECTOMY     CARDIAC CATHETERIZATION  2008   Cardiac  Stents  2008   COLONOSCOPY  2010   normal    gallstones removed     PARS PLANA VITRECTOMY  06/26/2011   Procedure: PARS PLANA VITRECTOMY WITH 23 GAUGE;  Surgeon: Adonis Brook, MD;  Location: [facility];  Service: Ophthalmology;  Laterality: Left;    Current Medications: No outpatient medications have been marked as taking for the 10/12/20 encounter (Appointment) with Leslie Burn, PA-C.     Allergies:   Fenoprofen calcium   Social History   Socioeconomic History   Marital status: Married    Spouse name: [name]   Number of children: 1   Years of education: 20   Highest education level: Not on file  Occupational History   Occupation: Retired  Tobacco Use   Smoking status: Never   Smokeless tobacco: Never  Vaping Use   Vaping Use: Never used  Substance and Sexual Activity   Alcohol use: No   Drug use: No   Sexual activity: Yes    Birth control/protection: Surgical  Other Topics Concern   Not on file  Social History Narrative   Lives with husband   Caffeine use: Tea rare   Social Determinants of Health   Financial Resource Strain: Not on file  Food Insecurity: Not on file  Transportation Needs: Not on file  Physical Activity: Not on file  Stress: Not on file  Social Connections: Not on file     Family History:  The patient's ***family history includes Breast cancer in her sister; Colon cancer in her mother; Heart attack in her father; Heart disease in her mother.   ROS:   Please see the history of present illness.    ROS All other systems reviewed and are negative.   PHYSICAL EXAM:   VS:  There were no vitals taken for this visit.  Physical Exam  GEN: Well nourished, well developed, in no acute distress  HEENT: normal  Neck: no JVD, carotid bruits, or masses Cardiac:RRR; no murmurs, rubs, or gallops  Respiratory:  clear to auscultation bilaterally, normal work of breathing GI: soft, nontender, nondistended, + BS Ext: without cyanosis, clubbing, or edema, Good  distal pulses bilaterally MS: no deformity or atrophy  Skin: warm and dry, no rash Neuro:  Alert and Oriented x 3, Strength and sensation are intact Psych: euthymic mood, full affect  Wt Readings from Last 3 Encounters:  07/29/20 222 lb (100.7 kg)  07/07/20 222 lb (100.7 kg)  06/30/20 219 lb (99.3 kg)      Studies/Labs Reviewed:   EKG:  EKG is*** ordered today.  The ekg ordered today demonstrates ***  Recent Labs: 04/06/2020: Hemoglobin 11.7; Platelets 178.0 07/07/2020: BUN 14; Creatinine, Ser 1.09; NT-Pro BNP 196; Potassium 3.8; Sodium 142   Lipid Panel    Component Value Date/Time   CHOL 129 10/31/2018 0906   TRIG 59 10/31/2018 0906   HDL 55 10/31/2018 0906   CHOLHDL 2.3 10/31/2018 0906   CHOLHDL 2.3 07/07/2015 0913   VLDL 13 07/07/2015 0913   LDLCALC 61 10/31/2018 0906    Additional studies/ records that were reviewed today include:  Carotid Dopplers 07/29/2020 lSummary:  Right Carotid: The extracranial vessels were near-normal with only minimal  wall                thickening or plaque.   Left Carotid: The extracranial vessels were near-normal with only minimal  wall               thickening or plaque.   Vertebrals:  Bilateral vertebral arteries demonstrate antegrade flow.  Subclavians: Normal flow hemodynamics were seen in bilateral subclavian               arteries.   *See table(s) above for measurements and observations.      Electronically signed by Larae Grooms MD on 07/29/2020 at 10:37:37 PM.        Final    2D echo 09/13/2020 IMPRESSIONS     1. Left ventricular ejection fraction by 3D volume is 58 %. The left  ventricle has normal function. The left ventricle has no regional wall  motion abnormalities. There is mild left ventricular hypertrophy. Left  ventricular diastolic parameters are  consistent with Grade II diastolic dysfunction (pseudonormalization).   2. Right ventricular systolic function is normal. The right ventricular  size is  normal. There is normal pulmonary artery systolic pressure. The  estimated right ventricular systolic pressure is [id number] mmHg.   3. The mitral valve is grossly normal. Mild to moderate mitral valve  regurgitation. No evidence of mitral stenosis.   4. Tricuspid valve regurgitation is mild to moderate.   5. The aortic valve is grossly normal. Aortic valve regurgitation is mild  to moderate. Mild aortic valve sclerosis is present, with no evidence of  aortic valve stenosis. Aortic valve mean gradient measures  8.5 mmHg.   6. The inferior vena cava is normal in size with greater than 50%  respiratory variability, suggesting right atrial pressure of 3 mmHg.   Lexiscan Myoview 6/9/2022Study Highlights    The left ventricular ejection fraction is hyperdynamic (>65%). Nuclear stress EF: 71%. There was no ST segment deviation noted during stress. The study is normal. This is a low risk study.   No ischemia or infarction on perfusion images. Normal wall motion.     Risk Assessment/Calculations:   {Does this patient have ATRIAL FIBRILLATION?[phone]}     ASSESSMENT:    1. Coronary artery disease involving native coronary artery of native heart without angina pectoris   2. Bilateral carotid bruits   3. Chronic diastolic CHF (congestive heart failure) ([place])   4. Essential hypertension   5. Mitral valve insufficiency, unspecified etiology   6. Hyperlipidemia, unspecified hyperlipidemia type      PLAN:  In order of problems listed above:  CAD status post PCI to the LAD and diagonal 03/2006 recent chest pain but Lexiscan Myoview without ischemia 07/29/2020.  Echo with normal LVEF  Family history of carotid dissection carotid ultrasound normal 07/29/2020  Chronic lower extremity edema and diastolic CHF Echo with grade 2 DD  Hypertension  Valvular heart disease with mild to moderate MR, mild to moderate TR, mild to moderate AI on echo 09/13/2020 normal LVEF with grade 2 DD  HLD  Shared  Decision Making/Informed Consent   {Are you ordering a CV Procedure (e.g. stress test, cath, DCCV, TEE, etc)?   Press F2        :[id number]    Medication Adjustments/Labs and Tests Ordered: Current medicines are reviewed at length with the patient today.  Concerns regarding medicines are outlined above.  Medication changes, Labs and Tests ordered today are listed in the Patient Instructions below. There are no Patient Instructions on file for this visit.   Sumner Boast, PA-C  10/05/2020 1:06 PM    [facility] [address], [place], [place]  [address] Phone: [phone]; Fax: [phone]

## 2020-10-12 ENCOUNTER — Ambulatory Visit: Payer: Medicare PPO | Admitting: Physician Assistant

## 2020-10-12 DIAGNOSIS — I10 Essential (primary) hypertension: Secondary | ICD-10-CM

## 2020-10-12 DIAGNOSIS — I5032 Chronic diastolic (congestive) heart failure: Secondary | ICD-10-CM

## 2020-10-12 DIAGNOSIS — I2510 Atherosclerotic heart disease of native coronary artery without angina pectoris: Secondary | ICD-10-CM

## 2020-10-12 DIAGNOSIS — R0989 Other specified symptoms and signs involving the circulatory and respiratory systems: Secondary | ICD-10-CM

## 2020-10-12 DIAGNOSIS — E785 Hyperlipidemia, unspecified: Secondary | ICD-10-CM

## 2020-10-12 DIAGNOSIS — I340 Nonrheumatic mitral (valve) insufficiency: Secondary | ICD-10-CM

## 2020-11-11 ENCOUNTER — Other Ambulatory Visit: Payer: Self-pay

## 2020-11-11 ENCOUNTER — Ambulatory Visit (HOSPITAL_BASED_OUTPATIENT_CLINIC_OR_DEPARTMENT_OTHER): Payer: Medicare PPO | Admitting: Family

## 2020-11-11 ENCOUNTER — Encounter (HOSPITAL_BASED_OUTPATIENT_CLINIC_OR_DEPARTMENT_OTHER): Payer: Self-pay | Admitting: Family

## 2020-11-11 VITALS — BP 120/68 | HR 58 | Ht 68.0 in | Wt 213.0 lb

## 2020-11-11 DIAGNOSIS — I351 Nonrheumatic aortic (valve) insufficiency: Secondary | ICD-10-CM | POA: Diagnosis not present

## 2020-11-11 DIAGNOSIS — I7771 Dissection of carotid artery: Secondary | ICD-10-CM

## 2020-11-11 DIAGNOSIS — I872 Venous insufficiency (chronic) (peripheral): Secondary | ICD-10-CM

## 2020-11-11 DIAGNOSIS — E785 Hyperlipidemia, unspecified: Secondary | ICD-10-CM | POA: Diagnosis not present

## 2020-11-11 DIAGNOSIS — I5189 Other ill-defined heart diseases: Secondary | ICD-10-CM | POA: Diagnosis not present

## 2020-11-11 NOTE — Progress Notes (Signed)
Office Visit    Patient Name: [name] Date of Encounter: 11/11/2020  PCP:  [name], MD   [facility]  Cardiologist:  [name], MD  Advanced Practice Provider:  No care team member to display Electrophysiologist:  None   Chief Complaint    [name] is a 75 y.o. 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 remote LICA dissection.   CKD (chronic kidney disease), stage III (HCC)    Coronary artery disease    a. remote stents to LAD and large diagonal branch. b. PTCA of diag for ISR 2007. c. PTCA/DES to diagonal and PTCA of mid LAD 01/2007.   Fibromuscular dysplasia (HCC)    GERD (gastroesophageal reflux disease)    Headache(784.0)    hx of   Heart murmur    Hyperlipidemia    Hypertension    Internal hemorrhoids without mention of complication    Lower extremity edema    Lower extremity edema    Mitral regurgitation    mild to moderate by echo 08/2020   Neutropenia, unspecified (HCC)    Obesity    Premature atrial contractions    PVC's (premature ventricular contractions)    Sinus brady-tachy syndrome (HCC)    Stroke (HCC)    Tricuspid regurgitation    mild to moderate by echo 08/2020   Past Surgical History:  Procedure Laterality Date   ABDOMINAL HYSTERECTOMY     CARDIAC CATHETERIZATION  2008   Cardiac Stents  2008   COLONOSCOPY  2010   normal    gallstones removed     PARS PLANA VITRECTOMY  06/26/2011   Procedure: PARS PLANA VITRECTOMY WITH 23 GAUGE;  Surgeon: Adonis Brook, MD;  Location: [facility];  Service: Ophthalmology;  Laterality: Left;    Allergies  Allergies   Allergen Reactions   Fenoprofen Calcium Other (See Comments)    Stopped kidneys from functioning Stopped kidneys from functioning    History of Present Illness    [name] is a 75 y.o. 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 Johney Frame.  Previous cardiac testing includes Myoview 2017 which was negative. Holter 2017 with frequent PVC, bigeminy, SB. Repeat Holter 2018 with improvement. Echo 08/2019 LVEF 70%, global longitudinal strain -23%, mild to moderate AI and mild MR. Lasix was increased with improvement in LE edema.   She was seen 07/07/20 for chest pain and recommended for stress test which was slow risk study with no ischemia. She presents today for follow up. Went to [place] this summer and enjoyed her trip. 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 She is very involved in their food pantry and [employer]. Reports intermittent episodes of lightheadedness with quick position changes but no syncope. Notes occasional chest discomfort every once in awhile but often self resolves. . Tooks Tums which helped with chest discomfort. Edema is stable  at her baseline - we discussed diastolic dysfunction and venous insufficiency.   EKGs/Labs/Other Studies Reviewed:   The following studies were reviewed today:  Echo 09/13/20 1. Left ventricular ejection fraction by 3D volume is 58 %. The left  ventricle has normal function. The left ventricle has no regional wall  motion abnormalities. There is mild left ventricular hypertrophy. Left  ventricular diastolic parameters are  consistent with Grade II diastolic dysfunction (pseudonormalization).   2. Right ventricular systolic function is normal. The right ventricular  size is normal. There is normal  pulmonary artery systolic pressure. The  estimated right ventricular systolic pressure is 10.2 mmHg.   3. The mitral valve is grossly normal. Mild to moderate mitral valve  regurgitation. No evidence of mitral stenosis.   4. Tricuspid valve regurgitation is mild to moderate.   5. The aortic valve is grossly normal. Aortic valve regurgitation is mild  to moderate. Mild aortic valve sclerosis is present, with no evidence of  aortic valve stenosis. Aortic valve mean gradient measures 8.5 mmHg.   6. The inferior vena cava is normal in size with greater than 50%  respiratory variability, suggesting right atrial pressure of 3 mmHg.   Carotid duplex 07/29/20 Right Carotid: The extracranial vessels were near-normal with only minimal  wall                thickening or plaque.   Left Carotid: The extracranial vessels were near-normal with only minimal  wall               thickening or plaque.   Vertebrals:  Bilateral vertebral arteries demonstrate antegrade flow.  Subclavians: Normal flow hemodynamics were seen in bilateral subclavian               arteries.   *See table(s) above for measurements and observations.    Myoview 07/29/20 The left ventricular ejection fraction is hyperdynamic (>65%). Nuclear stress EF: 71%. There was no ST segment deviation noted during stress. The study is normal. This is a low risk study.   No ischemia or infarction on perfusion images. Normal wall motion.    EKG:  No EKG today  Recent Labs: 04/06/2020: Hemoglobin 11.7; Platelets 178.0 07/07/2020: BUN 14; Creatinine, Ser 1.09; NT-Pro BNP 196; Potassium 3.8; Sodium 142  Recent Lipid Panel    Component Value Date/Time   CHOL 129 10/31/2018 0906   TRIG 59 10/31/2018 0906   HDL 55 10/31/2018 0906   CHOLHDL 2.3 10/31/2018 0906   CHOLHDL 2.3 07/07/2015 0913   VLDL 13 07/07/2015 0913   LDLCALC 61 10/31/2018 0906    Home Medications   Current Meds  Medication Sig   atorvastatin (LIPITOR) 40 MG tablet  TAKE 1 TABLET BY MOUTH EVERY DAY   clopidogrel (PLAVIX) 75 MG tablet Take 1 tablet (75 mg total) by mouth daily.   furosemide (LASIX) 20 MG tablet Take 1 tablet (20 mg total) by mouth daily.   pantoprazole (PROTONIX) 40 MG tablet Take 40 mg by mouth daily.   valsartan (DIOVAN) 80 MG tablet Take 1 tablet (80 mg total) by mouth daily.   Vitamin D, Ergocalciferol, (DRISDOL) 1.25 MG (50000 UNIT) CAPS capsule Take 50,000 Units by mouth once a week.     Review of Systems      All other systems reviewed and are otherwise negative except as noted above.  Physical Exam    VS:  BP 120/68   Pulse (!) 58   Ht 5\' 8"  (1.727 m)   Wt 213  lb (96.6 kg)   SpO2 97%   BMI 32.39 kg/m  , BMI Body mass index is 32.39 kg/m.  Wt Readings from Last 3 Encounters:  11/11/20 213 lb (96.6 kg)  07/29/20 222 lb (100.7 kg)  07/07/20 222 lb (100.7 kg)     GEN: Well nourished, well developed, in no acute distress. HEENT: normal. Neck: Supple, no JVD, carotid bruits, or masses. Cardiac: RRR, no murmurs, rubs, or gallops. No clubbing, cyanosis. Bilateral LE with non pitting pretibial edema.  Radials/PT 2+ and equal bilaterally.  Respiratory:  Respirations regular and unlabored, clear to auscultation bilaterally. GI: Soft, nontender, nondistended. MS: No deformity or atrophy. Skin: Warm and dry, no rash. Neuro:  Strength and sensation are intact. Psych: Normal affect.  Assessment & Plan    CAD s/p PCI to LAD and D2 in 2008 - [place] [facility] 07/2020 no ischemic. Stable with no anginal symptoms. No indication for ischemic evaluation.  GDMT includes  lipitor, plavix. Heart healthy diet and regular cardiovascular exercise encouraged.    FMD with history of carotid dissection - Carotid duplex 07/2020 with no significant stenosis. Continue aspirin, statin.   LE edema / venous insufficiency / gr2 diastolic dysfunction - Bilateral LE with 1+ pretibial edema. Likely multifactorial venous insufficiency, gr2DD. Endrses not  taking Lasix daily. Encouraged to take daily and make extra 20mg  in the afternoon PRN once per week for worsening edema. Compression socks and elevation of lower extremities encouraged.   Moderate AI - Stable by echo 07/2020. Repeat echo 1 year for monitoring. Continue optimal BP control.   Disposition: Follow up in 6 month(s) with Dr. Johney Frame or APP.  Signed, Loel Dubonnet, NP 11/11/2020, 12:05 PM [place]

## 2020-11-11 NOTE — Patient Instructions (Signed)
Medication Instructions:  Your physician has recommended you make the following change in your medication:   CONTINUE your Lasix 20mg  daily for swelling  *You may take an additional 20mg  tablet in the afternoon once per week as needed for swelling*  *If you need a refill on your cardiac medications before your next appointment, please call your pharmacy*  Lab Work: None ordered today.   Testing/Procedures: None ordered today.   Your recent carotid duplex showed no blockages from in your carotid arteries.   Your echocardiogram done recently was stable compared to previous. It shows your heart is pumping strong but was mildly stiff which is very common.   Your recent stress test looked great! No evidence of blockage.   Follow-Up: At [facility], you and your health needs are our priority.  As part of our continuing mission to provide you with exceptional heart care, we have created designated Provider Care Teams.  These Care Teams include your primary Cardiologist (physician) and Advanced Practice Providers (APPs -  Physician Assistants and Nurse Practitioners) who all work together to provide you with the care you need, when you need it.  We recommend signing up for the patient portal called "MyChart".  Sign up information is provided on this After Visit Summary.  MyChart is used to connect with patients for Virtual Visits (Telemedicine).  Patients are able to view lab/test results, encounter notes, upcoming appointments, etc.  Non-urgent messages can be sent to your provider as well.   To learn more about what you can do with MyChart, go to [email or web address].    Your next appointment:   6 month(s)  The format for your next appointment:   In Person  Provider:   You may see [name], MD or one of the following Advanced Practice Providers on your designated Care Team:   Richardson Dopp, PA-C [place], [place]   Other Instructions  To prevent or reduce lower  extremity swelling: Eat a low salt diet. Salt makes the body hold onto extra fluid which causes swelling. Sit with legs elevated. For example, in the recliner or on an [place].  Wear knee-high compression stockings during the daytime. Ones labeled 15-20 mmHg provide good compression.  Chronic Venous Insufficiency Chronic venous insufficiency is a condition where the leg veins cannot effectively pump blood from the legs to the heart. This happens when the vein walls are either stretched, weakened, or damaged, or when the valves inside the vein are damaged. With the right treatment, you should be able to continue with an active life. This condition is also called venous stasis. What are the causes? Common causes of this condition include: High blood pressure inside the veins (venous hypertension). Sitting or standing too long, causing increased blood pressure in the leg veins. A blood clot that blocks blood flow in a vein (deep vein thrombosis, DVT). Inflammation of a vein (phlebitis) that causes a blood clot to form. Tumors in the pelvis that cause blood to back up. What increases the risk? The following factors may make you more likely to develop this condition: Having a family history of this condition. Obesity. Pregnancy. Living without enough regular physical activity or exercise (sedentary lifestyle). Smoking. Having a job that requires long periods of standing or sitting in one place. Being a certain age. Women in their 69s and 11s and men in their 7s are more likely to develop this condition. What are the signs or symptoms? Symptoms of this condition include: Veins that are enlarged, bulging,  or twisted (varicose veins). Skin breakdown or ulcers. Reddened skin or dark discoloration of skin on the leg between the knee and ankle. Brown, smooth, tight, and painful skin just above the ankle, usually on the inside of the leg (lipodermatosclerosis). Swelling of the legs. How is this  diagnosed? This condition may be diagnosed based on: Your medical history. A physical exam. Tests, such as: A procedure that creates an image of a blood vessel and nearby organs and provides information about blood flow through the blood vessel (duplex ultrasound). A procedure that tests blood flow (plethysmography). A procedure that looks at the veins using X-ray and dye (venogram). How is this treated? The goals of treatment are to help you return to an active life and to minimize pain or disability. Treatment depends on the severity of your condition, and it may include: Wearing compression stockings. These can help relieve symptoms and help prevent your condition from getting worse. However, they do not cure the condition. Sclerotherapy. This procedure involves an injection of a solution that shrinks damaged veins. Surgery. This may involve: Removing a diseased vein (vein stripping). Cutting off blood flow through the vein (laser ablation surgery). Repairing or reconstructing a valve within the affected vein. Follow these instructions at home:   Wear compression stockings as told by your health care provider. These stockings help to prevent blood clots and reduce swelling in your legs. Take over-the-counter and prescription medicines only as told by your health care provider. Stay active by exercising, walking, or doing different activities. Ask your health care provider what activities are safe for you and how much exercise you need. Drink enough fluid to keep your urine pale yellow. Do not use any products that contain nicotine or tobacco, such as cigarettes, e-cigarettes, and chewing tobacco. If you need help quitting, ask your health care provider. Keep all follow-up visits as told by your health care provider. This is important. Contact a health care provider if you: Have redness, swelling, or more pain in the affected area. See a red streak or line that goes up or down from the  affected area. Have skin breakdown or skin loss in the affected area, even if the breakdown is small. Get an injury in the affected area. Get help right away if: You get an injury and an open wound in the affected area. You have: Severe pain that does not get better with medicine. Sudden numbness or weakness in the foot or ankle below the affected area. Trouble moving your foot or ankle. A fever. Worse or persistent symptoms. Chest pain. Shortness of breath. Summary Chronic venous insufficiency is a condition where the leg veins cannot effectively pump blood from the legs to the heart. Chronic venous insufficiency occurs when the vein walls become stretched, weakened, or damaged, or when valves within the vein are damaged. Treatment depends on how severe your condition is. It often involves wearing compression stockings and may involve having a procedure. Make sure you stay active by exercising, walking, or doing different activities. Ask your health care provider what activities are safe for you and how much exercise you need. This information is not intended to replace advice given to you by your health care provider. Make sure you discuss any questions you have with your health care provider. Document Revised: 04/20/2020 Document Reviewed: 04/20/2020 Elsevier Patient Education  [place].

## 2020-11-12 ENCOUNTER — Encounter (HOSPITAL_BASED_OUTPATIENT_CLINIC_OR_DEPARTMENT_OTHER): Payer: Self-pay

## 2021-01-17 ENCOUNTER — Other Ambulatory Visit: Payer: Self-pay

## 2021-01-17 MED ORDER — FUROSEMIDE 20 MG PO TABS: 20.0000 mg | ORAL_TABLET | Freq: Every day | ORAL | 3 refills | Status: DC

## 2021-01-31 ENCOUNTER — Other Ambulatory Visit: Payer: Self-pay

## 2021-01-31 MED ORDER — ATORVASTATIN CALCIUM 40 MG PO TABS: 40.0000 mg | ORAL_TABLET | Freq: Every day | ORAL | 1 refills | Status: DC

## 2021-02-22 DIAGNOSIS — E7801 Familial hypercholesterolemia: Secondary | ICD-10-CM | POA: Diagnosis not present

## 2021-02-22 DIAGNOSIS — D72819 Decreased white blood cell count, unspecified: Secondary | ICD-10-CM | POA: Diagnosis not present

## 2021-02-24 DIAGNOSIS — M7989 Other specified soft tissue disorders: Secondary | ICD-10-CM | POA: Diagnosis not present

## 2021-02-24 DIAGNOSIS — I129 Hypertensive chronic kidney disease with stage 1 through stage 4 chronic kidney disease, or unspecified chronic kidney disease: Secondary | ICD-10-CM | POA: Diagnosis not present

## 2021-02-24 DIAGNOSIS — E785 Hyperlipidemia, unspecified: Secondary | ICD-10-CM | POA: Diagnosis not present

## 2021-02-24 DIAGNOSIS — N1831 Chronic kidney disease, stage 3a: Secondary | ICD-10-CM | POA: Diagnosis not present

## 2021-02-24 DIAGNOSIS — I773 Arterial fibromuscular dysplasia: Secondary | ICD-10-CM | POA: Diagnosis not present

## 2021-03-01 DIAGNOSIS — E7800 Pure hypercholesterolemia, unspecified: Secondary | ICD-10-CM | POA: Diagnosis not present

## 2021-03-01 DIAGNOSIS — D72819 Decreased white blood cell count, unspecified: Secondary | ICD-10-CM | POA: Diagnosis not present

## 2021-03-01 DIAGNOSIS — I10 Essential (primary) hypertension: Secondary | ICD-10-CM | POA: Diagnosis not present

## 2021-03-01 DIAGNOSIS — K219 Gastro-esophageal reflux disease without esophagitis: Secondary | ICD-10-CM | POA: Diagnosis not present

## 2021-03-01 DIAGNOSIS — N183 Chronic kidney disease, stage 3 unspecified: Secondary | ICD-10-CM | POA: Diagnosis not present

## 2021-03-01 DIAGNOSIS — I2510 Atherosclerotic heart disease of native coronary artery without angina pectoris: Secondary | ICD-10-CM | POA: Diagnosis not present

## 2021-03-01 DIAGNOSIS — E559 Vitamin D deficiency, unspecified: Secondary | ICD-10-CM | POA: Diagnosis not present

## 2021-04-26 DIAGNOSIS — Z1231 Encounter for screening mammogram for malignant neoplasm of breast: Secondary | ICD-10-CM | POA: Diagnosis not present

## 2021-05-09 NOTE — Progress Notes (Deleted)
?Cardiology Office Note:   ? ?Date:  05/09/2021  ? ?ID:  [name], DOB [birth year], MRN [id number] ? ?PCP:  [name], MD ?  ?[facility] Providers ?Cardiologist:  [name], MD {  ? ?Referring MD: [name], MD  ? ? ?History of Present Illness:   ? ?[name] is a 76 y.o. 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 Meda Coffee who now returns to clinic for follow-up. ? ?Per review of the record, the patient has history of myoview in 2017 for SOB which was negative for infarct or ischemia with normal LVEF. Also had Holter in 2017 palpitations that showed frequent PVCs, bigminy and sinus brady. Repeat holter in 2018 with significant improvement. During visit with Dr. Meda Coffee on 08/2019, she was experiencing worsening LE edema. TTE showed LVEF of 70%, global longitudinal strain -23%, aortic insufficiency mild to moderate and mild mitral regurgitation. Her lasix was increased at that visit to '40mg'$  daily with improvement. ? ?Called clinic on 07/06/20 where she had an episode of chest pain while using the bathroom that radiated to her left arm. Symptoms lasted about 50mn. Myoview 07/2020 with no ischemia or infarction. TTE 08/2020 with LVEF 58%, G2DD, mild-to-moderate MR, mild-to-moderate TR, mild-to-moderate AR, aortic sclerosis.  ? ?Saw [facility] 10/2020 where she was doing well. Occasional chest discomfort that resolved with Tums. ? ?Past Medical History:  ?Diagnosis Date  ? Aortic insufficiency   ? mild to moderate by echo 08/2020  ? Carotid artery dissection (HCC)   ? a. remote LICA dissection.  ? CKD (chronic kidney disease), stage III ([facility]   ? Coronary artery disease   ? a. remote stents to LAD and large diagonal branch. b. PTCA of diag for ISR 2007. c. PTCA/DES to diagonal and PTCA of mid LAD 01/2007.  ?  Fibromuscular dysplasia ([facility]   ? GERD (gastroesophageal reflux disease)   ? Headache(784.0)   ? hx of  ? Heart murmur   ? Hyperlipidemia   ? Hypertension   ? Internal hemorrhoids without mention of complication   ? Lower extremity edema   ? Lower extremity edema   ? Mitral regurgitation   ? mild to moderate by echo 08/2020  ? Neutropenia, unspecified ([facility]   ? Obesity   ? Premature atrial contractions   ? PVC's (premature ventricular contractions)   ? Sinus brady-tachy syndrome ([facility]   ? Stroke ([facility]   ? Tricuspid regurgitation   ? mild to moderate by echo 08/2020  ? ? ?Past Surgical History:  ?Procedure Laterality Date  ? ABDOMINAL HYSTERECTOMY    ? CARDIAC CATHETERIZATION  2008  ? Cardiac Stents  2008  ? COLONOSCOPY  2010  ? normal   ? gallstones removed    ? PARS PLANA VITRECTOMY  06/26/2011  ? Procedure: PARS PLANA VITRECTOMY WITH 23 GAUGE;  Surgeon: GAdonis Brook MD;  [place]  Service: Ophthalmology;  Laterality: Left;  ? ? ?Current Medications: ?No outpatient medications have been marked as taking for the 05/11/21 encounter (Appointment) with PFreada Bergeron MD.  ?  ? ?Allergies:   Fenoprofen calcium  ? ?Social History  ? ?Socioeconomic History  ? Marital status: Married  ?  Spouse name: AArnell Sieving ? Number of children: 1  ? Years of education: 251 ? Highest education level: Not on  file  ?Occupational History  ? Occupation: Retired  ?Tobacco Use  ? Smoking status: Never  ? Smokeless tobacco: Never  ?Vaping Use  ? Vaping Use: Never used  ?Substance and Sexual Activity  ? Alcohol use: No  ? Drug use: No  ? Sexual activity: Yes  ?  Birth control/protection: Surgical  ?Other Topics Concern  ? Not on file  ?Social History Narrative  ? Lives with husband  ? Caffeine use: Tea rare  ? ?Social Determinants of Health  ? ?Financial Resource Strain: Not on file  ?Food Insecurity: Not on file  ?Transportation Needs: Not on file  ?Physical Activity: Not on file  ?Stress: Not on file  ?Social Connections: Not on file   ?  ? ?Family History: ?The patient's family history includes Breast cancer in her sister; Colon cancer in her mother; Heart attack in her father; Heart disease in her mother. There is no history of Anesthesia problems, Hypotension, Malignant hyperthermia, Pseudochol deficiency, Esophageal cancer, Rectal cancer, or Stomach cancer. ? ?ROS:   ?Review of Systems  ?Constitutional:  Negative for chills, diaphoresis and fever.  ?HENT:  Negative for congestion and sore throat.   ?Respiratory:  Negative for shortness of breath and wheezing.   ?Cardiovascular:  Positive for chest pain (pressure), palpitations (occasional) and leg swelling. Negative for orthopnea.  ?Gastrointestinal:  Negative for abdominal pain, blood in stool, diarrhea, heartburn, nausea and vomiting.  ?Genitourinary:  Negative for dysuria, hematuria and urgency.  ?Neurological:  Positive for dizziness. Negative for headaches.  ? ? ?EKGs/Labs/Other Studies Reviewed:   ? ?The following studies were reviewed today: ?TTE 08/2020: ?IMPRESSIONS  ? 1. Left ventricular ejection fraction by 3D volume is 58 %. The left  ?ventricle has normal function. The left ventricle has no regional wall  ?motion abnormalities. There is mild left ventricular hypertrophy. Left  ?ventricular diastolic parameters are  ?consistent with Grade II diastolic dysfunction (pseudonormalization).  ? 2. Right ventricular systolic function is normal. The right ventricular  ?size is normal. There is normal pulmonary artery systolic pressure. The  ?estimated right ventricular systolic pressure is 14.4 mmHg.  ? 3. The mitral valve is grossly normal. Mild to moderate mitral valve  ?regurgitation. No evidence of mitral stenosis.  ? 4. Tricuspid valve regurgitation is mild to moderate.  ? 5. The aortic valve is grossly normal. Aortic valve regurgitation is mild  ?to moderate. Mild aortic valve sclerosis is present, with no evidence of  ?aortic valve stenosis. Aortic valve mean gradient measures 8.5  mmHg.  ? 6. The inferior vena cava is normal in size with greater than 50%  ?respiratory variability, suggesting right atrial pressure of 3 mmHg.  ? ?Myoview 07/2020: ?The left ventricular ejection fraction is hyperdynamic (>65%). ?Nuclear stress EF: 71%. ?There was no ST segment deviation noted during stress. ?The study is normal. ?This is a low risk study. ?  ?No ischemia or infarction on perfusion images. Normal wall motion.  ? ?TTE 09/15/19: ?IMPRESSIONS  ? ? ? 1. Global longitudinal strain is -23.1%. Left ventricular ejection  ?fraction, by estimation, is 65 to 70%. The left ventricle has normal  ?function. The left ventricle has no regional wall motion abnormalities.  ?Left ventricular diastolic parameters were  ?normal.  ? 2. Right ventricular systolic function is normal. The right ventricular  ?size is normal. There is normal pulmonary artery systolic pressure.  ? 3. There are 2 prominent jets of MR. Mild mitral valve regurgitation.  ? 4. Compared to report from previous echo, aortic  valve insufficiency is  ?more prominent. No images to compare. The aortic valve is tricuspid.  ?Aortic valve regurgitation is mild. Mild to moderate aortic valve  ?sclerosis/calcification is present, without  ?any evidence of aortic stenosis.  ? ?Comparison(s): 08/13/15 EF 60-65%. ? ?Holter 2018: ? ?Sinus bradycardia to sinus rhythm. ?No AV blocks or pauses > 2 seconds. ?Frequent PACs, infrequent PVCs. ?  ?No AV blocks or pauses > 2 seconds. ?No diary provided.  ?No reason for cardiac source of dizziness was identified.  ? ?Myoview 2017: ?Nuclear stress EF: 71%. ?There was 90m of horizontal ST segment depression in the lateral precordial leads that became downsloping in recovery. Patient had submaximal exercise treadmill test and was change to a Lexiscan. ?The nuclear images are normal. ?This is a low risk study. ?The left ventricular ejection fraction is hyperdynamic (>65%). ? ?Holter 2017: ?Sinus bradycardia to sinus  rhythm. ?Frequent PVCs and bigeminy, few triplets. ?Infrequent PACs. ? ? ? ?EKG:   ?07/07/20-Sinus Bradycardia, rate: 85 bpm   ? ?Recent Labs: ?07/07/2020: BUN 14; Creatinine, Ser 1.09; NT-Pro BNP 196; Potassium 3.

## 2021-05-10 NOTE — Progress Notes (Signed)
?Cardiology Office Note:   ? ?Date:  05/11/2021  ? ?ID:  [name], DOB 13-Jul-1945, MRN [id number] ? ?PCP:  [name], MD ?  ?[facility] Providers ?Cardiologist:  [name], MD {  ? ?Referring MD: [name], MD  ? ? ?History of Present Illness:   ? ?[name] is a 76 y.o. 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 Meda Coffee who now returns to clinic for follow-up. ? ?Per review of the record, the patient has history of myoview in 2017 for SOB which was negative for infarct or ischemia with normal LVEF. Also had Holter in 2017 palpitations that showed frequent PVCs, bigminy and sinus brady. Repeat holter in 2018 with significant improvement. During visit with Dr. Meda Coffee on 08/2019, she was experiencing worsening LE edema. TTE showed LVEF of 70%, global longitudinal strain -23%, aortic insufficiency mild to moderate and mild mitral regurgitation. Her lasix was increased at that visit to '40mg'$  daily with improvement. ? ?Called clinic on 07/06/20 where she had an episode of chest pain while using the bathroom that radiated to her left arm. Symptoms lasted about 33mn. Myoview 07/2020 with no ischemia or infarction. TTE 08/2020 with LVEF 58%, G2DD, mild-to-moderate MR, mild-to-moderate TR, mild-to-moderate AR, aortic sclerosis.  ? ?Saw [facility] 10/2020 where she was doing well. Occasional chest discomfort that resolved with Tums. ? ?Today, she is doing well. Her chest pain is less frequent ans is improved with Tums. Continues to have mild dyspnea on exertion and LE edema. She takes Lasix daily but does not notice needing to urinate frequently. She also wears compression socks. She denies palpitations, PND, syncope or presyncope. Admits to not being very active. ? ?She endorses occasional constipation and  feeling off-balance.  ? ?Past Medical History:  ?Diagnosis Date  ? Aortic insufficiency   ? mild to moderate by echo 08/2020  ? Carotid artery dissection (HCC)   ? a. remote LICA dissection.  ? CKD (chronic kidney disease), stage III ([facility]   ? Coronary artery disease   ? a. remote stents to LAD and large diagonal branch. b. PTCA of diag for ISR 2007. c. PTCA/DES to diagonal and PTCA of mid LAD 01/2007.  ? Fibromuscular dysplasia ([facility]   ? GERD (gastroesophageal reflux disease)   ? Headache(784.0)   ? hx of  ? Heart murmur   ? Hyperlipidemia   ? Hypertension   ? Internal hemorrhoids without mention of complication   ? Lower extremity edema   ? Lower extremity edema   ? Mitral regurgitation   ? mild to moderate by echo 08/2020  ? Neutropenia, unspecified ([facility]   ? Obesity   ? Premature atrial contractions   ? PVC's (premature ventricular contractions)   ? Sinus brady-tachy syndrome ([facility] [place]   ? Stroke ([facility]   ? Tricuspid regurgitation   ? mild to moderate by echo 08/2020  ? ? ?Past Surgical History:  ?Procedure Laterality Date  ? ABDOMINAL HYSTERECTOMY    ? CARDIAC CATHETERIZATION  2008  ? Cardiac Stents  2008  ? COLONOSCOPY  2010  ? normal   ? gallstones removed    ? PARS PLANA VITRECTOMY  06/26/2011  ? Procedure: PARS PLANA VITRECTOMY WITH 23 GAUGE;  Surgeon: GAdonis Brook MD;  [place] [facility]  Service: Ophthalmology;  Laterality: Left;  ? ? ?Current  Medications: ?Current Meds  ?Medication Sig  ? atorvastatin (LIPITOR) 40 MG tablet Take 1 tablet (40 mg total) by mouth daily.  ? clopidogrel (PLAVIX) 75 MG tablet Take 1 tablet (75 mg total) by mouth daily.  ? furosemide (LASIX) 20 MG tablet Take 1 tablet (20 mg total) by mouth daily.  ? pantoprazole (PROTONIX) 40 MG tablet Take 40 mg by mouth daily.  ? valsartan (DIOVAN) 80 MG tablet Take 1 tablet (80 mg total) by mouth daily.  ? Vitamin D, Ergocalciferol, (DRISDOL) 1.25 MG (50000 UNIT) CAPS capsule Take 50,000 Units by mouth once a week.  ?  ? ?Allergies:   Fenoprofen  calcium  ? ?Social History  ? ?Socioeconomic History  ? Marital status: Married  ?  Spouse name: [name]  ? Number of children: 1  ? Years of education: 70  ? Highest education level: Not on file  ?Occupational History  ? Occupation: Retired  ?Tobacco Use  ? Smoking status: Never  ? Smokeless tobacco: Never  ?Vaping Use  ? Vaping Use: Never used  ?Substance and Sexual Activity  ? Alcohol use: No  ? Drug use: No  ? Sexual activity: Yes  ?  Birth control/protection: Surgical  ?Other Topics Concern  ? Not on file  ?Social History Narrative  ? Lives with husband  ? Caffeine use: Tea rare  ? ?Social Determinants of Health  ? ?Financial Resource Strain: Not on file  ?Food Insecurity: Not on file  ?Transportation Needs: Not on file  ?Physical Activity: Not on file  ?Stress: Not on file  ?Social Connections: Not on file  ?  ? ?Family History: ?The patient's family history includes Breast cancer in her sister; Colon cancer in her mother; Heart attack in her father; Heart disease in her mother. There is no history of Anesthesia problems, Hypotension, Malignant hyperthermia, Pseudochol deficiency, Esophageal cancer, Rectal cancer, or Stomach cancer. ? ?ROS:   ?Review of Systems  ?Constitutional:  Negative for chills, diaphoresis and fever.  ?HENT:  Positive for sore throat (Dry mouth). Negative for congestion.   ?Eyes:  Negative for blurred vision.  ?Respiratory:  Positive for shortness of breath. Negative for wheezing.   ?Cardiovascular:  Positive for leg swelling (Bilateral). Negative for palpitations, orthopnea, claudication and PND.  ?Gastrointestinal:  Positive for constipation and heartburn. Negative for blood in stool, nausea and vomiting.  ?Genitourinary:  Negative for dysuria, hematuria and urgency.  ?Skin:  Negative for itching and rash.  ?Neurological:  Positive for dizziness ("off-balance"). Negative for headaches.  ?Endo/Heme/Allergies:  Does not bruise/bleed easily.  ?Psychiatric/Behavioral:  The patient is not  nervous/anxious and does not have insomnia.   ? ?EKGs/Labs/Other Studies Reviewed:   ? ?The following studies were reviewed today: ?TTE 08/2020: ?IMPRESSIONS  ? 1. Left ventricular ejection fraction by 3D volume is 58 %. The left ventricle has normal function. The left ventricle has no regional wall motion abnormalities. There is mild left ventricular hypertrophy. Left ventricular diastolic parameters are consistent with Grade II diastolic dysfunction (pseudonormalization).  ? 2. Right ventricular systolic function is normal. The right ventricular size is normal. There is normal pulmonary artery systolic pressure. The estimated right ventricular systolic pressure is 59.2 mmHg.  ? 3. The mitral valve is grossly normal. Mild to moderate mitral valve regurgitation. No evidence of mitral stenosis.  ? 4. Tricuspid valve regurgitation is mild to moderate.  ? 5. The aortic valve is grossly normal. Aortic valve regurgitation is mild to moderate. Mild aortic valve sclerosis is present, with no  evidence of aortic valve stenosis. Aortic valve mean gradient measures 8.5 mmHg.  ? 6. The inferior vena cava is normal in size with greater than 50% respiratory variability, suggesting right atrial pressure of 3 mmHg.  ? ?Myoview 07/2020: ?The left ventricular ejection fraction is hyperdynamic (>65%). ?Nuclear stress EF: 71%. ?There was no ST segment deviation noted during stress. ?The study is normal. ?This is a low risk study. ?No ischemia or infarction on perfusion images. Normal wall motion.  ? ?TTE 09/15/19: ?IMPRESSIONS  ? 1. Global longitudinal strain is -23.1%. Left ventricular ejection fraction, by estimation, is 65 to 70%. The left ventricle has normal function. The left ventricle has no regional wall motion abnormalities. Left ventricular diastolic parameters were normal.  ? 2. Right ventricular systolic function is normal. The right ventricular size is normal. There is normal pulmonary artery systolic pressure.  ? 3. There  are 2 prominent jets of MR. Mild mitral valve regurgitation.  ? 4. Compared to report from previous echo, aortic valve insufficiency is more prominent. No images to compare. The aortic valve is tricuspid. Aortic v

## 2021-05-11 ENCOUNTER — Ambulatory Visit: Payer: Medicare PPO | Admitting: Cardiology

## 2021-05-11 ENCOUNTER — Encounter: Payer: Self-pay | Admitting: Cardiology

## 2021-05-11 ENCOUNTER — Other Ambulatory Visit: Payer: Self-pay

## 2021-05-11 VITALS — BP 132/86 | HR 50 | Ht 68.0 in | Wt 213.0 lb

## 2021-05-11 DIAGNOSIS — I872 Venous insufficiency (chronic) (peripheral): Secondary | ICD-10-CM | POA: Diagnosis not present

## 2021-05-11 DIAGNOSIS — I351 Nonrheumatic aortic (valve) insufficiency: Secondary | ICD-10-CM | POA: Diagnosis not present

## 2021-05-11 DIAGNOSIS — E782 Mixed hyperlipidemia: Secondary | ICD-10-CM | POA: Diagnosis not present

## 2021-05-11 DIAGNOSIS — I340 Nonrheumatic mitral (valve) insufficiency: Secondary | ICD-10-CM | POA: Diagnosis not present

## 2021-05-11 DIAGNOSIS — I2510 Atherosclerotic heart disease of native coronary artery without angina pectoris: Secondary | ICD-10-CM | POA: Diagnosis not present

## 2021-05-11 DIAGNOSIS — I5189 Other ill-defined heart diseases: Secondary | ICD-10-CM | POA: Diagnosis not present

## 2021-05-11 DIAGNOSIS — I7771 Dissection of carotid artery: Secondary | ICD-10-CM

## 2021-05-11 NOTE — Patient Instructions (Signed)
Medication Instructions:  ? ?Your physician recommends that you continue on your current medications as directed. Please refer to the Current Medication list given to you today. ? ?*If you need a refill on your cardiac medications before your next appointment, please call your pharmacy* ? ? ?Follow-Up: ?At [facility], you and your health needs are our priority.  As part of our continuing mission to provide you with exceptional heart care, we have created designated Provider Care Teams.  These Care Teams include your primary Cardiologist (physician) and Advanced Practice Providers (APPs -  Physician Assistants and Nurse Practitioners) who all work together to provide you with the care you need, when you need it. ? ?We recommend signing up for the patient portal called "MyChart".  Sign up information is provided on this After Visit Summary.  MyChart is used to connect with patients for Virtual Visits (Telemedicine).  Patients are able to view lab/test results, encounter notes, upcoming appointments, etc.  Non-urgent messages can be sent to your provider as well.   ?To learn more about what you can do with MyChart, go to [email or web address].   ? ?Your next appointment:   ?6 month(s) ? ?The format for your next appointment:   ?In Person ? ?Provider:   ?[name], MD { ? ? ? ?

## 2021-06-28 DIAGNOSIS — Z961 Presence of intraocular lens: Secondary | ICD-10-CM | POA: Diagnosis not present

## 2021-06-28 DIAGNOSIS — H35371 Puckering of macula, right eye: Secondary | ICD-10-CM | POA: Diagnosis not present

## 2021-06-28 DIAGNOSIS — H35342 Macular cyst, hole, or pseudohole, left eye: Secondary | ICD-10-CM | POA: Diagnosis not present

## 2021-07-15 DIAGNOSIS — H903 Sensorineural hearing loss, bilateral: Secondary | ICD-10-CM | POA: Diagnosis not present

## 2021-07-21 IMAGING — US US EXTREM LOW VENOUS
1 series · 13 of 24 positions shown · non-contrast
Comparison: Bilateral lower extremity venous Doppler ultrasound
09/06/2016 (negative).

CLINICAL DATA: Bilateral lower extremity pain and edema for the
past year, right greater than left. Evaluate for DVT.



[Series 1: us extrem low venous · 0.08mm/px · 13 of 57 slices shown]
[im 1/57]
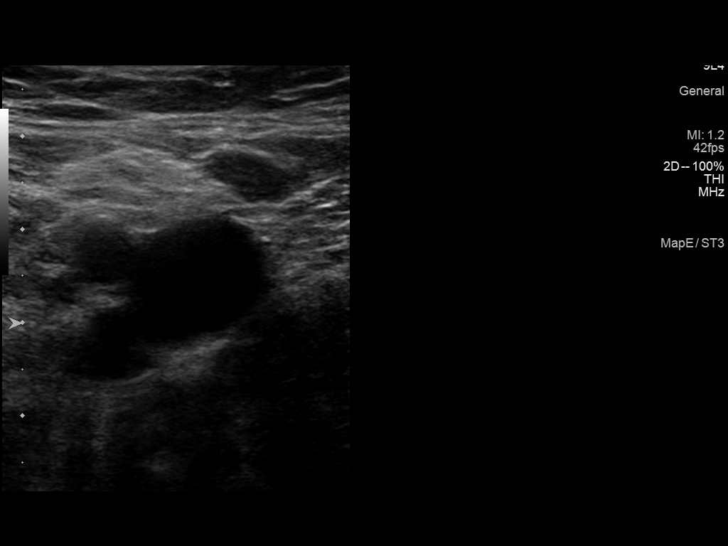
[im 5/57]
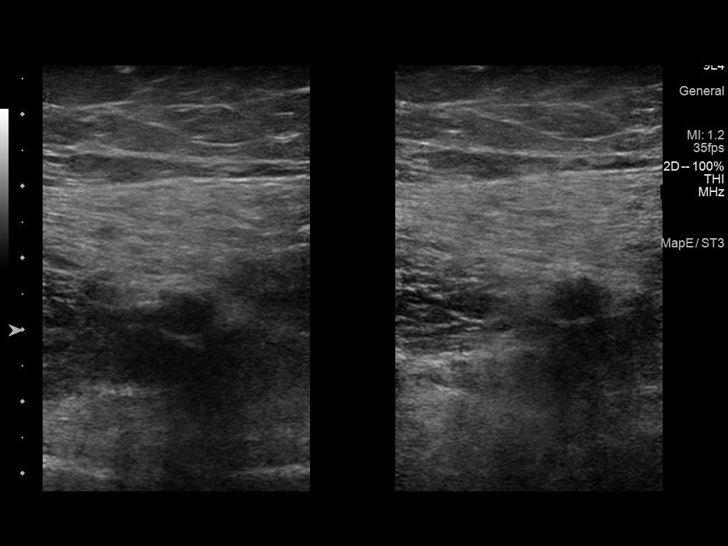
[im 10/57]
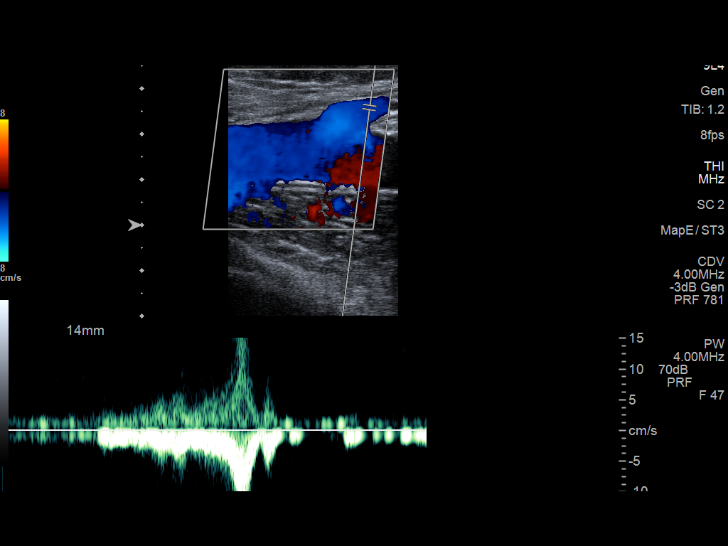
[im 15/57]
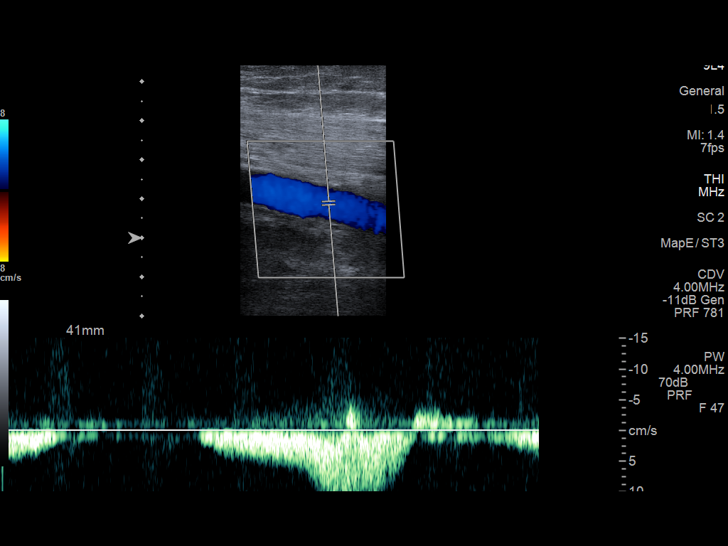
[im 20/57]
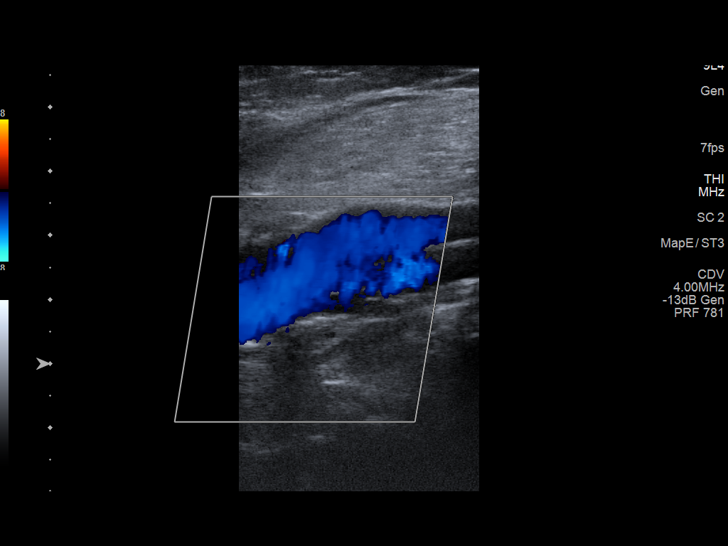
[im 25/57]
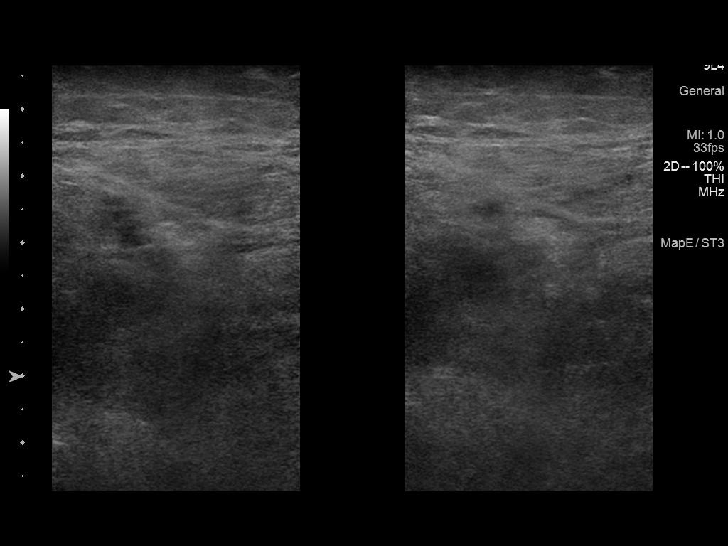
[im 30/57]
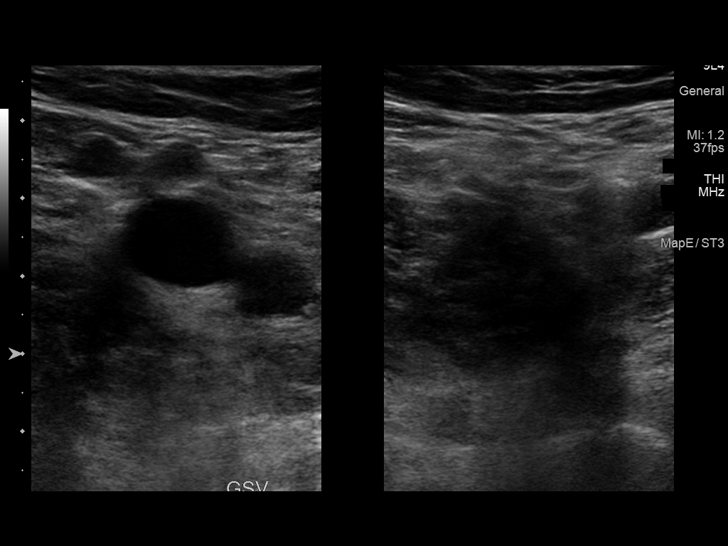
[im 32/57]
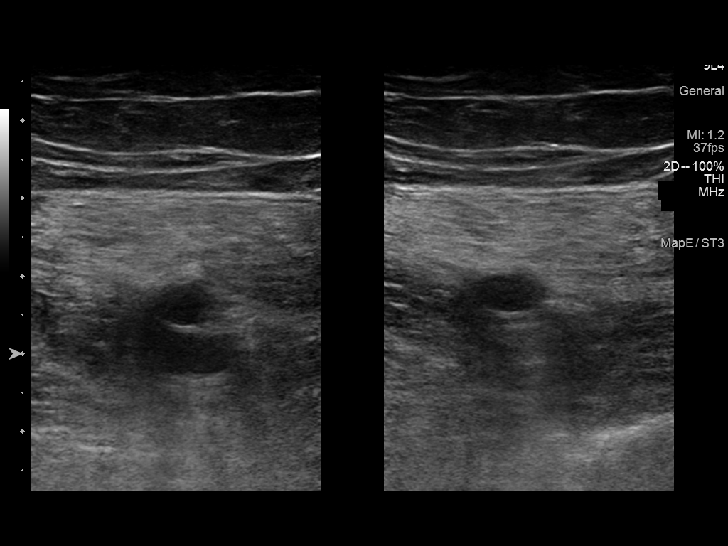
[im 37/57]
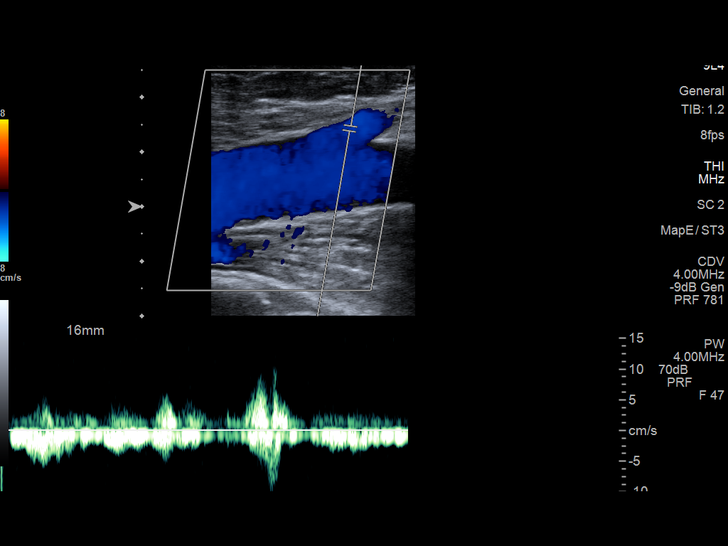
[im 42/57]
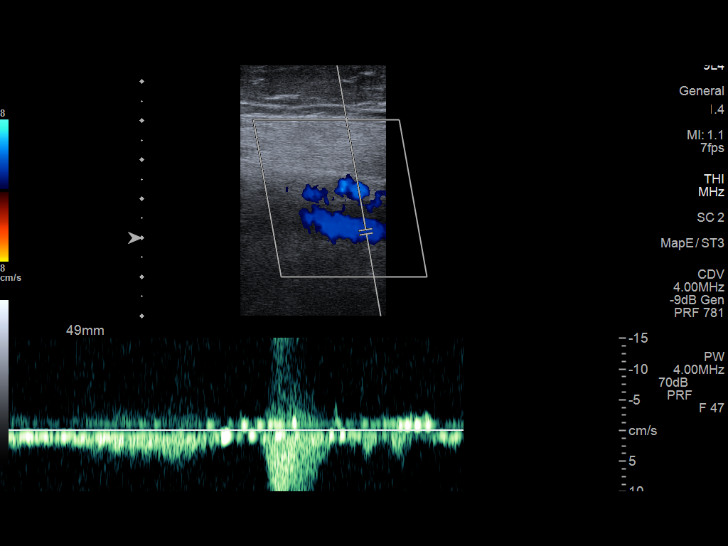
[im 47/57]
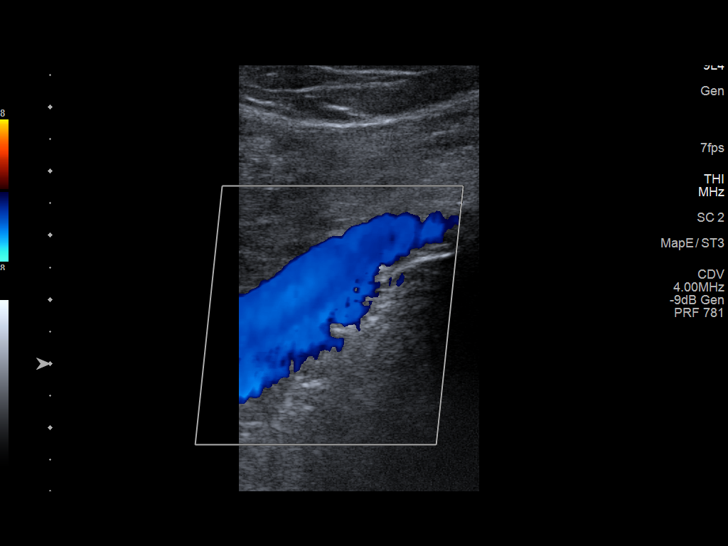
[im 52/57]
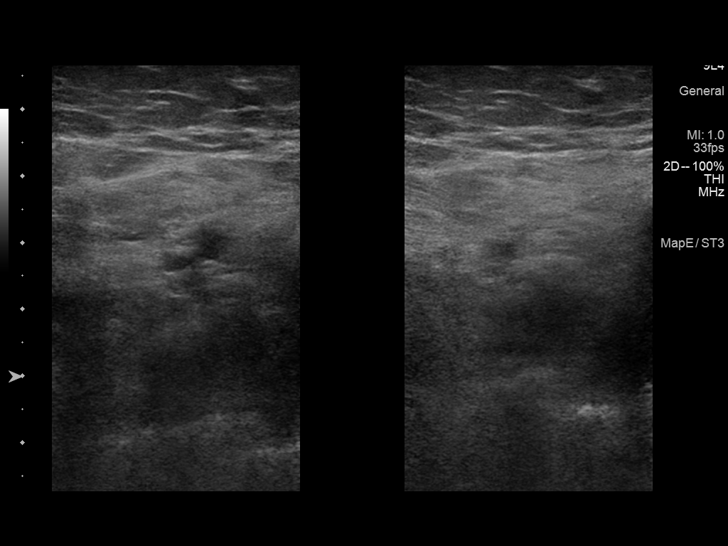
[im 57/57]
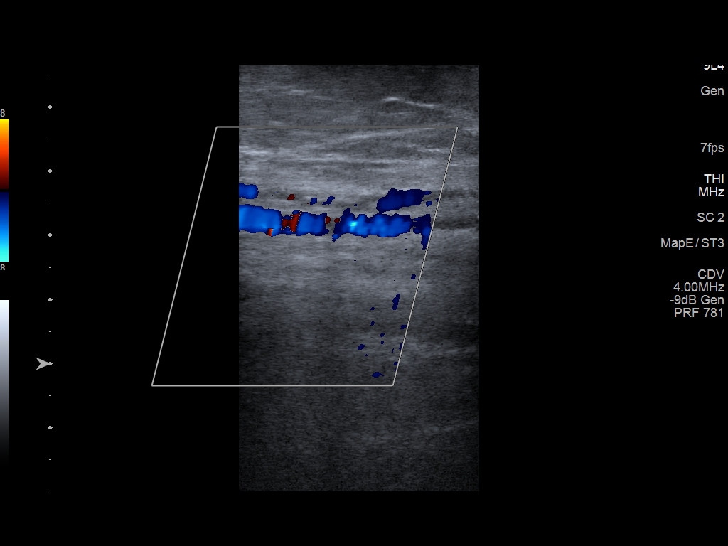

[13 of 24 positions shown; findings below may reference images not displayed]

FINDINGS: RIGHT LOWER EXTREMITY

Common Femoral Vein: No evidence of thrombus. Normal
compressibility, respiratory phasicity and response to augmentation.

Saphenofemoral Junction: No evidence of thrombus. Normal
compressibility and flow on color Doppler imaging.

Profunda Femoral Vein: No evidence of thrombus. Normal
compressibility and flow on color Doppler imaging.

Femoral Vein: No evidence of thrombus. Normal compressibility,
respiratory phasicity and response to augmentation.

Popliteal Vein: No evidence of thrombus. Normal compressibility,
respiratory phasicity and response to augmentation.

Calf Veins: No evidence of thrombus. Normal compressibility and flow
on color Doppler imaging.

Superficial Great Saphenous Vein: No evidence of thrombus. Normal
compressibility.

Venous Reflux:  None.

Other Findings:  None.

LEFT LOWER EXTREMITY

Common Femoral Vein: No evidence of thrombus. Normal
compressibility, respiratory phasicity and response to augmentation.

Saphenofemoral Junction: No evidence of thrombus. Normal
compressibility and flow on color Doppler imaging.

Profunda Femoral Vein: No evidence of thrombus. Normal
compressibility and flow on color Doppler imaging.

Femoral Vein: No evidence of thrombus. Normal compressibility,
respiratory phasicity and response to augmentation.

Popliteal Vein: No evidence of thrombus. Normal compressibility,
respiratory phasicity and response to augmentation.

Calf Veins: No evidence of thrombus. Normal compressibility and flow
on color Doppler imaging.

Superficial Great Saphenous Vein: No evidence of thrombus. Normal
compressibility.

Venous Reflux:  None.

Other Findings:  None.
IMPRESSION: No evidence of DVT within either lower extremity.

## 2021-08-17 DIAGNOSIS — H5212 Myopia, left eye: Secondary | ICD-10-CM | POA: Diagnosis not present

## 2021-08-17 DIAGNOSIS — H52223 Regular astigmatism, bilateral: Secondary | ICD-10-CM | POA: Diagnosis not present

## 2021-08-17 DIAGNOSIS — H5201 Hypermetropia, right eye: Secondary | ICD-10-CM | POA: Diagnosis not present

## 2021-08-19 DIAGNOSIS — E7801 Familial hypercholesterolemia: Secondary | ICD-10-CM | POA: Diagnosis not present

## 2021-08-19 DIAGNOSIS — R350 Frequency of micturition: Secondary | ICD-10-CM | POA: Diagnosis not present

## 2021-08-19 DIAGNOSIS — E559 Vitamin D deficiency, unspecified: Secondary | ICD-10-CM | POA: Diagnosis not present

## 2021-08-19 DIAGNOSIS — Z0000 Encounter for general adult medical examination without abnormal findings: Secondary | ICD-10-CM | POA: Diagnosis not present

## 2021-08-26 ENCOUNTER — Telehealth: Payer: Self-pay | Admitting: Hematology and Oncology

## 2021-08-26 NOTE — Telephone Encounter (Signed)
Scheduled appt per 7/7 referral. Pt is already established with Dr. Lindi Adie. Called pt, no answer. Left msg with appt date/time.

## 2021-09-01 ENCOUNTER — Encounter (HOSPITAL_COMMUNITY): Payer: Self-pay

## 2021-09-01 DIAGNOSIS — Z0000 Encounter for general adult medical examination without abnormal findings: Secondary | ICD-10-CM | POA: Diagnosis not present

## 2021-09-01 DIAGNOSIS — I10 Essential (primary) hypertension: Secondary | ICD-10-CM | POA: Diagnosis not present

## 2021-09-01 DIAGNOSIS — Z01419 Encounter for gynecological examination (general) (routine) without abnormal findings: Secondary | ICD-10-CM | POA: Diagnosis not present

## 2021-09-01 DIAGNOSIS — E559 Vitamin D deficiency, unspecified: Secondary | ICD-10-CM | POA: Diagnosis not present

## 2021-09-01 DIAGNOSIS — I2510 Atherosclerotic heart disease of native coronary artery without angina pectoris: Secondary | ICD-10-CM | POA: Diagnosis not present

## 2021-09-01 DIAGNOSIS — N1831 Chronic kidney disease, stage 3a: Secondary | ICD-10-CM | POA: Diagnosis not present

## 2021-09-01 DIAGNOSIS — D72819 Decreased white blood cell count, unspecified: Secondary | ICD-10-CM | POA: Diagnosis not present

## 2021-09-01 DIAGNOSIS — E7800 Pure hypercholesterolemia, unspecified: Secondary | ICD-10-CM | POA: Diagnosis not present

## 2021-09-05 ENCOUNTER — Inpatient Hospital Stay: Payer: Medicare PPO | Attending: Hematology and Oncology | Admitting: Hematology and Oncology

## 2021-09-05 NOTE — Assessment & Plan Note (Deleted)
Leukopenia dating back to at least 3 years 7 years ago the white count was 4.5 2017: WBC 3.1, hemoglobin 12.5 2019: WBC 2.9, hemoglobin 11.8 10/31/2018: WBC 2.7, hemoglobin 11 MCV 87 05/28/2019: WBC 3.3, ANC 1.6, hemoglobin 11  02/22/2021: WBC: 3.2, ANC: 1.4, Hb 11.9  Work-up performed previously:  A98 614, folic acid 83.0 ANA negative CMP revealed a creatinine 1.5 Reticulocyte count 1.2%

## 2021-09-07 ENCOUNTER — Ambulatory Visit (HOSPITAL_COMMUNITY): Payer: Medicare PPO

## 2021-09-18 DIAGNOSIS — Z1212 Encounter for screening for malignant neoplasm of rectum: Secondary | ICD-10-CM | POA: Diagnosis not present

## 2021-09-18 DIAGNOSIS — Z1211 Encounter for screening for malignant neoplasm of colon: Secondary | ICD-10-CM | POA: Diagnosis not present

## 2021-09-19 ENCOUNTER — Other Ambulatory Visit: Payer: Self-pay

## 2021-09-19 MED ORDER — VALSARTAN 80 MG PO TABS: 80.0000 mg | ORAL_TABLET | Freq: Every day | ORAL | 2 refills | Status: DC

## 2021-09-19 MED ORDER — ATORVASTATIN CALCIUM 40 MG PO TABS: 40.0000 mg | ORAL_TABLET | Freq: Every day | ORAL | 2 refills | Status: DC

## 2021-09-19 NOTE — Addendum Note (Signed)
Addended by: Carter Kitten D on: 09/19/2021 11:45 AM   Modules accepted: Orders

## 2021-09-23 ENCOUNTER — Telehealth: Payer: Self-pay | Admitting: Hematology and Oncology

## 2021-09-23 NOTE — Telephone Encounter (Signed)
Scheduled per 8/4 in basket, pt has been called and confirmed

## 2021-09-26 LAB — COLOGUARD: COLOGUARD: NEGATIVE

## 2021-09-26 LAB — EXTERNAL GENERIC LAB PROCEDURE: COLOGUARD: NEGATIVE

## 2021-10-03 ENCOUNTER — Other Ambulatory Visit: Payer: Self-pay

## 2021-10-03 ENCOUNTER — Inpatient Hospital Stay: Payer: Medicare PPO

## 2021-10-03 ENCOUNTER — Inpatient Hospital Stay: Payer: Medicare PPO | Attending: Hematology and Oncology | Admitting: Hematology and Oncology

## 2021-10-03 DIAGNOSIS — D709 Neutropenia, unspecified: Secondary | ICD-10-CM | POA: Diagnosis not present

## 2021-10-03 LAB — CBC WITH DIFFERENTIAL (CANCER CENTER ONLY)
Abs Immature Granulocytes: 0 10*3/uL (ref 0.00–0.07)
Basophils Absolute: 0 10*3/uL (ref 0.0–0.1)
Basophils Relative: 1 %
Eosinophils Absolute: 0.1 10*3/uL (ref 0.0–0.5)
Eosinophils Relative: 2 %
HCT: 33.8 % — ABNORMAL LOW (ref 36.0–46.0)
Hemoglobin: 11.9 g/dL — ABNORMAL LOW (ref 12.0–15.0)
Immature Granulocytes: 0 %
Lymphocytes Relative: 43 %
Lymphs Abs: 1.2 10*3/uL (ref 0.7–4.0)
MCH: 30.3 pg (ref 26.0–34.0)
MCHC: 35.2 g/dL (ref 30.0–36.0)
MCV: 86 fL (ref 80.0–100.0)
Monocytes Absolute: 0.3 10*3/uL (ref 0.1–1.0)
Monocytes Relative: 10 %
Neutro Abs: 1.3 10*3/uL — ABNORMAL LOW (ref 1.7–7.7)
Neutrophils Relative %: 44 %
Platelet Count: 186 10*3/uL (ref 150–400)
RBC: 3.93 MIL/uL (ref 3.87–5.11)
RDW: 15 % (ref 11.5–15.5)
WBC Count: 2.8 10*3/uL — ABNORMAL LOW (ref 4.0–10.5)
nRBC: 0 % (ref 0.0–0.2)

## 2021-10-03 LAB — VITAMIN B12: Vitamin B-12: 357 pg/mL (ref 180–914)

## 2021-10-03 NOTE — Progress Notes (Signed)
Patient Care Team: [name], MD as PCP - General (Internal Medicine) [name], MD as PCP - Cardiology (Cardiology)  DIAGNOSIS:  Encounter Diagnosis  Name Primary?   Neutropenia, unspecified type ([place])       CHIEF COMPLIANT: Follow-up of neutropenia  INTERVAL HISTORY: [name] is a 76 year old lady with above-mentioned history of neutropenia who is here for routine follow-up.  She reports no fevers or chills or infections.   ALLERGIES:  is allergic to fenoprofen calcium.  MEDICATIONS:  Current Outpatient Medications  Medication Sig Dispense Refill   atorvastatin (LIPITOR) 40 MG tablet Take 1 tablet (40 mg total) by mouth daily. 90 tablet 2   clopidogrel (PLAVIX) 75 MG tablet Take 1 tablet (75 mg total) by mouth daily. 90 tablet 3   furosemide (LASIX) 20 MG tablet Take 1 tablet (20 mg total) by mouth daily. 90 tablet 3   pantoprazole (PROTONIX) 40 MG tablet Take 40 mg by mouth daily.     valsartan (DIOVAN) 80 MG tablet Take 1 tablet (80 mg total) by mouth daily. 90 tablet 2   Vitamin D, Ergocalciferol, (DRISDOL) 1.25 MG (50000 UNIT) CAPS capsule Take 50,000 Units by mouth once a week.     No current facility-administered medications for this visit.    PHYSICAL EXAMINATION: ECOG PERFORMANCE STATUS: 0 - Asymptomatic  Vitals:   10/03/21 1148  BP: (!) 164/65  Pulse: (!) 49  Resp: 17  Temp: (!) 97.2 F (36.2 C)  SpO2: 100%   Filed Weights   10/03/21 1148  Weight: 207 lb 8 oz (94.1 kg)     LABORATORY DATA:  I have reviewed the data as listed    Latest Ref Rng & Units 07/07/2020   11:39 AM 11/25/2018    1:29 PM 10/31/2018    9:06 AM  CMP  Glucose 65 - 99 mg/dL 123  128  92   BUN 8 - 27 mg/dL '14  23  20   '$ Creatinine 0.57 - 1.00 mg/dL 1.09  1.52  1.37   Sodium 134 - 144 mmol/L 142  141  140   Potassium 3.5 - 5.2 mmol/L 3.8  3.8  4.2   Chloride 96 - 106 mmol/L 105  104  101   CO2 20 - 29 mmol/L '24  30  24   '$ Calcium 8.7 - 10.3 mg/dL 9.8  9.2  9.4    Total Protein 6.5 - 8.1 g/dL  7.5  6.9   Total Bilirubin 0.3 - 1.2 mg/dL  0.8  0.8   Alkaline Phos 38 - 126 U/L  99  95   AST 15 - 41 U/L  23  28   ALT 0 - 44 U/L  17  14     Lab Results  Component Value Date   WBC 2.8 (L) 10/03/2021   HGB 11.9 (L) 10/03/2021   HCT 33.8 (L) 10/03/2021   MCV 86.0 10/03/2021   PLT 186 10/03/2021   NEUTROABS 1.3 (L) 10/03/2021    ASSESSMENT & PLAN:  NEUTROPENIA UNSPECIFIED Leukopenia dating back to at least 3 years 7 years ago the white count was 4.5 2017: WBC 3.1, hemoglobin 12.5 2019: WBC 2.9, hemoglobin 11.8 10/31/2018: WBC 2.7, hemoglobin 11 MCV 87 05/28/2019: WBC 3.3, ANC 1.6, hemoglobin 11 08/05/2020: WBC 2.7 02/22/2021: WBC 3.2 08/19/2021: WBC 2.2 10/03/2021: WBC 2.8   Work-up performed previously:  K16 010, folic acid 93.2 ANA negative CMP revealed a creatinine 1.5 Reticulocyte count 1.2%  Cause: Medication versus Cyclical versus  ethnicity related Continue to watch and monitor Return to clinic in 1 year for follow-up with labs   Orders Placed This Encounter  Procedures   CBC with Differential ([facility] Only)    Standing Status:   Future    Number of Occurrences:   1    Standing Expiration Date:   10/04/2022   Vitamin B12    Standing Status:   Future    Number of Occurrences:   1    Standing Expiration Date:   10/04/2022   CBC with Differential ([facility] Only)    Standing Status:   Future    Standing Expiration Date:   10/04/2022   The patient has a good understanding of the overall plan. she agrees with it. she will call with any problems that may develop before the next visit here. Total time spent: 30 mins including face to face time and time spent for planning, charting and co-ordination of care   Harriette Ohara, MD 10/03/21

## 2021-10-03 NOTE — Assessment & Plan Note (Signed)
Leukopenia dating back to at least 3 years 7 years ago the white count was 4.5 2017: WBC 3.1, hemoglobin 12.5 2019: WBC 2.9, hemoglobin 11.8 10/31/2018: WBC 2.7, hemoglobin 11 MCV 87 05/28/2019: WBC 3.3, ANC 1.6, hemoglobin 11  Work-up performed previously:  B34 037, folic acid 09.6 ANA negative CMP revealed a creatinine 1.5 Reticulocyte count 1.2%  Cause: Medication versus ethnicity related Continue to watch and monitor Return to clinic in 1 year for follow-up with labs

## 2021-11-03 NOTE — Progress Notes (Unsigned)
Cardiology Office Note:    Date:  11/03/2021   ID:  [name], [name] 01-14-1946, MRN [id number]  PCP:  [name], MD   [facility] Providers Cardiologist:  [name], MD {   Referring MD: [name], MD    History of Present Illness:    [name] is a 76 y.o. 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 Meda Coffee who now returns to clinic for follow-up.  Per review of the record, the patient has history of myoview in 2017 for SOB which was negative for infarct or ischemia with normal LVEF. Also had Holter in 2017 palpitations that showed frequent PVCs, bigminy and sinus brady. Repeat holter in 2018 with significant improvement. During visit with Dr. Meda Coffee on 08/2019, she was experiencing worsening LE edema. TTE showed LVEF of 70%, global longitudinal strain -23%, aortic insufficiency mild to moderate and mild mitral regurgitation. Her lasix was increased at that visit to '40mg'$  daily with improvement.  Called clinic on 07/06/20 where she had an episode of chest pain while using the bathroom that radiated to her left arm. Symptoms lasted about 60mn. Myoview 07/2020 with no ischemia or infarction. TTE 08/2020 with LVEF 58%, G2DD, mild-to-moderate MR, mild-to-moderate TR, mild-to-moderate AR, aortic sclerosis.   Last seen in clinic on 05/11/21 where she was doing well. Chest pain was improved with tums.   Today, ***  Past Medical History:  Diagnosis Date   Aortic insufficiency    mild to moderate by echo 08/2020   Carotid artery dissection (HCC)    a. remote LICA dissection.   CKD (chronic kidney disease), stage III (HCC)    Coronary artery disease    a. remote stents to LAD and large diagonal branch. b. PTCA of diag for ISR 2007. c. PTCA/DES to diagonal and PTCA of mid LAD 01/2007.    Fibromuscular dysplasia (HCC)    GERD (gastroesophageal reflux disease)    Headache(784.0)    hx of   Heart murmur    Hyperlipidemia    Hypertension    Internal hemorrhoids without mention of complication    Lower extremity edema    Lower extremity edema    Mitral regurgitation    mild to moderate by echo 08/2020   Neutropenia, unspecified (HCC)    Obesity    Premature atrial contractions    PVC's (premature ventricular contractions)    Sinus brady-tachy syndrome (HCC)    Stroke (HCC)    Tricuspid regurgitation    mild to moderate by echo 08/2020    Past Surgical History:  Procedure Laterality Date   ABDOMINAL HYSTERECTOMY     CARDIAC CATHETERIZATION  2008   Cardiac Stents  2008   COLONOSCOPY  2010   normal    gallstones removed     PARS PLANA VITRECTOMY  06/26/2011   Procedure: PARS PLANA VITRECTOMY WITH 23 GAUGE;  Surgeon: GAdonis Brook MD;  Location: [facility]  Service: Ophthalmology;  Laterality: Left;    Current Medications: No outpatient medications have been marked as taking for the 11/14/21 encounter (Appointment) with PFreada Bergeron MD.     Allergies:   Fenoprofen calcium   Social History   Socioeconomic History   Marital status: Widowed    Spouse name: AArnell Sieving  Number of children: 1   Years of education: 20   Highest  education level: Not on file  Occupational History   Occupation: Retired  Tobacco Use   Smoking status: Never   Smokeless tobacco: Never  Vaping Use   Vaping Use: Never used  Substance and Sexual Activity   Alcohol use: No   Drug use: No   Sexual activity: Yes    Birth control/protection: Surgical  Other Topics Concern   Not on file  Social History Narrative   Lives with husband   Caffeine use: Tea rare   Social Determinants of Health   Financial Resource Strain: Not on file  Food Insecurity: Not on file  Transportation Needs: Not on file  Physical Activity: Not on file  Stress: Not on file  Social Connections: Not on file      Family History: The patient's family history includes Breast cancer in her sister; Colon cancer in her mother; Heart attack in her father; Heart disease in her mother. There is no history of Anesthesia problems, Hypotension, Malignant hyperthermia, Pseudochol deficiency, Esophageal cancer, Rectal cancer, or Stomach cancer.  ROS:   Review of Systems  Constitutional:  Negative for chills, diaphoresis and fever.  HENT:  Positive for sore throat (Dry mouth). Negative for congestion.   Eyes:  Negative for blurred vision.  Respiratory:  Positive for shortness of breath. Negative for wheezing.   Cardiovascular:  Positive for leg swelling (Bilateral). Negative for palpitations, orthopnea, claudication and PND.  Gastrointestinal:  Positive for constipation and heartburn. Negative for blood in stool, nausea and vomiting.  Genitourinary:  Negative for dysuria, hematuria and urgency.  Skin:  Negative for itching and rash.  Neurological:  Positive for dizziness ("off-balance"). Negative for headaches.  Endo/Heme/Allergies:  Does not bruise/bleed easily.  Psychiatric/Behavioral:  The patient is not nervous/anxious and does not have insomnia.     EKGs/Labs/Other Studies Reviewed:    The following studies were reviewed today: TTE Sep 18, 2020: IMPRESSIONS   1. Left ventricular ejection fraction by 3D volume is 58 %. The left ventricle has normal function. The left ventricle has no regional wall motion abnormalities. There is mild left ventricular hypertrophy. Left ventricular diastolic parameters are consistent with Grade II diastolic dysfunction (pseudonormalization).   2. Right ventricular systolic function is normal. The right ventricular size is normal. There is normal pulmonary artery systolic pressure. The estimated right ventricular systolic pressure is 93.7 mmHg.   3. The mitral valve is grossly normal. Mild to moderate mitral valve regurgitation. No evidence of mitral stenosis.   4. Tricuspid  valve regurgitation is mild to moderate.   5. The aortic valve is grossly normal. Aortic valve regurgitation is mild to moderate. Mild aortic valve sclerosis is present, with no evidence of aortic valve stenosis. Aortic valve mean gradient measures 8.5 mmHg.   6. The inferior vena cava is normal in size with greater than 50% respiratory variability, suggesting right atrial pressure of 3 mmHg.   Myoview 07/2020: The left ventricular ejection fraction is hyperdynamic (>65%). Nuclear stress EF: 71%. There was no ST segment deviation noted during stress. The study is normal. This is a low risk study. No ischemia or infarction on perfusion images. Normal wall motion.   TTE 09/15/19: IMPRESSIONS   1. Global longitudinal strain is -23.1%. Left ventricular ejection fraction, by estimation, is 65 to 70%. The left ventricle has normal function. The left ventricle has no regional wall motion abnormalities. Left ventricular diastolic parameters were normal.   2. Right ventricular systolic function is normal. The right ventricular size is normal. There is normal pulmonary artery  systolic pressure.   3. There are 2 prominent jets of MR. Mild mitral valve regurgitation.   4. Compared to report from previous echo, aortic valve insufficiency is more prominent. No images to compare. The aortic valve is tricuspid. Aortic valve regurgitation is mild. Mild to moderate aortic valve sclerosis/calcification is present, without any evidence of aortic stenosis.   Comparison(s): 08/13/15 EF 60-65%.  Holter 2018: Sinus bradycardia to sinus rhythm. No AV blocks or pauses > 2 seconds. Frequent PACs, infrequent PVCs.   No AV blocks or pauses > 2 seconds. No diary provided.  No reason for cardiac source of dizziness was identified.   Myoview 2017: Nuclear stress EF: 71%. There was 25m of horizontal ST segment depression in the lateral precordial leads that became downsloping in recovery. Patient had submaximal  exercise treadmill test and was change to a Lexiscan. The nuclear images are normal. This is a low risk study. The left ventricular ejection fraction is hyperdynamic (>65%).  Holter 2017: Sinus bradycardia to sinus rhythm. Frequent PVCs and bigeminy, few triplets. Infrequent PACs.  EKG:  EKG was not ordered today 07/07/20-Sinus Bradycardia, rate: 85 bpm    Recent Labs: 10/03/2021: Hemoglobin 11.9; Platelet Count 186  Recent Lipid Panel    Component Value Date/Time   CHOL 129 10/31/2018 0906   TRIG 59 10/31/2018 0906   HDL 55 10/31/2018 0906   CHOLHDL 2.3 10/31/2018 0906   CHOLHDL 2.3 07/07/2015 0913   VLDL 13 07/07/2015 0913   LDLCALC 61 10/31/2018 0906     Physical Exam:    VS:  There were no vitals taken for this visit.    Wt Readings from Last 3 Encounters:  10/03/21 207 lb 8 oz (94.1 kg)  05/11/21 213 lb (96.6 kg)  11/11/20 213 lb (96.6 kg)     GEN: Well nourished, well developed in no acute distress HEENT: Normal NECK: No JVD; No carotid bruits LYMPHATICS: No lymphadenopathy CARDIAC: RRR, 2/6 systolic murmur, no rubs or  gallops RESPIRATORY:  Clear to auscultation without rales, wheezing or rhonchi  ABDOMEN: Soft, non-tender, non-distended MUSCULOSKELETAL: +1 pitting edema; No deformity  SKIN: Warm and dry NEUROLOGIC:  Alert and oriented x 3 PSYCHIATRIC:  Normal affect   ASSESSMENT:    No diagnosis found.   PLAN:    In order of problems listed above:  #Known CAD s/p PCI to LAD and D2 in 2008: Most recent myoview 07/2020 without ischemia. TTE with normal BiV function. No current anginal symptoms.  -Continue lipitor '40mg'$  daily -Continue plavix '75mg'$  daily  #FMD with history of carotid dissection: -Carotid ultasound 07/2020 with no significant stenosis -Continue atorvastatin '40mg'$  daily and plavix '75mg'$  daily as above  #LE Edema: Stable. -Continue lasix '20mg'$  daily -Compression socks and leg elevation  #HTN: Well controlled. -Continue valsartan  '80mg'$  daily  #HLD: -Continue lipitor '40mg'$  daily as above  #Mild-to-moderate AI: #Mild-to-moderate MR: #Mild-to-Moderate TR: Stable on TTE 08/2020. Will repeat 2024. -Continue serial monitoring TTE in 2024  Medication Adjustments/Labs and Tests Ordered: Current medicines are reviewed at length with the patient today.  Concerns regarding medicines are outlined above.  No orders of the defined types were placed in this encounter.  No orders of the defined types were placed in this encounter.   There are no Patient Instructions on file for this visit.   I,Mykaella Javier,acting as a scribe for HFreada Bergeron MD.,have documented all relevant documentation on the behalf of HFreada Bergeron MD,as directed by  HFreada Bergeron MD while in the  presence of [name], MD.  I, [name], MD, have reviewed all documentation for this visit. The documentation on 11/03/21 for the exam, diagnosis, procedures, and orders are all accurate and complete.   Signed, [name], MD  11/03/2021 8:15 PM    [facility]

## 2021-11-14 ENCOUNTER — Encounter: Payer: Self-pay | Admitting: Cardiology

## 2021-11-14 ENCOUNTER — Ambulatory Visit: Payer: Medicare PPO | Attending: Cardiology | Admitting: Cardiology

## 2021-11-14 VITALS — BP 134/86 | HR 49 | Ht 68.0 in | Wt 208.2 lb

## 2021-11-14 DIAGNOSIS — M79661 Pain in right lower leg: Secondary | ICD-10-CM | POA: Diagnosis not present

## 2021-11-14 DIAGNOSIS — M79662 Pain in left lower leg: Secondary | ICD-10-CM | POA: Diagnosis not present

## 2021-11-14 DIAGNOSIS — I7771 Dissection of carotid artery: Secondary | ICD-10-CM

## 2021-11-14 DIAGNOSIS — R600 Localized edema: Secondary | ICD-10-CM

## 2021-11-14 DIAGNOSIS — I351 Nonrheumatic aortic (valve) insufficiency: Secondary | ICD-10-CM

## 2021-11-14 DIAGNOSIS — I2510 Atherosclerotic heart disease of native coronary artery without angina pectoris: Secondary | ICD-10-CM | POA: Diagnosis not present

## 2021-11-14 DIAGNOSIS — E782 Mixed hyperlipidemia: Secondary | ICD-10-CM

## 2021-11-14 DIAGNOSIS — I340 Nonrheumatic mitral (valve) insufficiency: Secondary | ICD-10-CM

## 2021-11-14 MED ORDER — VALSARTAN 80 MG PO TABS: 80.0000 mg | ORAL_TABLET | Freq: Every day | ORAL | 3 refills | Status: DC

## 2021-11-14 MED ORDER — FUROSEMIDE 20 MG PO TABS: 20.0000 mg | ORAL_TABLET | Freq: Every day | ORAL | 4 refills | Status: DC | PRN

## 2021-11-14 MED ORDER — PANTOPRAZOLE SODIUM 40 MG PO TBEC: 40.0000 mg | DELAYED_RELEASE_TABLET | Freq: Every day | ORAL | 3 refills | Status: DC

## 2021-11-14 MED ORDER — ATORVASTATIN CALCIUM 40 MG PO TABS: 40.0000 mg | ORAL_TABLET | Freq: Every day | ORAL | 3 refills | Status: DC

## 2021-11-14 MED ORDER — CLOPIDOGREL BISULFATE 75 MG PO TABS: 75.0000 mg | ORAL_TABLET | Freq: Every day | ORAL | 3 refills | Status: DC

## 2021-11-14 NOTE — Progress Notes (Signed)
Cardiology Office Note:    Date:  11/14/2021   ID:  Mical, Brun 03-27-1945, MRN [id number]  PCP:  [name], MD   [facility] Providers Cardiologist:  [name], MD {   Referring MD: [name], MD    History of Present Illness:    [name] is a 76 y.o. 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 Meda Coffee who now returns to clinic for follow-up.  Per review of the record, the patient has history of myoview in 2017 for SOB which was negative for infarct or ischemia with normal LVEF. Also had Holter in 2017 palpitations that showed frequent PVCs, bigminy and sinus brady. Repeat holter in 2018 with significant improvement. During visit with Dr. Meda Coffee on 08/2019, she was experiencing worsening LE edema. TTE showed LVEF of 70%, global longitudinal strain -23%, aortic insufficiency mild to moderate and mild mitral regurgitation. Her lasix was increased at that visit to '40mg'$  daily with improvement.  Called clinic on 07/06/20 where she had an episode of chest pain while using the bathroom that radiated to her left arm. Symptoms lasted about 73mn. Myoview 07/2020 with no ischemia or infarction. TTE 08/2020 with LVEF 58%, G2DD, mild-to-moderate MR, mild-to-moderate TR, mild-to-moderate AR, aortic sclerosis.   Last seen in clinic on 05/11/21 where she was doing well. Chest pain was improved with tums.   Today, the patient has several issues that she would like to address. First, she has been having R>L lower extremity calf pain and tenderness to palpitation. Notes a knot in her right calf which is new. Has chronic LE edema which is stable on her lasix. Denies any significant dyspnea on exertion and patient is not tachycardic on exam today.   Also she complains of intermittent chest pain which  she describes as "chest discomfort." This occurs with associated shortness of breath when she is walking for a period of time. She states that the chest pain and the shortness of breath has not been getting worse, but is stable from prior visits. Myoview in 07/2020 that was obtained for similar symptoms was normal.  She is also concerned about weight loss and fatigue. Has lost about 6lbs over the past several months. She is planning on following up with her PCP for further management.   She denies any palpitations, lightheadedness, syncope, orthopnea, or PND.   Past Medical History:  Diagnosis Date   Aortic insufficiency    mild to moderate by echo 08/2020   Carotid artery dissection (HCC)    a. remote LICA dissection.   CKD (chronic kidney disease), stage III (HCC)    Coronary artery disease    a. remote stents to LAD and large diagonal branch. b. PTCA of diag for ISR 2007. c. PTCA/DES to diagonal and PTCA of mid LAD 01/2007.   Fibromuscular dysplasia (HCC)    GERD (gastroesophageal reflux disease)    Headache(784.0)    hx of   Heart murmur    Hyperlipidemia    Hypertension    Internal hemorrhoids without mention of complication    Lower extremity edema    Lower extremity edema    Mitral regurgitation    mild to moderate by echo 08/2020   Neutropenia, unspecified (HCC)    Obesity    Premature atrial contractions    PVC's (premature ventricular contractions)  Sinus brady-tachy syndrome (HCC)    Stroke [facility])    Tricuspid regurgitation    mild to moderate by echo 08/2020    Past Surgical History:  Procedure Laterality Date   ABDOMINAL HYSTERECTOMY     CARDIAC CATHETERIZATION  2008   Cardiac Stents  2008   COLONOSCOPY  2010   normal    gallstones removed     PARS PLANA VITRECTOMY  06/26/2011   Procedure: PARS PLANA VITRECTOMY WITH 23 GAUGE;  Surgeon: Adonis Brook, MD;  Location: [place];  Service: Ophthalmology;  Laterality: Left;    Current Medications: Current Meds   Medication Sig   Vitamin D, Ergocalciferol, (DRISDOL) 1.25 MG (50000 UNIT) CAPS capsule Take 50,000 Units by mouth once a week.   [DISCONTINUED] atorvastatin (LIPITOR) 40 MG tablet Take 1 tablet (40 mg total) by mouth daily.   [DISCONTINUED] clopidogrel (PLAVIX) 75 MG tablet Take 1 tablet (75 mg total) by mouth daily.   [DISCONTINUED] furosemide (LASIX) 20 MG tablet Take 1 tablet (20 mg total) by mouth daily. (Patient taking differently: Take 20 mg by mouth as needed for fluid or edema.)   [DISCONTINUED] pantoprazole (PROTONIX) 40 MG tablet Take 40 mg by mouth daily.   [DISCONTINUED] valsartan (DIOVAN) 80 MG tablet Take 1 tablet (80 mg total) by mouth daily.     Allergies:   Fenoprofen calcium   Social History   Socioeconomic History   Marital status: Widowed    Spouse name: [name]   Number of children: 1   Years of education: 20   Highest education level: Not on file  Occupational History   Occupation: Retired  Tobacco Use   Smoking status: Never   Smokeless tobacco: Never  Vaping Use   Vaping Use: Never used  Substance and Sexual Activity   Alcohol use: No   Drug use: No   Sexual activity: Yes    Birth control/protection: Surgical  Other Topics Concern   Not on file  Social History Narrative   Lives with husband   Caffeine use: Tea rare   Social Determinants of Health   Financial Resource Strain: Not on file  Food Insecurity: Not on file  Transportation Needs: Not on file  Physical Activity: Not on file  Stress: Not on file  Social Connections: Not on file     Family History: The patient's family history includes Breast cancer in her sister; Colon cancer in her mother; Heart attack in her father; Heart disease in her mother. There is no history of Anesthesia problems, Hypotension, Malignant hyperthermia, Pseudochol deficiency, Esophageal cancer, Rectal cancer, or Stomach cancer.  ROS:   Review of Systems  Constitutional:  Negative for chills, diaphoresis and  fever.  HENT:  Negative for congestion and sore throat.   Eyes:  Negative for blurred vision.  Respiratory:  Positive for shortness of breath. Negative for wheezing.   Cardiovascular:  Positive for chest pain and leg swelling (Bilateral R>L). Negative for palpitations, orthopnea, claudication and PND.  Gastrointestinal:  Negative for blood in stool, nausea and vomiting.  Genitourinary:  Negative for dysuria, hematuria and urgency.  Skin:  Negative for itching and rash.  Neurological:  Positive for headaches. Negative for dizziness.  Endo/Heme/Allergies:  Does not bruise/bleed easily.  Psychiatric/Behavioral:  The patient is not nervous/anxious and does not have insomnia.     EKGs/Labs/Other Studies Reviewed:    The following studies were reviewed today:  TTE 08/2020: IMPRESSIONS   1. Left ventricular ejection fraction by 3D volume is 58 %. The  left ventricle has normal function. The left ventricle has no regional wall motion abnormalities. There is mild left ventricular hypertrophy. Left ventricular diastolic parameters are consistent with Grade II diastolic dysfunction (pseudonormalization).   2. Right ventricular systolic function is normal. The right ventricular size is normal. There is normal pulmonary artery systolic pressure. The estimated right ventricular systolic pressure is 30.1 mmHg.   3. The mitral valve is grossly normal. Mild to moderate mitral valve regurgitation. No evidence of mitral stenosis.   4. Tricuspid valve regurgitation is mild to moderate.   5. The aortic valve is grossly normal. Aortic valve regurgitation is mild to moderate. Mild aortic valve sclerosis is present, with no evidence of aortic valve stenosis. Aortic valve mean gradient measures 8.5 mmHg.   6. The inferior vena cava is normal in size with greater than 50% respiratory variability, suggesting right atrial pressure of 3 mmHg.   Bilateral Carotid Doppler 07/29/2020: Summary:  Right Carotid: The  extracranial vessels were near-normal with only minimal  wall                 thickening or plaque.   Left Carotid: The extracranial vessels were near-normal with only minimal  wall                thickening or plaque.   Vertebrals:  Bilateral vertebral arteries demonstrate antegrade flow.  Subclavians: Normal flow hemodynamics were seen in bilateral subclavian               arteries.   Myoview 07/2020: The left ventricular ejection fraction is hyperdynamic (>65%). Nuclear stress EF: 71%. There was no ST segment deviation noted during stress. The study is normal. This is a low risk study. No ischemia or infarction on perfusion images. Normal wall motion.   TTE 09/15/19: IMPRESSIONS   1. Global longitudinal strain is -23.1%. Left ventricular ejection fraction, by estimation, is 65 to 70%. The left ventricle has normal function. The left ventricle has no regional wall motion abnormalities. Left ventricular diastolic parameters were normal.   2. Right ventricular systolic function is normal. The right ventricular size is normal. There is normal pulmonary artery systolic pressure.   3. There are 2 prominent jets of MR. Mild mitral valve regurgitation.   4. Compared to report from previous echo, aortic valve insufficiency is more prominent. No images to compare. The aortic valve is tricuspid. Aortic valve regurgitation is mild. Mild to moderate aortic valve sclerosis/calcification is present, without any evidence of aortic stenosis.   Comparison(s): 08/13/15 EF 60-65%.  Holter 2018: Sinus bradycardia to sinus rhythm. No AV blocks or pauses > 2 seconds. Frequent PACs, infrequent PVCs.   No AV blocks or pauses > 2 seconds. No diary provided.  No reason for cardiac source of dizziness was identified.   Myoview 2017: Nuclear stress EF: 71%. There was 47m of horizontal ST segment depression in the lateral precordial leads that became downsloping in recovery. Patient had submaximal  exercise treadmill test and was change to a Lexiscan. The nuclear images are normal. This is a low risk study. The left ventricular ejection fraction is hyperdynamic (>65%).  Holter 2017: Sinus bradycardia to sinus rhythm. Frequent PVCs and bigeminy, few triplets. Infrequent PACs.  EKG:  EKG is personally reviewed 11/14/2021: Sinus bradycardia. Rate 49 bpm. 07/07/20-Sinus Bradycardia, rate: 85 bpm    Recent Labs: 10/03/2021: Hemoglobin 11.9; Platelet Count 186  Recent Lipid Panel    Component Value Date/Time   CHOL 129 10/31/2018 0906   TRIG 59  10/31/2018 0906   HDL 55 10/31/2018 0906   CHOLHDL 2.3 10/31/2018 0906   CHOLHDL 2.3 07/07/2015 0913   VLDL 13 07/07/2015 0913   LDLCALC 61 10/31/2018 0906     Physical Exam:    When her legs were palpated, they were both tender. R>L VS:  BP 134/86   Pulse (!) 49   Ht '5\' 8"'$  (1.727 m)   Wt 208 lb 3.2 oz (94.4 kg)   SpO2 93%   BMI 31.66 kg/m     Wt Readings from Last 3 Encounters:  11/14/21 208 lb 3.2 oz (94.4 kg)  10/03/21 207 lb 8 oz (94.1 kg)  05/11/21 213 lb (96.6 kg)     GEN: Well nourished, well developed in no acute distress HEENT: Normal NECK: No JVD; No carotid bruits CARDIAC: RRR,  2/6 systolic murmur, no rubs or  gallops RESPIRATORY:  Clear to auscultation without rales, wheezing or rhonchi  ABDOMEN: Soft, non-tender, non-distended MUSCULOSKELETAL:  +1 pitting edema R>L with tenderness on palpation bilaterally but R>L; No deformity  SKIN: Warm and dry NEUROLOGIC:  Alert and oriented x 3 PSYCHIATRIC:  Normal affect   ASSESSMENT:    1. Bilateral calf pain   2. Bilateral lower extremity edema   3. Mixed hyperlipidemia   4. Moderate aortic regurgitation   5. Mild mitral valve regurgitation   6. Coronary artery disease involving native coronary artery of native heart without angina pectoris   7. Carotid artery dissection (HCC)     PLAN:    In order of problems listed above:  #Calf tenderness with  asymmertic LE edema: Patient has chronic LE edema that appears slightly worse in the RLE than the LLE on exam today. Also endorsing calf tenderness and has a palpable chord on exam. She is not dyspneic or tachycardic on exam but will check bilateral LE dopplers to ensure no evidence of DVT given symptoms. -Check LE doppler ultrasounds to assess for DVT -Continue lasix '20mg'$  prn  #Known CAD s/p PCI to LAD and D2 in 2008: #DOE: Most recent myoview 07/2020 without ischemia. TTE with normal BiV function. Continues to have mild shortness of breath and "chest discomfort" with prolonged exertion that is unchanged from prior visits. Given reassuring myoview and lack of progression of symptoms, will continue with medical management at this time. She will contact us if symptoms worsen. -Myoview 07/2020 with no evidence of ischemia or infarction with EF 71% -Continue lipitor '40mg'$  daily -Continue plavix '75mg'$  daily -Monitor for progression of symptoms  #FMD with history of carotid dissection: -Carotid ultasound 07/2020 with no significant stenosis -Continue atorvastatin '40mg'$  daily and plavix '75mg'$  daily as above  #HTN: Well controlled and at goal. -Continue valsartan '80mg'$  daily  #HLD: -Continue lipitor '40mg'$  daily as above -LDL at goal at 63 on 08/19/21  #Mild-to-moderate AI: #Mild-to-moderate MR: #Mild-to-Moderate TR: Stable on TTE 08/2020. Will repeat 2024. -Continue serial monitoring with TTE in 2024  Follow Up: 6 months  Medication Adjustments/Labs and Tests Ordered: Current medicines are reviewed at length with the patient today.  Concerns regarding medicines are outlined above.  Orders Placed This Encounter  Procedures   US Venous Img Lower Bilateral (DVT)   EKG 12-Lead   VAS [place] LOWER EXTREMITY VENOUS (DVT)   Meds ordered this encounter  Medications   atorvastatin (LIPITOR) 40 MG tablet    Sig: Take 1 tablet (40 mg total) by mouth daily.    Dispense:  90 tablet    Refill:  3    clopidogrel (  PLAVIX) 75 MG tablet    Sig: Take 1 tablet (75 mg total) by mouth daily.    Dispense:  90 tablet    Refill:  3   furosemide (LASIX) 20 MG tablet    Sig: Take 1 tablet (20 mg total) by mouth daily as needed for fluid or edema.    Dispense:  30 tablet    Refill:  4   pantoprazole (PROTONIX) 40 MG tablet    Sig: Take 1 tablet (40 mg total) by mouth daily.    Dispense:  90 tablet    Refill:  3   valsartan (DIOVAN) 80 MG tablet    Sig: Take 1 tablet (80 mg total) by mouth daily.    Dispense:  90 tablet    Refill:  3   Patient Instructions  Medication Instructions:   Your physician recommends that you continue on your current medications as directed. Please refer to the Current Medication list given to you today.  *If you need a refill on your cardiac medications before your next appointment, please call your pharmacy*   Testing/Procedures:  Your physician has requested that you have a lower extremity venous duplex. This test is an ultrasound of the veins in the legs or arms. It looks at venous blood flow that carries blood from the heart to the legs or arms. Allow one hour for a Lower Venous exam. Allow thirty minutes for an Upper Venous exam. There are no restrictions or special instructions.  THIS MUST BE SCHEDULED TO BE DONE TOMORROW 11/15/21 PER DR. Johney Frame   Follow-Up: At [facility], you and your health needs are our priority.  As part of our continuing mission to provide you with exceptional heart care, we have created designated Provider Care Teams.  These Care Teams include your primary Cardiologist (physician) and Advanced Practice Providers (APPs -  Physician Assistants and Nurse Practitioners) who all work together to provide you with the care you need, when you need it.  We recommend signing up for the patient portal called "MyChart".  Sign up information is provided on this After Visit Summary.  MyChart is used to connect with patients for Virtual  Visits (Telemedicine).  Patients are able to view lab/test results, encounter notes, upcoming appointments, etc.  Non-urgent messages can be sent to your provider as well.   To learn more about what you can do with MyChart, go to [email or web address].    Your next appointment:   6 month(s)  The format for your next appointment:   In Person  Provider:   [name], MD      Important Information About Sugar        I,Mathew Stumpf,acting as a scribe for [name], MD.,have documented all relevant documentation on the behalf of [name], MD,as directed by  [name], MD while in the presence of [name], MD.  I, [name], MD, have reviewed all documentation for this visit. The documentation on 11/14/21 for the exam, diagnosis, procedures, and orders are all accurate and complete.   Signed, [name], MD  11/14/2021 7:38 PM    [place]

## 2021-11-14 NOTE — Patient Instructions (Signed)
Medication Instructions:   Your physician recommends that you continue on your current medications as directed. Please refer to the Current Medication list given to you today.  *If you need a refill on your cardiac medications before your next appointment, please call your pharmacy*   Testing/Procedures:  Your physician has requested that you have a lower extremity venous duplex. This test is an ultrasound of the veins in the legs or arms. It looks at venous blood flow that carries blood from the heart to the legs or arms. Allow one hour for a Lower Venous exam. Allow thirty minutes for an Upper Venous exam. There are no restrictions or special instructions.  THIS MUST BE SCHEDULED TO BE DONE TOMORROW 11/15/21 PER DR. Johney Frame   Follow-Up: At [facility] you and your health needs are our priority.  As part of our continuing mission to provide you with exceptional heart care, we have created designated Provider Care Teams.  These Care Teams include your primary Cardiologist (physician) and Advanced Practice Providers (APPs -  Physician Assistants and Nurse Practitioners) who all work together to provide you with the care you need, when you need it.  We recommend signing up for the patient portal called "MyChart".  Sign up information is provided on this After Visit Summary.  MyChart is used to connect with patients for Virtual Visits (Telemedicine).  Patients are able to view lab/test results, encounter notes, upcoming appointments, etc.  Non-urgent messages can be sent to your provider as well.   To learn more about what you can do with MyChart, go to [email or web address].    Your next appointment:   6 month(s)  The format for your next appointment:   In Person  Provider:   [name], MD      Important Information About Sugar

## 2021-11-15 ENCOUNTER — Ambulatory Visit (HOSPITAL_COMMUNITY): Payer: Medicare PPO

## 2021-11-15 ENCOUNTER — Ambulatory Visit (HOSPITAL_COMMUNITY)
Admission: RE | Admit: 2021-11-15 | Discharge: 2021-11-15 | Disposition: A | Discharge status: Home / Self Care | Payer: Medicare PPO | Source: Ambulatory Visit | Attending: Cardiology | Admitting: Cardiology

## 2021-11-15 DIAGNOSIS — R600 Localized edema: Secondary | ICD-10-CM | POA: Diagnosis not present

## 2021-11-15 DIAGNOSIS — M79661 Pain in right lower leg: Secondary | ICD-10-CM | POA: Diagnosis not present

## 2021-11-15 DIAGNOSIS — M79662 Pain in left lower leg: Secondary | ICD-10-CM | POA: Insufficient documentation

## 2021-11-23 ENCOUNTER — Telehealth: Payer: Self-pay | Admitting: *Deleted

## 2021-11-23 MED ORDER — FUROSEMIDE 20 MG PO TABS: 20.0000 mg | ORAL_TABLET | ORAL | 2 refills | Status: DC

## 2021-11-23 NOTE — Telephone Encounter (Signed)
[name], MD  11/15/2021  1:10 PM EDT     Her lower extremity dopplers look great with no evidence of blood clot! This is great news. I think she needs to increase her lasix to 3x/week as likely some of her leg discomfort is from the fluid and will hopefully improve with the lasix.     The patient has been notified of the result and verbalized understanding.  All questions (if any) were answered.  Pt aware to increase her lasix to 20 mg po daily 3 times weekly for lower extremity swelling. Confirmed the pharmacy of choice.   She has enough on hand at this time and will call the pharmacy when she needs a refill.  Pt verbalized understanding and agrees with this plan.

## 2022-01-05 DIAGNOSIS — I129 Hypertensive chronic kidney disease with stage 1 through stage 4 chronic kidney disease, or unspecified chronic kidney disease: Secondary | ICD-10-CM | POA: Diagnosis not present

## 2022-01-05 DIAGNOSIS — M7989 Other specified soft tissue disorders: Secondary | ICD-10-CM | POA: Diagnosis not present

## 2022-01-05 DIAGNOSIS — E785 Hyperlipidemia, unspecified: Secondary | ICD-10-CM | POA: Diagnosis not present

## 2022-01-05 DIAGNOSIS — I773 Arterial fibromuscular dysplasia: Secondary | ICD-10-CM | POA: Diagnosis not present

## 2022-01-05 DIAGNOSIS — N1831 Chronic kidney disease, stage 3a: Secondary | ICD-10-CM | POA: Diagnosis not present

## 2022-01-06 ENCOUNTER — Other Ambulatory Visit: Payer: Self-pay | Admitting: Nephrology

## 2022-01-06 DIAGNOSIS — M7989 Other specified soft tissue disorders: Secondary | ICD-10-CM

## 2022-01-06 DIAGNOSIS — I773 Arterial fibromuscular dysplasia: Secondary | ICD-10-CM

## 2022-01-19 ENCOUNTER — Ambulatory Visit
Admission: RE | Admit: 2022-01-19 | Discharge: 2022-01-19 | Disposition: A | Discharge status: Home / Self Care | Payer: Medicare PPO | Source: Ambulatory Visit | Attending: Nephrology | Admitting: Nephrology

## 2022-01-19 DIAGNOSIS — R238 Other skin changes: Secondary | ICD-10-CM | POA: Diagnosis not present

## 2022-01-19 DIAGNOSIS — M7989 Other specified soft tissue disorders: Secondary | ICD-10-CM

## 2022-01-19 DIAGNOSIS — I773 Arterial fibromuscular dysplasia: Secondary | ICD-10-CM

## 2022-01-23 DIAGNOSIS — N1831 Chronic kidney disease, stage 3a: Secondary | ICD-10-CM | POA: Diagnosis not present

## 2022-03-02 DIAGNOSIS — E7800 Pure hypercholesterolemia, unspecified: Secondary | ICD-10-CM | POA: Diagnosis not present

## 2022-03-09 DIAGNOSIS — E7800 Pure hypercholesterolemia, unspecified: Secondary | ICD-10-CM | POA: Diagnosis not present

## 2022-03-09 DIAGNOSIS — D72819 Decreased white blood cell count, unspecified: Secondary | ICD-10-CM | POA: Diagnosis not present

## 2022-03-09 DIAGNOSIS — I10 Essential (primary) hypertension: Secondary | ICD-10-CM | POA: Diagnosis not present

## 2022-03-09 DIAGNOSIS — E559 Vitamin D deficiency, unspecified: Secondary | ICD-10-CM | POA: Diagnosis not present

## 2022-03-09 DIAGNOSIS — N183 Chronic kidney disease, stage 3 unspecified: Secondary | ICD-10-CM | POA: Diagnosis not present

## 2022-03-09 DIAGNOSIS — K625 Hemorrhage of anus and rectum: Secondary | ICD-10-CM | POA: Diagnosis not present

## 2022-03-09 DIAGNOSIS — Z23 Encounter for immunization: Secondary | ICD-10-CM | POA: Diagnosis not present

## 2022-03-09 DIAGNOSIS — I2510 Atherosclerotic heart disease of native coronary artery without angina pectoris: Secondary | ICD-10-CM | POA: Diagnosis not present

## 2022-03-09 DIAGNOSIS — K219 Gastro-esophageal reflux disease without esophagitis: Secondary | ICD-10-CM | POA: Diagnosis not present

## 2022-03-20 DIAGNOSIS — K219 Gastro-esophageal reflux disease without esophagitis: Secondary | ICD-10-CM | POA: Diagnosis not present

## 2022-03-20 DIAGNOSIS — R438 Other disturbances of smell and taste: Secondary | ICD-10-CM | POA: Diagnosis not present

## 2022-03-20 DIAGNOSIS — K625 Hemorrhage of anus and rectum: Secondary | ICD-10-CM | POA: Diagnosis not present

## 2022-04-18 DIAGNOSIS — H35371 Puckering of macula, right eye: Secondary | ICD-10-CM | POA: Diagnosis not present

## 2022-04-18 DIAGNOSIS — H35342 Macular cyst, hole, or pseudohole, left eye: Secondary | ICD-10-CM | POA: Diagnosis not present

## 2022-04-18 DIAGNOSIS — Z961 Presence of intraocular lens: Secondary | ICD-10-CM | POA: Diagnosis not present

## 2022-05-02 DIAGNOSIS — Z1231 Encounter for screening mammogram for malignant neoplasm of breast: Secondary | ICD-10-CM | POA: Diagnosis not present

## 2022-05-06 NOTE — Progress Notes (Unsigned)
Cardiology Office Note:    Date:  05/10/2022   ID:  [name], [name] 05/21/1945, MRN [id number]  PCP:  [name], MD   [facility] Providers Cardiologist:  [name], MD {   Referring MD: [name], MD    History of Present Illness:    [name] is a 77 y.o. 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 Meda Coffee who now returns to clinic for follow-up.  Per review of the record, the patient has history of myoview in 2017 for SOB which was negative for infarct or ischemia with normal LVEF. Also had Holter in 2017 palpitations that showed frequent PVCs, bigminy and sinus brady. Repeat holter in 2018 with significant improvement. During visit with Dr. Meda Coffee on 08/2019, she was experiencing worsening LE edema. TTE showed LVEF of 70%, global longitudinal strain -23%, aortic insufficiency mild to moderate and mild mitral regurgitation. Her lasix was increased at that visit to 40mg  daily with improvement.  Called clinic on 07/06/20 where she had an episode of chest pain while using the bathroom that radiated to her left arm. Symptoms lasted about 57min. Myoview 07/2020 with no ischemia or infarction. TTE 08/2020 with LVEF 58%, G2DD, mild-to-moderate MR, mild-to-moderate TR, mild-to-moderate AR, aortic sclerosis.   Last seen in clinic on 10/2021 where she continued to have intermittent DOE and chest pain. Was also having calf tenderness and asymmetric swelling. LE doppler negative for DVT.   Today, the patient overall feels well. Has occasional pain in her right chest that is not exertional in nature. Symptoms improve with tums. Otherwise, she is doing well. No significant SOB at rest but has chronic dyspnea on exertion. LE edema is improved.     Past Medical History:  Diagnosis Date   Aortic  insufficiency    mild to moderate by echo 08/2020   Carotid artery dissection (HCC)    a. remote LICA dissection.   CKD (chronic kidney disease), stage III (HCC)    Coronary artery disease    a. remote stents to LAD and large diagonal branch. b. PTCA of diag for ISR 2007. c. PTCA/DES to diagonal and PTCA of mid LAD 01/2007.   Fibromuscular dysplasia (HCC)    GERD (gastroesophageal reflux disease)    Headache(784.0)    hx of   Heart murmur    Hyperlipidemia    Hypertension    Internal hemorrhoids without mention of complication    Lower extremity edema    Lower extremity edema    Mitral regurgitation    mild to moderate by echo 08/2020   Neutropenia, unspecified (HCC)    Obesity    Premature atrial contractions    PVC's (premature ventricular contractions)    Sinus brady-tachy syndrome (HCC)    Stroke (HCC)    Tricuspid regurgitation    mild to moderate by echo 08/2020    Past Surgical History:  Procedure Laterality Date   ABDOMINAL HYSTERECTOMY     CARDIAC CATHETERIZATION  2008   Cardiac Stents  2008   COLONOSCOPY  2010   normal    gallstones removed     PARS PLANA VITRECTOMY  06/26/2011   Procedure: PARS PLANA VITRECTOMY WITH 23 GAUGE;  Surgeon: Adonis Brook, MD;  Location: [facility];  Service: Ophthalmology;  Laterality: Left;    Current Medications: Current Meds  Medication Sig  atorvastatin (LIPITOR) 40 MG tablet Take 1 tablet (40 mg total) by mouth daily.   clopidogrel (PLAVIX) 75 MG tablet Take 1 tablet (75 mg total) by mouth daily.   furosemide (LASIX) 20 MG tablet Take 1 tablet (20 mg total) by mouth 3 (three) times a week. For lower extremity swelling.   pantoprazole (PROTONIX) 40 MG tablet Take 1 tablet (40 mg total) by mouth daily.   valsartan (DIOVAN) 80 MG tablet Take 1 tablet (80 mg total) by mouth daily.   Vitamin D, Ergocalciferol, (DRISDOL) 1.25 MG (50000 UNIT) CAPS capsule Take 50,000 Units by mouth once a week.     Allergies:   Fenoprofen calcium    Social History   Socioeconomic History   Marital status: Widowed    Spouse name: [name]   Number of children: 1   Years of education: 20   Highest education level: Not on file  Occupational History   Occupation: Retired  Tobacco Use   Smoking status: Never   Smokeless tobacco: Never  Vaping Use   Vaping Use: Never used  Substance and Sexual Activity   Alcohol use: No   Drug use: No   Sexual activity: Yes    Birth control/protection: Surgical  Other Topics Concern   Not on file  Social History Narrative   Lives with husband   Caffeine use: Tea rare   Social Determinants of Health   Financial Resource Strain: Not on file  Food Insecurity: Not on file  Transportation Needs: Not on file  Physical Activity: Not on file  Stress: Not on file  Social Connections: Not on file     Family History: The patient's family history includes Breast cancer in her sister; Colon cancer in her mother; Heart attack in her father; Heart disease in her mother. There is no history of Anesthesia problems, Hypotension, Malignant hyperthermia, Pseudochol deficiency, Esophageal cancer, Rectal cancer, or Stomach cancer.  ROS:   Review of Systems  Constitutional:  Negative for chills, diaphoresis and fever.  HENT:  Negative for congestion and sore throat.   Respiratory:  Positive for shortness of breath. Negative for wheezing.   Cardiovascular:  Positive for chest pain and leg swelling. Negative for palpitations, orthopnea, claudication and PND.  Gastrointestinal:  Negative for nausea and vomiting.  Genitourinary:  Negative for dysuria, hematuria and urgency.  Musculoskeletal:  Negative for falls.  Skin:  Negative for itching and rash.  Neurological:  Positive for dizziness and headaches. Negative for loss of consciousness.  Endo/Heme/Allergies:  Does not bruise/bleed easily.  Psychiatric/Behavioral:  The patient is not nervous/anxious and does not have insomnia.     EKGs/Labs/Other Studies  Reviewed:    The following studies were reviewed today:  TTE 08/2020: IMPRESSIONS   1. Left ventricular ejection fraction by 3D volume is 58 %. The left ventricle has normal function. The left ventricle has no regional wall motion abnormalities. There is mild left ventricular hypertrophy. Left ventricular diastolic parameters are consistent with Grade II diastolic dysfunction (pseudonormalization).   2. Right ventricular systolic function is normal. The right ventricular size is normal. There is normal pulmonary artery systolic pressure. The estimated right ventricular systolic pressure is [id number] mmHg.   3. The mitral valve is grossly normal. Mild to moderate mitral valve regurgitation. No evidence of mitral stenosis.   4. Tricuspid valve regurgitation is mild to moderate.   5. The aortic valve is grossly normal. Aortic valve regurgitation is mild to moderate. Mild aortic valve sclerosis is present, with no evidence of  aortic valve stenosis. Aortic valve mean gradient measures 8.5 mmHg.   6. The inferior vena cava is normal in size with greater than 50% respiratory variability, suggesting right atrial pressure of 3 mmHg.   Bilateral Carotid Doppler 07/29/2020: Summary:  Right Carotid: The extracranial vessels were near-normal with only minimal  wall                 thickening or plaque.   Left Carotid: The extracranial vessels were near-normal with only minimal  wall                thickening or plaque.   Vertebrals:  Bilateral vertebral arteries demonstrate antegrade flow.  Subclavians: Normal flow hemodynamics were seen in bilateral subclavian               arteries.   Myoview 07/2020: The left ventricular ejection fraction is hyperdynamic (>65%). Nuclear stress EF: 71%. There was no ST segment deviation noted during stress. The study is normal. This is a low risk study. No ischemia or infarction on perfusion images. Normal wall motion.   TTE 09/15/19: IMPRESSIONS   1. Global  longitudinal strain is -23.1%. Left ventricular ejection fraction, by estimation, is 65 to 70%. The left ventricle has normal function. The left ventricle has no regional wall motion abnormalities. Left ventricular diastolic parameters were normal.   2. Right ventricular systolic function is normal. The right ventricular size is normal. There is normal pulmonary artery systolic pressure.   3. There are 2 prominent jets of MR. Mild mitral valve regurgitation.   4. Compared to report from previous echo, aortic valve insufficiency is more prominent. No images to compare. The aortic valve is tricuspid. Aortic valve regurgitation is mild. Mild to moderate aortic valve sclerosis/calcification is present, without any evidence of aortic stenosis.   Comparison(s): 08/13/15 EF 60-65%.  Holter 2018: Sinus bradycardia to sinus rhythm. No AV blocks or pauses > 2 seconds. Frequent PACs, infrequent PVCs.   No AV blocks or pauses > 2 seconds. No diary provided.  No reason for cardiac source of dizziness was identified.   Myoview 2017: Nuclear stress EF: 71%. There was 72mm of horizontal ST segment depression in the lateral precordial leads that became downsloping in recovery. Patient had submaximal exercise treadmill test and was change to a Lexiscan. The nuclear images are normal. This is a low risk study. The left ventricular ejection fraction is hyperdynamic (>65%).  Holter 2017: Sinus bradycardia to sinus rhythm. Frequent PVCs and bigeminy, few triplets. Infrequent PACs.  EKG:  No new tracing today  Recent Labs: 10/03/2021: Hemoglobin 11.9; Platelet Count 186  Recent Lipid Panel    Component Value Date/Time   CHOL 129 10/31/2018 0906   TRIG 59 10/31/2018 0906   HDL 55 10/31/2018 0906   CHOLHDL 2.3 10/31/2018 0906   CHOLHDL 2.3 07/07/2015 0913   VLDL 13 07/07/2015 0913   LDLCALC 61 10/31/2018 0906     Physical Exam:    When her legs were palpated, they were both tender. R>L VS:  BP  (!) 140/60   Pulse 100   Ht 5\' 8"  (1.727 m)   Wt 210 lb (95.3 kg)   SpO2 95%   BMI 31.93 kg/m     Wt Readings from Last 3 Encounters:  05/10/22 210 lb (95.3 kg)  11/14/21 208 lb 3.2 oz (94.4 kg)  10/03/21 207 lb 8 oz (94.1 kg)     GEN: Well nourished, well developed in no acute distress HEENT: Normal NECK: No  JVD; No carotid bruits CARDIAC: RRR,  2/6 systolic murmur. No rubs or gallops RESPIRATORY:  CTAB ABDOMEN: Soft, non-tender, non-distended MUSCULOSKELETAL:  Trace edema, warm SKIN: Warm and dry NEUROLOGIC:  Alert and oriented x 3 PSYCHIATRIC:  Normal affect   ASSESSMENT:    1. Coronary artery disease involving native coronary artery of native heart without angina pectoris   2. Moderate aortic regurgitation   3. Mild mitral valve regurgitation   4. Mixed hyperlipidemia   5. Hyperlipidemia LDL goal <70   6. Bilateral carotid bruits   7. Carotid artery dissection (HCC)     PLAN:    In order of problems listed above:  #Known CAD s/p PCI to LAD and D2 in 2008: #DOE: Most recent myoview 07/2020 without ischemia. TTE with normal BiV function. Continues to have mild shortness of breath and "chest discomfort" with prolonged exertion that is unchanged from prior visits. Given reassuring myoview and lack of progression of symptoms, will continue with medical management at this time. She will contact us if symptoms worsen. -Myoview 07/2020 with no evidence of ischemia or infarction with EF 71% -Continue lipitor 40mg  daily -Continue plavix 75mg  daily -Monitor for progression of symptoms  #FMD with history of carotid dissection: -Carotid ultasound 07/2020 with no significant stenosis -Continue atorvastatin 40mg  daily and plavix 75mg  daily as above -Repeat carotids for monitoring  #HTN: Well controlled and at goal. -Continue valsartan 80mg  daily  #HLD: -Continue lipitor 40mg  daily as above -Monitored by PCP  #Mild-to-moderate AI: #Mild-to-moderate  MR: #Mild-to-Moderate TR: Stable on TTE 08/2020. Will repeat 2024. -Continue serial monitoring with TTE in 08/2022  Follow Up: 6 months  Medication Adjustments/Labs and Tests Ordered: Current medicines are reviewed at length with the patient today.  Concerns regarding medicines are outlined above.  Orders Placed This Encounter  Procedures   ECHOCARDIOGRAM COMPLETE   VAS US CAROTID   No orders of the defined types were placed in this encounter.  Patient Instructions  Medication Instructions:   Your physician recommends that you continue on your current medications as directed. Please refer to the Current Medication list given to you today.  *If you need a refill on your cardiac medications before your next appointment, please call your pharmacy*    Testing/Procedures:  Your physician has requested that you have an echocardiogram. Echocardiography is a painless test that uses sound waves to create images of your heart. It provides your doctor with information about the size and shape of your heart and how well your heart's chambers and valves are working. This procedure takes approximately one hour. There are no restrictions for this procedure.  SCHEDULE ECHO TO BE DONE IN JULY 2024 PER DR. Johney Frame   Please do NOT wear cologne, perfume, aftershave, or lotions (deodorant is allowed). Please arrive 15 minutes prior to your appointment time.    Your physician has requested that you have a carotid duplex. This test is an ultrasound of the carotid arteries in your neck. It looks at blood flow through these arteries that supply the brain with blood. Allow one hour for this exam. There are no restrictions or special instructions.  SCHEDULE ECHO TO BE DONE IN JULY 2024 PER DR. Johney Frame     Follow-Up: At [facility], you and your health needs are our priority.  As part of our continuing mission to provide you with exceptional heart care, we have created designated Provider  Care Teams.  These Care Teams include your primary Cardiologist (physician) and Advanced Practice Providers (APPs -  Physician Assistants and Nurse Practitioners) who all work together to provide you with the care you need, when you need it.  We recommend signing up for the patient portal called "MyChart".  Sign up information is provided on this After Visit Summary.  MyChart is used to connect with patients for Virtual Visits (Telemedicine).  Patients are able to view lab/test results, encounter notes, upcoming appointments, etc.  Non-urgent messages can be sent to your provider as well.   To learn more about what you can do with MyChart, go to [email or web address].    Your next appointment:   6 month(s)  Provider:   [name], MD        Signed, [name], MD  05/10/2022 11:30 AM    [place]

## 2022-05-10 ENCOUNTER — Encounter: Payer: Self-pay | Admitting: Cardiology

## 2022-05-10 ENCOUNTER — Ambulatory Visit: Payer: Medicare PPO | Attending: Cardiology | Admitting: Cardiology

## 2022-05-10 VITALS — BP 140/60 | HR 100 | Ht 68.0 in | Wt 210.0 lb

## 2022-05-10 DIAGNOSIS — I351 Nonrheumatic aortic (valve) insufficiency: Secondary | ICD-10-CM

## 2022-05-10 DIAGNOSIS — E782 Mixed hyperlipidemia: Secondary | ICD-10-CM | POA: Diagnosis not present

## 2022-05-10 DIAGNOSIS — I7771 Dissection of carotid artery: Secondary | ICD-10-CM

## 2022-05-10 DIAGNOSIS — R0989 Other specified symptoms and signs involving the circulatory and respiratory systems: Secondary | ICD-10-CM

## 2022-05-10 DIAGNOSIS — I340 Nonrheumatic mitral (valve) insufficiency: Secondary | ICD-10-CM

## 2022-05-10 DIAGNOSIS — I2510 Atherosclerotic heart disease of native coronary artery without angina pectoris: Secondary | ICD-10-CM | POA: Diagnosis not present

## 2022-05-10 DIAGNOSIS — E785 Hyperlipidemia, unspecified: Secondary | ICD-10-CM | POA: Diagnosis not present

## 2022-05-10 NOTE — Patient Instructions (Signed)
Medication Instructions:   Your physician recommends that you continue on your current medications as directed. Please refer to the Current Medication list given to you today.  *If you need a refill on your cardiac medications before your next appointment, please call your pharmacy*    Testing/Procedures:  Your physician has requested that you have an echocardiogram. Echocardiography is a painless test that uses sound waves to create images of your heart. It provides your doctor with information about the size and shape of your heart and how well your heart's chambers and valves are working. This procedure takes approximately one hour. There are no restrictions for this procedure.  SCHEDULE ECHO TO BE DONE IN JULY 2024 PER DR. Johney Frame   Please do NOT wear cologne, perfume, aftershave, or lotions (deodorant is allowed). Please arrive 15 minutes prior to your appointment time.    Your physician has requested that you have a carotid duplex. This test is an ultrasound of the carotid arteries in your neck. It looks at blood flow through these arteries that supply the brain with blood. Allow one hour for this exam. There are no restrictions or special instructions.  SCHEDULE ECHO TO BE DONE IN JULY 2024 PER DR. Johney Frame     Follow-Up: At [facility], you and your health needs are our priority.  As part of our continuing mission to provide you with exceptional heart care, we have created designated Provider Care Teams.  These Care Teams include your primary Cardiologist (physician) and Advanced Practice Providers (APPs -  Physician Assistants and Nurse Practitioners) who all work together to provide you with the care you need, when you need it.  We recommend signing up for the patient portal called "MyChart".  Sign up information is provided on this After Visit Summary.  MyChart is used to connect with patients for Virtual Visits (Telemedicine).  Patients are able to view lab/test  results, encounter notes, upcoming appointments, etc.  Non-urgent messages can be sent to your provider as well.   To learn more about what you can do with MyChart, go to [email or web address].    Your next appointment:   6 month(s)  Provider:   [name], MD

## 2022-06-19 DIAGNOSIS — K625 Hemorrhage of anus and rectum: Secondary | ICD-10-CM | POA: Diagnosis not present

## 2022-06-19 DIAGNOSIS — K6289 Other specified diseases of anus and rectum: Secondary | ICD-10-CM | POA: Diagnosis not present

## 2022-07-24 DIAGNOSIS — K602 Anal fissure, unspecified: Secondary | ICD-10-CM | POA: Diagnosis not present

## 2022-08-29 DIAGNOSIS — I2510 Atherosclerotic heart disease of native coronary artery without angina pectoris: Secondary | ICD-10-CM | POA: Diagnosis not present

## 2022-08-29 DIAGNOSIS — R350 Frequency of micturition: Secondary | ICD-10-CM | POA: Diagnosis not present

## 2022-08-29 DIAGNOSIS — I10 Essential (primary) hypertension: Secondary | ICD-10-CM | POA: Diagnosis not present

## 2022-08-29 DIAGNOSIS — E559 Vitamin D deficiency, unspecified: Secondary | ICD-10-CM | POA: Diagnosis not present

## 2022-09-05 ENCOUNTER — Ambulatory Visit (HOSPITAL_COMMUNITY): Payer: Medicare PPO | Attending: Cardiology

## 2022-09-05 ENCOUNTER — Ambulatory Visit (HOSPITAL_COMMUNITY): Payer: Medicare PPO

## 2022-09-07 ENCOUNTER — Encounter: Payer: Self-pay | Admitting: Registered Nurse

## 2022-09-07 DIAGNOSIS — I4891 Unspecified atrial fibrillation: Secondary | ICD-10-CM | POA: Diagnosis not present

## 2022-09-07 DIAGNOSIS — E7800 Pure hypercholesterolemia, unspecified: Secondary | ICD-10-CM | POA: Diagnosis not present

## 2022-09-07 DIAGNOSIS — Z0000 Encounter for general adult medical examination without abnormal findings: Secondary | ICD-10-CM | POA: Diagnosis not present

## 2022-09-07 DIAGNOSIS — D72819 Decreased white blood cell count, unspecified: Secondary | ICD-10-CM | POA: Diagnosis not present

## 2022-09-07 DIAGNOSIS — E559 Vitamin D deficiency, unspecified: Secondary | ICD-10-CM | POA: Diagnosis not present

## 2022-09-07 DIAGNOSIS — N183 Chronic kidney disease, stage 3 unspecified: Secondary | ICD-10-CM | POA: Diagnosis not present

## 2022-09-07 DIAGNOSIS — I2510 Atherosclerotic heart disease of native coronary artery without angina pectoris: Secondary | ICD-10-CM | POA: Diagnosis not present

## 2022-09-07 DIAGNOSIS — Z8673 Personal history of transient ischemic attack (TIA), and cerebral infarction without residual deficits: Secondary | ICD-10-CM | POA: Diagnosis not present

## 2022-09-07 DIAGNOSIS — I10 Essential (primary) hypertension: Secondary | ICD-10-CM | POA: Diagnosis not present

## 2022-09-12 ENCOUNTER — Encounter (HOSPITAL_COMMUNITY): Payer: Self-pay | Admitting: Cardiology

## 2022-09-14 ENCOUNTER — Encounter: Payer: Self-pay | Admitting: Cardiology

## 2022-10-04 ENCOUNTER — Encounter (HOSPITAL_COMMUNITY): Payer: Medicare PPO

## 2022-10-05 ENCOUNTER — Inpatient Hospital Stay: Payer: Medicare PPO | Attending: Internal Medicine

## 2022-10-05 ENCOUNTER — Ambulatory Visit (HOSPITAL_COMMUNITY)
Admission: RE | Admit: 2022-10-05 | Discharge: 2022-10-05 | Disposition: A | Discharge status: Home / Self Care | Payer: Medicare PPO | Source: Ambulatory Visit | Attending: Cardiology | Admitting: Cardiology

## 2022-10-05 ENCOUNTER — Ambulatory Visit (HOSPITAL_BASED_OUTPATIENT_CLINIC_OR_DEPARTMENT_OTHER): Payer: Medicare PPO

## 2022-10-05 ENCOUNTER — Inpatient Hospital Stay: Payer: Medicare PPO | Admitting: Hematology and Oncology

## 2022-10-05 DIAGNOSIS — I351 Nonrheumatic aortic (valve) insufficiency: Secondary | ICD-10-CM | POA: Diagnosis not present

## 2022-10-05 DIAGNOSIS — I7771 Dissection of carotid artery: Secondary | ICD-10-CM | POA: Insufficient documentation

## 2022-10-05 DIAGNOSIS — R0989 Other specified symptoms and signs involving the circulatory and respiratory systems: Secondary | ICD-10-CM | POA: Diagnosis not present

## 2022-10-05 DIAGNOSIS — I340 Nonrheumatic mitral (valve) insufficiency: Secondary | ICD-10-CM

## 2022-10-05 LAB — ECHOCARDIOGRAM COMPLETE
AV Vena cont: 0.3 cm
Area-P 1/2: 2.71 cm2
Calc EF: 60.4 %
MV M vel: 5.78 m/s
MV Peak grad: 133.6 mmHg
P 1/2 time: 810 msec
Radius: 0.3 cm
S' Lateral: 2.6 cm
Single Plane A2C EF: 64.8 %
Single Plane A4C EF: 56.2 %

## 2022-10-05 NOTE — Assessment & Plan Note (Deleted)
Leukopenia dating back to at least 3 years 7 years ago the white count was 4.5 2017: WBC 3.1, hemoglobin 12.5 2019: WBC 2.9, hemoglobin 11.8 10/31/2018: WBC 2.7, hemoglobin 11 MCV 87 05/28/2019: WBC 3.3, ANC 1.6, hemoglobin 11 08/05/2020: WBC 2.7 02/22/2021: WBC 3.2 08/19/2021: WBC 2.2 10/03/2021: WBC 2.8   Work-up performed previously:  B12 445, folic acid 30.5 ANA negative CMP revealed a creatinine 1.5 Reticulocyte count 1.2%   Cause: Medication versus Cyclical versus ethnicity related Continue to watch and monitor Return to clinic in 1 year for follow-up with labs

## 2022-10-09 ENCOUNTER — Telehealth: Payer: Self-pay | Admitting: *Deleted

## 2022-10-09 NOTE — Telephone Encounter (Signed)
-----   Message from [place] sent at 10/06/2022 10:18 AM EDT ----- Regarding: RE: NEEDS NEW CARDS ASSIGNED Pt is scheduled for 12/12/22 at 11am with Dr. Clifton James ----- Message ----- From: Loa Socks, LPN Sent: [id number]/[id number]   2:30 PM EDT To: Jabier Gauss; Rolly Pancake; Garald Braver; # Subject: NEEDS NEW CARDS ASSIGNED                       Hey all, this pt is former Dr. Shari Prows pt and needs to establish with a new Cardiologist.   She is due to be seen for 6 month follow-up at any available appt with a Cardiologist.  Technically she is due Aug/Sept with our practice.  We need to get her assigned to someone so that I can send her recent carotid US results to her new Cardiologist to result, given Dr. Shari Prows is not here to do so.   Can you please get her assigned to a new Provider, then send me with who you go with,  so that I can route her carotid US results to them to finalize?  Thanks for all your help!  Lajoyce Corners

## 2022-10-18 DIAGNOSIS — N1831 Chronic kidney disease, stage 3a: Secondary | ICD-10-CM | POA: Diagnosis not present

## 2022-10-18 DIAGNOSIS — I129 Hypertensive chronic kidney disease with stage 1 through stage 4 chronic kidney disease, or unspecified chronic kidney disease: Secondary | ICD-10-CM | POA: Diagnosis not present

## 2022-10-18 DIAGNOSIS — I773 Arterial fibromuscular dysplasia: Secondary | ICD-10-CM | POA: Diagnosis not present

## 2022-10-18 DIAGNOSIS — I2510 Atherosclerotic heart disease of native coronary artery without angina pectoris: Secondary | ICD-10-CM | POA: Diagnosis not present

## 2022-11-14 ENCOUNTER — Ambulatory Visit: Payer: Medicare PPO | Admitting: Cardiology

## 2022-11-22 ENCOUNTER — Other Ambulatory Visit (HOSPITAL_BASED_OUTPATIENT_CLINIC_OR_DEPARTMENT_OTHER): Payer: Self-pay | Admitting: *Deleted

## 2022-11-22 DIAGNOSIS — R600 Localized edema: Secondary | ICD-10-CM

## 2022-11-22 DIAGNOSIS — M79661 Pain in right lower leg: Secondary | ICD-10-CM

## 2022-11-22 MED ORDER — CLOPIDOGREL BISULFATE 75 MG PO TABS: 75.0000 mg | ORAL_TABLET | Freq: Every day | ORAL | 1 refills | Status: DC

## 2022-12-11 NOTE — Progress Notes (Unsigned)
No chief complaint on file.  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 She has been followed by Dr. Shari Prows. She is known to have CAD and had PCI of the LAD and Diagonal in 2008. Nuclear stress test in 2022 with no ischemia. Cardiac monitor in 2017 with sinus brady and frequent PVCs. Echo July 2022 with LVEF=58%, grade 2 diastolic dysfunction, mild to moderate TR, mild to moderate AI. She was seen in September 2023 and had c/o chest pain and dyspnea as well as asymmetric calf tenderness and swelling. Venous doppler negative for DVT. Carotid artery dopplers August 2024 with no evidence of carotid artery disease. Echo August 2024 with LVEF=65-70%. Mild MR. Mild to moderate AI.   She is here today for follow up. The patient denies any chest pain, dyspnea, palpitations, lower extremity edema, orthopnea, PND, dizziness, near syncope or syncope.   Primary Care Physician: Pearson Grippe, MD   Past Medical History:  Diagnosis Date   Aortic insufficiency    mild to moderate by echo 08/2020   Carotid artery dissection (HCC)    a. remote LICA dissection.   CKD (chronic kidney disease), stage III (HCC)    Coronary artery disease    a. remote stents to LAD and large diagonal branch. b. PTCA of diag for ISR 2007. c. PTCA/DES to diagonal and PTCA of mid LAD 01/2007.   Fibromuscular dysplasia (HCC)    GERD (gastroesophageal reflux disease)    Headache(784.0)    hx of   Heart murmur    Hyperlipidemia    Hypertension    Internal hemorrhoids without mention of complication    Lower extremity edema    Lower extremity edema    Mitral regurgitation    mild to moderate by echo 08/2020   Neutropenia, unspecified (HCC)    Obesity    Premature atrial contractions    PVC's (premature ventricular contractions)    Sinus brady-tachy  syndrome (HCC)    Stroke (HCC)    Tricuspid regurgitation    mild to moderate by echo 08/2020    Past Surgical History:  Procedure Laterality Date   ABDOMINAL HYSTERECTOMY     CARDIAC CATHETERIZATION  2008   Cardiac Stents  2008   COLONOSCOPY  2010   normal    gallstones removed     PARS PLANA VITRECTOMY  06/26/2011   Procedure: PARS PLANA VITRECTOMY WITH 23 GAUGE;  Surgeon: Shade Flood, MD;  Location: [facility] OR;  Service: Ophthalmology;  Laterality: Left;    Current Outpatient Medications  Medication Sig Dispense Refill   atorvastatin (LIPITOR) 40 MG tablet Take 1 tablet (40 mg total) by mouth daily. 90 tablet 3   clopidogrel (PLAVIX) 75 MG tablet Take 1 tablet (75 mg total) by mouth daily. 90 tablet 1   furosemide (LASIX) 20 MG tablet Take 1 tablet (20 mg total) by mouth 3 (three) times a week. For lower extremity swelling. 30 tablet 2   pantoprazole (PROTONIX) 40 MG tablet Take 1 tablet (40 mg total) by mouth daily. 90 tablet 3   valsartan (DIOVAN) 80 MG tablet Take 1 tablet (80 mg total) by mouth daily. 90 tablet 3   Vitamin D, Ergocalciferol, (DRISDOL) 1.25 MG (50000 UNIT) CAPS capsule Take 50,000 Units by mouth once a week.     No current facility-administered medications for this visit.  Allergies  Allergen Reactions   Fenoprofen Calcium Other (See Comments)    Stopped kidneys from functioning Stopped kidneys from functioning    Social History   Socioeconomic History   Marital status: Widowed    Spouse name: Merton Border   Number of children: 1   Years of education: 20   Highest education level: Not on file  Occupational History   Occupation: Retired  Tobacco Use   Smoking status: Never   Smokeless tobacco: Never  Vaping Use   Vaping status: Never Used  Substance and Sexual Activity   Alcohol use: No   Drug use: No   Sexual activity: Yes    Birth control/protection: Surgical  Other Topics Concern   Not on file  Social History Narrative   Lives with husband    Caffeine use: Tea rare   Social Determinants of Health   Financial Resource Strain: Not on file  Food Insecurity: Not on file  Transportation Needs: Not on file  Physical Activity: Not on file  Stress: Not on file  Social Connections: Not on file  Intimate Partner Violence: Not on file    Family History  Problem Relation Age of Onset   Heart disease Mother    Colon cancer Mother    Heart attack Father    Breast cancer Sister        x 2   Anesthesia problems Neg Hx    Hypotension Neg Hx    Malignant hyperthermia Neg Hx    Pseudochol deficiency Neg Hx    Esophageal cancer Neg Hx    Rectal cancer Neg Hx    Stomach cancer Neg Hx     Review of Systems:  As stated in the HPI and otherwise negative.   There were no vitals taken for this visit.  Physical Examination: General: Well developed, well nourished, NAD  HEENT: OP clear, mucus membranes moist  SKIN: warm, dry. No rashes. Neuro: No focal deficits  Musculoskeletal: Muscle strength 5/5 all ext  Psychiatric: Mood and affect normal  Neck: No JVD, no carotid bruits, no thyromegaly, no lymphadenopathy.  Lungs:Clear bilaterally, no wheezes, rhonci, crackles Cardiovascular: Regular rate and rhythm. No murmurs, gallops or rubs. Abdomen:Soft. Bowel sounds present. Non-tender.  Extremities: No lower extremity edema. Pulses are 2 + in the bilateral DP/PT.  EKG:  EKG {ACTION; IS/IS [id number]} ordered today. The ekg ordered today demonstrates ***  Recent Labs: No results found for requested labs within last 365 days.   Lipid Panel    Component Value Date/Time   CHOL 129 10/31/2018 0906   TRIG 59 10/31/2018 0906   HDL 55 10/31/2018 0906   CHOLHDL 2.3 10/31/2018 0906   CHOLHDL 2.3 07/07/2015 0913   VLDL 13 07/07/2015 0913   LDLCALC 61 10/31/2018 0906     Wt Readings from Last 3 Encounters:  05/10/22 95.3 kg  11/14/21 94.4 kg  10/03/21 94.1 kg    Assessment and Plan:   1. CAD without angina: No chest pain  suggestive of angina. Continue Plavix and statin.   2. Aortic insufficiency: Mild to moderate by echo in August 2024.   3. Mitral regurgitation: Mild by echo in August 2024.   4. Hyperlipidemia: Lipids followed in primary care. LDL ***. Continue statin  5. Carotid artery dissection: Carotid dopplers August 2024 with no evidence of disease.   Labs/ tests ordered today include:  No orders of the defined types were placed in this encounter.  Disposition:   F/U with me in one year.  Signed, Verne Carrow, MD, [facility] 12/11/2022 2:27 PM    [facility] [address] [place], [place], [place]  [address] Phone: [phone]; Fax: [phone]

## 2022-12-12 ENCOUNTER — Ambulatory Visit: Payer: Medicare PPO | Attending: Cardiovascular Disease | Admitting: Cardiovascular Disease

## 2022-12-12 ENCOUNTER — Encounter: Payer: Self-pay | Admitting: Cardiovascular Disease

## 2022-12-12 VITALS — BP 112/64 | HR 45 | Ht 68.0 in | Wt 206.8 lb

## 2022-12-12 DIAGNOSIS — E785 Hyperlipidemia, unspecified: Secondary | ICD-10-CM | POA: Diagnosis not present

## 2022-12-12 DIAGNOSIS — I2510 Atherosclerotic heart disease of native coronary artery without angina pectoris: Secondary | ICD-10-CM | POA: Diagnosis not present

## 2022-12-12 DIAGNOSIS — I340 Nonrheumatic mitral (valve) insufficiency: Secondary | ICD-10-CM | POA: Diagnosis not present

## 2022-12-12 DIAGNOSIS — I7771 Dissection of carotid artery: Secondary | ICD-10-CM | POA: Diagnosis not present

## 2022-12-12 DIAGNOSIS — I351 Nonrheumatic aortic (valve) insufficiency: Secondary | ICD-10-CM

## 2022-12-12 NOTE — Patient Instructions (Signed)

## 2023-01-17 ENCOUNTER — Other Ambulatory Visit: Payer: Self-pay

## 2023-01-17 DIAGNOSIS — R600 Localized edema: Secondary | ICD-10-CM

## 2023-01-17 DIAGNOSIS — M79661 Pain in right lower leg: Secondary | ICD-10-CM

## 2023-01-17 MED ORDER — ATORVASTATIN CALCIUM 40 MG PO TABS: 40.0000 mg | ORAL_TABLET | Freq: Every day | ORAL | 3 refills | Status: AC

## 2023-01-29 DIAGNOSIS — Z6831 Body mass index (BMI) 31.0-31.9, adult: Secondary | ICD-10-CM | POA: Diagnosis not present

## 2023-01-29 DIAGNOSIS — I129 Hypertensive chronic kidney disease with stage 1 through stage 4 chronic kidney disease, or unspecified chronic kidney disease: Secondary | ICD-10-CM | POA: Diagnosis not present

## 2023-01-29 DIAGNOSIS — I4891 Unspecified atrial fibrillation: Secondary | ICD-10-CM | POA: Diagnosis not present

## 2023-01-29 DIAGNOSIS — E669 Obesity, unspecified: Secondary | ICD-10-CM | POA: Diagnosis not present

## 2023-01-29 DIAGNOSIS — D6869 Other thrombophilia: Secondary | ICD-10-CM | POA: Diagnosis not present

## 2023-01-29 DIAGNOSIS — K219 Gastro-esophageal reflux disease without esophagitis: Secondary | ICD-10-CM | POA: Diagnosis not present

## 2023-01-29 DIAGNOSIS — I739 Peripheral vascular disease, unspecified: Secondary | ICD-10-CM | POA: Diagnosis not present

## 2023-01-29 DIAGNOSIS — N1831 Chronic kidney disease, stage 3a: Secondary | ICD-10-CM | POA: Diagnosis not present

## 2023-01-29 DIAGNOSIS — E785 Hyperlipidemia, unspecified: Secondary | ICD-10-CM | POA: Diagnosis not present

## 2023-01-29 DIAGNOSIS — E559 Vitamin D deficiency, unspecified: Secondary | ICD-10-CM | POA: Diagnosis not present

## 2023-01-29 DIAGNOSIS — I2510 Atherosclerotic heart disease of native coronary artery without angina pectoris: Secondary | ICD-10-CM | POA: Diagnosis not present

## 2023-01-29 DIAGNOSIS — I773 Arterial fibromuscular dysplasia: Secondary | ICD-10-CM | POA: Diagnosis not present

## 2023-01-31 ENCOUNTER — Other Ambulatory Visit: Payer: Self-pay | Admitting: *Deleted

## 2023-01-31 DIAGNOSIS — R600 Localized edema: Secondary | ICD-10-CM

## 2023-01-31 DIAGNOSIS — M79661 Pain in right lower leg: Secondary | ICD-10-CM

## 2023-01-31 MED ORDER — PANTOPRAZOLE SODIUM 40 MG PO TBEC: 40.0000 mg | DELAYED_RELEASE_TABLET | Freq: Every day | ORAL | 3 refills | Status: AC

## 2023-03-01 DIAGNOSIS — E785 Hyperlipidemia, unspecified: Secondary | ICD-10-CM | POA: Diagnosis not present

## 2023-03-01 LAB — LAB REPORT - SCANNED: EGFR: 42

## 2023-03-08 DIAGNOSIS — I2510 Atherosclerotic heart disease of native coronary artery without angina pectoris: Secondary | ICD-10-CM | POA: Diagnosis not present

## 2023-03-08 DIAGNOSIS — E7800 Pure hypercholesterolemia, unspecified: Secondary | ICD-10-CM | POA: Diagnosis not present

## 2023-03-08 DIAGNOSIS — I10 Essential (primary) hypertension: Secondary | ICD-10-CM | POA: Diagnosis not present

## 2023-03-08 DIAGNOSIS — G8929 Other chronic pain: Secondary | ICD-10-CM | POA: Diagnosis not present

## 2023-03-08 DIAGNOSIS — R2681 Unsteadiness on feet: Secondary | ICD-10-CM | POA: Diagnosis not present

## 2023-03-08 DIAGNOSIS — M545 Low back pain, unspecified: Secondary | ICD-10-CM | POA: Diagnosis not present

## 2023-03-08 DIAGNOSIS — I739 Peripheral vascular disease, unspecified: Secondary | ICD-10-CM | POA: Diagnosis not present

## 2023-03-08 DIAGNOSIS — E559 Vitamin D deficiency, unspecified: Secondary | ICD-10-CM | POA: Diagnosis not present

## 2023-03-09 DIAGNOSIS — Z6841 Body Mass Index (BMI) 40.0 and over, adult: Secondary | ICD-10-CM | POA: Diagnosis not present

## 2023-03-09 DIAGNOSIS — H6123 Impacted cerumen, bilateral: Secondary | ICD-10-CM | POA: Diagnosis not present

## 2023-03-15 DIAGNOSIS — Z23 Encounter for immunization: Secondary | ICD-10-CM | POA: Diagnosis not present

## 2023-04-04 DIAGNOSIS — R2681 Unsteadiness on feet: Secondary | ICD-10-CM | POA: Diagnosis not present

## 2023-05-16 DIAGNOSIS — Z1231 Encounter for screening mammogram for malignant neoplasm of breast: Secondary | ICD-10-CM | POA: Diagnosis not present

## 2023-06-15 DIAGNOSIS — R269 Unspecified abnormalities of gait and mobility: Secondary | ICD-10-CM | POA: Diagnosis not present

## 2023-06-15 DIAGNOSIS — M6281 Muscle weakness (generalized): Secondary | ICD-10-CM | POA: Diagnosis not present

## 2023-06-15 DIAGNOSIS — I639 Cerebral infarction, unspecified: Secondary | ICD-10-CM | POA: Diagnosis not present

## 2023-06-19 DIAGNOSIS — N1832 Chronic kidney disease, stage 3b: Secondary | ICD-10-CM | POA: Diagnosis not present

## 2023-06-19 DIAGNOSIS — I129 Hypertensive chronic kidney disease with stage 1 through stage 4 chronic kidney disease, or unspecified chronic kidney disease: Secondary | ICD-10-CM | POA: Diagnosis not present

## 2023-06-19 DIAGNOSIS — I773 Arterial fibromuscular dysplasia: Secondary | ICD-10-CM | POA: Diagnosis not present

## 2023-06-19 DIAGNOSIS — I2510 Atherosclerotic heart disease of native coronary artery without angina pectoris: Secondary | ICD-10-CM | POA: Diagnosis not present

## 2023-06-25 DIAGNOSIS — I639 Cerebral infarction, unspecified: Secondary | ICD-10-CM | POA: Diagnosis not present

## 2023-06-25 DIAGNOSIS — M6281 Muscle weakness (generalized): Secondary | ICD-10-CM | POA: Diagnosis not present

## 2023-06-25 DIAGNOSIS — R269 Unspecified abnormalities of gait and mobility: Secondary | ICD-10-CM | POA: Diagnosis not present

## 2023-06-29 DIAGNOSIS — J069 Acute upper respiratory infection, unspecified: Secondary | ICD-10-CM | POA: Diagnosis not present

## 2023-07-12 ENCOUNTER — Other Ambulatory Visit (HOSPITAL_BASED_OUTPATIENT_CLINIC_OR_DEPARTMENT_OTHER): Payer: Self-pay | Admitting: Cardiology

## 2023-07-12 DIAGNOSIS — M79661 Pain in right lower leg: Secondary | ICD-10-CM

## 2023-07-12 DIAGNOSIS — R600 Localized edema: Secondary | ICD-10-CM

## 2023-09-10 DIAGNOSIS — E559 Vitamin D deficiency, unspecified: Secondary | ICD-10-CM | POA: Diagnosis not present

## 2023-09-10 DIAGNOSIS — I10 Essential (primary) hypertension: Secondary | ICD-10-CM | POA: Diagnosis not present

## 2023-09-10 DIAGNOSIS — Z0000 Encounter for general adult medical examination without abnormal findings: Secondary | ICD-10-CM | POA: Diagnosis not present

## 2023-09-10 LAB — LAB REPORT - SCANNED: EGFR: 43

## 2023-09-21 DIAGNOSIS — D72819 Decreased white blood cell count, unspecified: Secondary | ICD-10-CM | POA: Diagnosis not present

## 2023-09-21 DIAGNOSIS — I10 Essential (primary) hypertension: Secondary | ICD-10-CM | POA: Diagnosis not present

## 2023-09-21 DIAGNOSIS — I2510 Atherosclerotic heart disease of native coronary artery without angina pectoris: Secondary | ICD-10-CM | POA: Diagnosis not present

## 2023-09-21 DIAGNOSIS — E785 Hyperlipidemia, unspecified: Secondary | ICD-10-CM | POA: Diagnosis not present

## 2023-09-21 DIAGNOSIS — M25552 Pain in left hip: Secondary | ICD-10-CM | POA: Diagnosis not present

## 2023-09-21 DIAGNOSIS — M25551 Pain in right hip: Secondary | ICD-10-CM | POA: Diagnosis not present

## 2023-09-21 DIAGNOSIS — Z0000 Encounter for general adult medical examination without abnormal findings: Secondary | ICD-10-CM | POA: Diagnosis not present

## 2023-09-21 DIAGNOSIS — I4891 Unspecified atrial fibrillation: Secondary | ICD-10-CM | POA: Diagnosis not present

## 2023-09-21 DIAGNOSIS — E559 Vitamin D deficiency, unspecified: Secondary | ICD-10-CM | POA: Diagnosis not present

## 2023-10-02 ENCOUNTER — Telehealth: Payer: Self-pay | Admitting: Cardiovascular Disease

## 2023-10-02 NOTE — Telephone Encounter (Signed)
   Pre-operative Risk Assessment    Patient Name: [name]  DOB: 04-07-1945 MRN: [id number]   Date of last office visit: 12/12/22 Date of next office visit: nothing scheduled    Request for Surgical Clearance    Procedure:  invasive gum therapy for periodontal disease   Date of Surgery:  Clearance 10/02/23                                Surgeon:  Dr. Mal Else Surgeon's Group or Practice Name:  [facility]   Phone number:  [phone]  Fax number:  [phone]    Type of Clearance Requested:   - Medical    Type of Anesthesia:  None    Additional requests/questions:  Does this patient need antibiotics?  Bonney Sheffield JONELLE Lenora   10/02/2023, 10:16 AM

## 2023-10-02 NOTE — Telephone Encounter (Signed)
   Patient Name: [name]  DOB: Apr 18, 1945 MRN: [id number]  Primary Cardiologist: Lonni Cash, MD  Chart reviewed as part of pre-operative protocol coverage.   Simple dental extractions (i.e. 1-2 teeth) and cleanings are considered low risk procedures per guidelines and generally do not require any specific cardiac clearance. It is also generally accepted that for simple extractions and dental cleanings, there is no need to interrupt blood thinner therapy.  SBE prophylaxis is not required for the patient from a cardiac standpoint.  I will route this recommendation to the requesting party via Epic fax function and remove from pre-op pool.  Please call with questions.  Nasirah Sachs D Alaa Eyerman, NP 10/02/2023, 10:28 AM

## 2023-10-29 DIAGNOSIS — H903 Sensorineural hearing loss, bilateral: Secondary | ICD-10-CM | POA: Diagnosis not present

## 2023-11-14 DIAGNOSIS — M79643 Pain in unspecified hand: Secondary | ICD-10-CM | POA: Diagnosis not present

## 2023-11-14 DIAGNOSIS — M25562 Pain in left knee: Secondary | ICD-10-CM | POA: Diagnosis not present

## 2023-11-14 DIAGNOSIS — M25552 Pain in left hip: Secondary | ICD-10-CM | POA: Diagnosis not present

## 2023-11-14 DIAGNOSIS — M79642 Pain in left hand: Secondary | ICD-10-CM | POA: Diagnosis not present

## 2023-11-14 DIAGNOSIS — M25561 Pain in right knee: Secondary | ICD-10-CM | POA: Diagnosis not present

## 2023-11-14 DIAGNOSIS — M7060 Trochanteric bursitis, unspecified hip: Secondary | ICD-10-CM | POA: Diagnosis not present

## 2023-11-14 DIAGNOSIS — I878 Other specified disorders of veins: Secondary | ICD-10-CM | POA: Diagnosis not present

## 2023-11-14 DIAGNOSIS — M79671 Pain in right foot: Secondary | ICD-10-CM | POA: Diagnosis not present

## 2023-11-14 DIAGNOSIS — M79606 Pain in leg, unspecified: Secondary | ICD-10-CM | POA: Diagnosis not present

## 2023-11-14 DIAGNOSIS — M25559 Pain in unspecified hip: Secondary | ICD-10-CM | POA: Diagnosis not present

## 2023-11-14 DIAGNOSIS — M2550 Pain in unspecified joint: Secondary | ICD-10-CM | POA: Diagnosis not present

## 2023-11-14 DIAGNOSIS — M791 Myalgia, unspecified site: Secondary | ICD-10-CM | POA: Diagnosis not present

## 2023-11-14 DIAGNOSIS — M79672 Pain in left foot: Secondary | ICD-10-CM | POA: Diagnosis not present

## 2023-11-14 DIAGNOSIS — M79641 Pain in right hand: Secondary | ICD-10-CM | POA: Diagnosis not present

## 2023-11-14 DIAGNOSIS — M549 Dorsalgia, unspecified: Secondary | ICD-10-CM | POA: Diagnosis not present

## 2023-11-14 DIAGNOSIS — M25551 Pain in right hip: Secondary | ICD-10-CM | POA: Diagnosis not present

## 2023-11-14 DIAGNOSIS — M25569 Pain in unspecified knee: Secondary | ICD-10-CM | POA: Diagnosis not present

## 2023-12-12 DIAGNOSIS — M7060 Trochanteric bursitis, unspecified hip: Secondary | ICD-10-CM | POA: Diagnosis not present

## 2023-12-12 DIAGNOSIS — R760 Raised antibody titer: Secondary | ICD-10-CM | POA: Diagnosis not present

## 2023-12-12 DIAGNOSIS — H04129 Dry eye syndrome of unspecified lacrimal gland: Secondary | ICD-10-CM | POA: Diagnosis not present

## 2023-12-12 DIAGNOSIS — M3500 Sicca syndrome, unspecified: Secondary | ICD-10-CM | POA: Diagnosis not present

## 2023-12-12 DIAGNOSIS — M25559 Pain in unspecified hip: Secondary | ICD-10-CM | POA: Diagnosis not present

## 2023-12-12 DIAGNOSIS — M1990 Unspecified osteoarthritis, unspecified site: Secondary | ICD-10-CM | POA: Diagnosis not present

## 2023-12-12 DIAGNOSIS — N1831 Chronic kidney disease, stage 3a: Secondary | ICD-10-CM | POA: Diagnosis not present

## 2023-12-12 DIAGNOSIS — D72819 Decreased white blood cell count, unspecified: Secondary | ICD-10-CM | POA: Diagnosis not present

## 2023-12-17 DIAGNOSIS — I129 Hypertensive chronic kidney disease with stage 1 through stage 4 chronic kidney disease, or unspecified chronic kidney disease: Secondary | ICD-10-CM | POA: Diagnosis not present

## 2023-12-17 DIAGNOSIS — D72819 Decreased white blood cell count, unspecified: Secondary | ICD-10-CM | POA: Diagnosis not present

## 2023-12-17 DIAGNOSIS — I872 Venous insufficiency (chronic) (peripheral): Secondary | ICD-10-CM | POA: Diagnosis not present

## 2023-12-17 DIAGNOSIS — N1831 Chronic kidney disease, stage 3a: Secondary | ICD-10-CM | POA: Diagnosis not present

## 2023-12-17 DIAGNOSIS — I081 Rheumatic disorders of both mitral and tricuspid valves: Secondary | ICD-10-CM | POA: Diagnosis not present

## 2023-12-17 DIAGNOSIS — I2510 Atherosclerotic heart disease of native coronary artery without angina pectoris: Secondary | ICD-10-CM | POA: Diagnosis not present

## 2023-12-17 DIAGNOSIS — M47816 Spondylosis without myelopathy or radiculopathy, lumbar region: Secondary | ICD-10-CM | POA: Diagnosis not present

## 2023-12-17 DIAGNOSIS — M7061 Trochanteric bursitis, right hip: Secondary | ICD-10-CM | POA: Diagnosis not present

## 2023-12-17 DIAGNOSIS — M3500 Sicca syndrome, unspecified: Secondary | ICD-10-CM | POA: Diagnosis not present

## 2024-01-01 DIAGNOSIS — I872 Venous insufficiency (chronic) (peripheral): Secondary | ICD-10-CM | POA: Diagnosis not present

## 2024-01-01 DIAGNOSIS — I081 Rheumatic disorders of both mitral and tricuspid valves: Secondary | ICD-10-CM | POA: Diagnosis not present

## 2024-01-01 DIAGNOSIS — N1831 Chronic kidney disease, stage 3a: Secondary | ICD-10-CM | POA: Diagnosis not present

## 2024-01-01 DIAGNOSIS — I2510 Atherosclerotic heart disease of native coronary artery without angina pectoris: Secondary | ICD-10-CM | POA: Diagnosis not present

## 2024-01-01 DIAGNOSIS — M3500 Sicca syndrome, unspecified: Secondary | ICD-10-CM | POA: Diagnosis not present

## 2024-01-01 DIAGNOSIS — M7061 Trochanteric bursitis, right hip: Secondary | ICD-10-CM | POA: Diagnosis not present

## 2024-01-01 DIAGNOSIS — M47816 Spondylosis without myelopathy or radiculopathy, lumbar region: Secondary | ICD-10-CM | POA: Diagnosis not present

## 2024-01-01 DIAGNOSIS — D72819 Decreased white blood cell count, unspecified: Secondary | ICD-10-CM | POA: Diagnosis not present

## 2024-01-03 ENCOUNTER — Telehealth: Payer: Self-pay | Admitting: Cardiovascular Disease

## 2024-01-03 NOTE — Telephone Encounter (Signed)
 Left message for patient to return the call.

## 2024-01-03 NOTE — Telephone Encounter (Signed)
 Pt requesting callback regarding signature for her handicap sticker. Please advise.

## 2024-01-10 DIAGNOSIS — I872 Venous insufficiency (chronic) (peripheral): Secondary | ICD-10-CM | POA: Diagnosis not present

## 2024-01-10 DIAGNOSIS — M3500 Sicca syndrome, unspecified: Secondary | ICD-10-CM | POA: Diagnosis not present

## 2024-01-10 DIAGNOSIS — D72819 Decreased white blood cell count, unspecified: Secondary | ICD-10-CM | POA: Diagnosis not present

## 2024-01-10 DIAGNOSIS — I2510 Atherosclerotic heart disease of native coronary artery without angina pectoris: Secondary | ICD-10-CM | POA: Diagnosis not present

## 2024-01-10 DIAGNOSIS — N1831 Chronic kidney disease, stage 3a: Secondary | ICD-10-CM | POA: Diagnosis not present

## 2024-01-10 DIAGNOSIS — I129 Hypertensive chronic kidney disease with stage 1 through stage 4 chronic kidney disease, or unspecified chronic kidney disease: Secondary | ICD-10-CM | POA: Diagnosis not present

## 2024-01-10 DIAGNOSIS — M47816 Spondylosis without myelopathy or radiculopathy, lumbar region: Secondary | ICD-10-CM | POA: Diagnosis not present

## 2024-01-10 DIAGNOSIS — M7061 Trochanteric bursitis, right hip: Secondary | ICD-10-CM | POA: Diagnosis not present

## 2024-01-10 DIAGNOSIS — I081 Rheumatic disorders of both mitral and tricuspid valves: Secondary | ICD-10-CM | POA: Diagnosis not present

## 2024-01-14 NOTE — Telephone Encounter (Signed)
 Left message to call office.  Patient also needs yearly follow up appointment scheduled.

## 2024-01-15 DIAGNOSIS — I2510 Atherosclerotic heart disease of native coronary artery without angina pectoris: Secondary | ICD-10-CM | POA: Diagnosis not present

## 2024-01-15 DIAGNOSIS — N1831 Chronic kidney disease, stage 3a: Secondary | ICD-10-CM | POA: Diagnosis not present

## 2024-01-15 DIAGNOSIS — I872 Venous insufficiency (chronic) (peripheral): Secondary | ICD-10-CM | POA: Diagnosis not present

## 2024-01-15 DIAGNOSIS — D72819 Decreased white blood cell count, unspecified: Secondary | ICD-10-CM | POA: Diagnosis not present

## 2024-01-15 DIAGNOSIS — M3500 Sicca syndrome, unspecified: Secondary | ICD-10-CM | POA: Diagnosis not present

## 2024-01-15 DIAGNOSIS — I129 Hypertensive chronic kidney disease with stage 1 through stage 4 chronic kidney disease, or unspecified chronic kidney disease: Secondary | ICD-10-CM | POA: Diagnosis not present

## 2024-01-15 DIAGNOSIS — M47816 Spondylosis without myelopathy or radiculopathy, lumbar region: Secondary | ICD-10-CM | POA: Diagnosis not present

## 2024-01-15 DIAGNOSIS — M7061 Trochanteric bursitis, right hip: Secondary | ICD-10-CM | POA: Diagnosis not present

## 2024-01-17 ENCOUNTER — Other Ambulatory Visit (HOSPITAL_BASED_OUTPATIENT_CLINIC_OR_DEPARTMENT_OTHER): Payer: Self-pay | Admitting: Cardiology

## 2024-01-17 DIAGNOSIS — R600 Localized edema: Secondary | ICD-10-CM

## 2024-01-17 DIAGNOSIS — M79661 Pain in right lower leg: Secondary | ICD-10-CM

## 2024-01-21 NOTE — Progress Notes (Unsigned)
 No chief complaint on file.  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 She has been followed by Dr. Hobart. She is known to have CAD and had PCI of the LAD and Diagonal in 2008. Nuclear stress test in 2022 with no ischemia. Cardiac monitor in 2017 with sinus brady and frequent PVCs. Echo July 2022 with LVEF=58%, grade 2 diastolic dysfunction, mild to moderate TR, mild to moderate AI. She was seen in September 2023 and had c/o chest pain and dyspnea as well as asymmetric calf tenderness and swelling. Venous doppler negative for DVT. Carotid artery dopplers August 2024 with no evidence of carotid artery disease. Echo August 2024 with LVEF=65-70%. Mild MR. Mild to moderate AI.   He is here today for follow up. The patient denies any chest pain, dyspnea, palpitations, lower extremity edema, orthopnea, PND, dizziness, near syncope or syncope.   Primary Care Physician: Royden Ronal Czar, FNP   Past Medical History:  Diagnosis Date   Aortic insufficiency    mild to moderate by echo 08/2020   Carotid artery dissection    a. remote LICA dissection.   CKD (chronic kidney disease), stage III (HCC)    Coronary artery disease    a. remote stents to LAD and large diagonal branch. b. PTCA of diag for ISR 2007. c. PTCA/DES to diagonal and PTCA of mid LAD 01/2007.   Fibromuscular dysplasia    GERD (gastroesophageal reflux disease)    Headache(784.0)    hx of   Heart murmur    Hyperlipidemia    Hypertension    Internal hemorrhoids without mention of complication    Lower extremity edema    Lower extremity edema    Mitral regurgitation    mild to moderate by echo 08/2020   Neutropenia, unspecified    Obesity    Premature atrial contractions    PVC's (premature ventricular contractions)    Sinus brady-tachy syndrome (HCC)     Stroke (HCC)    Tricuspid regurgitation    mild to moderate by echo 08/2020    Past Surgical History:  Procedure Laterality Date   ABDOMINAL HYSTERECTOMY     CARDIAC CATHETERIZATION  2008   Cardiac Stents  2008   COLONOSCOPY  2010   normal    gallstones removed     PARS PLANA VITRECTOMY  06/26/2011   Procedure: PARS PLANA VITRECTOMY WITH 23 GAUGE;  Surgeon: Jestine Bunnell, MD;  Location: [facility] OR;  Service: Ophthalmology;  Laterality: Left;    Current Outpatient Medications  Medication Sig Dispense Refill   atorvastatin  (LIPITOR) 40 MG tablet Take 1 tablet (40 mg total) by mouth daily. 90 tablet 3   clopidogrel  (PLAVIX ) 75 MG tablet TAKE 1 TABLET BY MOUTH EVERY DAY 90 tablet 1   furosemide  (LASIX ) 20 MG tablet Take 1 tablet (20 mg total) by mouth 3 (three) times a week. For lower extremity swelling. 30 tablet 2   pantoprazole  (PROTONIX ) 40 MG tablet Take 1 tablet (40 mg total) by mouth daily. 90 tablet 3   valsartan  (DIOVAN ) 80 MG tablet Take 1 tablet (80 mg total) by mouth daily. 90 tablet 3   Vitamin D, Ergocalciferol, (DRISDOL) 1.25 MG (50000 UNIT) CAPS capsule Take 50,000 Units by mouth once a week.     No current facility-administered medications for this visit.    Allergies  Allergen Reactions  Fenoprofen Calcium  Other (See Comments)    Stopped kidneys from functioning Stopped kidneys from functioning    Social History   Socioeconomic History   Marital status: Widowed    Spouse name: [place]   Number of children: 1   Years of education: 20   Highest education level: Not on file  Occupational History   Occupation: Retired  Tobacco Use   Smoking status: Never   Smokeless tobacco: Never  Vaping Use   Vaping status: Never Used  Substance and Sexual Activity   Alcohol use: No   Drug use: No   Sexual activity: Yes    Birth control/protection: Surgical  Other Topics Concern   Not on file  Social History Narrative   Lives with husband   Caffeine use: Tea rare    Social Drivers of Corporate Investment Banker Strain: Not on file  Food Insecurity: Not on file  Transportation Needs: Not on file  Physical Activity: Not on file  Stress: Not on file  Social Connections: Not on file  Intimate Partner Violence: Not on file    Family History  Problem Relation Age of Onset   Heart disease Mother    Colon cancer Mother    Heart attack Father    Breast cancer Sister        x 2   Anesthesia problems Neg Hx    Hypotension Neg Hx    Malignant hyperthermia Neg Hx    Pseudochol deficiency Neg Hx    Esophageal cancer Neg Hx    Rectal cancer Neg Hx    Stomach cancer Neg Hx     Review of Systems:  As stated in the HPI and otherwise negative.   There were no vitals taken for this visit.  Physical Examination: General: Well developed, well nourished, NAD  HEENT: OP clear, mucus membranes moist  SKIN: warm, dry. No rashes. Neuro: No focal deficits  Musculoskeletal: Muscle strength 5/5 all ext  Psychiatric: Mood and affect normal  Neck: No JVD, no carotid bruits, no thyromegaly, no lymphadenopathy.  Lungs:Clear bilaterally, no wheezes, rhonci, crackles Cardiovascular: Regular rate and rhythm. No murmurs, gallops or rubs. Abdomen:Soft. Bowel sounds present. Non-tender.  Extremities: No lower extremity edema. Pulses are 2 + in the bilateral DP/PT.  EKG:  EKG is *** ordered today. The ekg ordered today demonstrates   Recent Labs: No results found for requested labs within last 365 days.   Lipid Panel    Component Value Date/Time   CHOL 129 10/31/2018 0906   TRIG 59 10/31/2018 0906   HDL 55 10/31/2018 0906   CHOLHDL 2.3 10/31/2018 0906   CHOLHDL 2.3 07/07/2015 0913   VLDL 13 07/07/2015 0913   LDLCALC 61 10/31/2018 0906     Wt Readings from Last 3 Encounters:  12/12/22 206 lb 12.8 oz (93.8 kg)  05/10/22 210 lb (95.3 kg)  11/14/21 208 lb 3.2 oz (94.4 kg)    Assessment and Plan:   1. CAD without angina: No chest pain. Continue Plavix   and statin.    2. Aortic insufficiency: Mild to moderate by echo in August 2024. *** Will repeat echo in August 2026.   3. Mitral regurgitation: Mild by echo in August 2024.   4. Hyperlipidemia: Lipids followed in primary care. LDL ***. Continue statin.   5. Carotid artery dissection: Carotid dopplers August 2024 with no evidence of disease.   6. Sinus bradycardia: No dizziness.   Labs/ tests ordered today include:  No orders of the defined types  were placed in this encounter.  Disposition:   F/U with me in one year.    Signed, Lonni Cash, MD, [facility] 01/21/2024 1:18 PM    [facility] [address] [place], [place], [place]  [address] Phone: [phone]; Fax: [phone]

## 2024-01-22 ENCOUNTER — Ambulatory Visit: Attending: Cardiovascular Disease | Admitting: Cardiovascular Disease

## 2024-01-22 ENCOUNTER — Encounter: Payer: Self-pay | Admitting: Cardiovascular Disease

## 2024-01-22 VITALS — BP 120/72 | HR 49 | Ht 68.0 in | Wt 206.6 lb

## 2024-01-22 DIAGNOSIS — I2510 Atherosclerotic heart disease of native coronary artery without angina pectoris: Secondary | ICD-10-CM | POA: Diagnosis not present

## 2024-01-22 DIAGNOSIS — I5032 Chronic diastolic (congestive) heart failure: Secondary | ICD-10-CM

## 2024-01-22 DIAGNOSIS — I340 Nonrheumatic mitral (valve) insufficiency: Secondary | ICD-10-CM | POA: Diagnosis not present

## 2024-01-22 DIAGNOSIS — R001 Bradycardia, unspecified: Secondary | ICD-10-CM

## 2024-01-22 DIAGNOSIS — E782 Mixed hyperlipidemia: Secondary | ICD-10-CM

## 2024-01-22 DIAGNOSIS — I10 Essential (primary) hypertension: Secondary | ICD-10-CM | POA: Diagnosis not present

## 2024-01-22 DIAGNOSIS — I351 Nonrheumatic aortic (valve) insufficiency: Secondary | ICD-10-CM

## 2024-01-22 DIAGNOSIS — I7771 Dissection of carotid artery: Secondary | ICD-10-CM

## 2024-01-22 MED ORDER — FUROSEMIDE 20 MG PO TABS: 20.0000 mg | ORAL_TABLET | Freq: Every day | ORAL | 6 refills | Status: AC | PRN

## 2024-01-22 NOTE — Patient Instructions (Signed)
 Medication Instructions:  Your physician has recommended you make the following change in your medication:  Start furosemide  20 mg daily as needed for swelling  *If you need a refill on your cardiac medications before your next appointment, please call your pharmacy*  Lab Work: none If you have labs (blood work) drawn today and your tests are completely normal, you will receive your results only by: MyChart Message (if you have MyChart) OR A paper copy in the mail If you have any lab test that is abnormal or we need to change your treatment, we will call you to review the results.  Testing/Procedures: Your physician has requested that you have an echocardiogram in August 2026. Echocardiography is a painless test that uses sound waves to create images of your heart. It provides your doctor with information about the size and shape of your heart and how well your heart's chambers and valves are working. This procedure takes approximately one hour. There are no restrictions for this procedure. Please do NOT wear cologne, perfume, aftershave, or lotions (deodorant is allowed). Please arrive 15 minutes prior to your appointment time.  Please note: We ask at that you not bring children with you during ultrasound (echo/ vascular) testing. Due to room size and safety concerns, children are not allowed in the ultrasound rooms during exams. Our front office staff cannot provide observation of children in our lobby area while testing is being conducted. An adult accompanying a patient to their appointment will only be allowed in the ultrasound room at the discretion of the ultrasound technician under special circumstances. We apologize for any inconvenience.   Follow-Up: At [facility], you and your health needs are our priority.  As part of our continuing mission to provide you with exceptional heart care, our providers are all part of one team.  This team includes your primary Cardiologist  (physician) and Advanced Practice Providers or APPs (Physician Assistants and Nurse Practitioners) who all work together to provide you with the care you need, when you need it.  Your next appointment:   12 month(s)  Provider:   Lonni Cash, MD    We recommend signing up for the patient portal called MyChart.  Sign up information is provided on this After Visit Summary.  MyChart is used to connect with patients for Virtual Visits (Telemedicine).  Patients are able to view lab/test results, encounter notes, upcoming appointments, etc.  Non-urgent messages can be sent to your provider as well.   To learn more about what you can do with MyChart, go to [email or web address].   Other Instructions

## 2024-01-22 NOTE — Telephone Encounter (Signed)
 Patient reports handicap form was completed by her PCP

## 2024-01-30 ENCOUNTER — Other Ambulatory Visit (HOSPITAL_BASED_OUTPATIENT_CLINIC_OR_DEPARTMENT_OTHER): Payer: Self-pay | Admitting: Cardiology

## 2024-01-30 DIAGNOSIS — R600 Localized edema: Secondary | ICD-10-CM

## 2024-01-30 DIAGNOSIS — M79661 Pain in right lower leg: Secondary | ICD-10-CM

## 2024-10-01 ENCOUNTER — Ambulatory Visit (HOSPITAL_COMMUNITY)
